# Patient Record
Sex: Female | Born: 1960 | Race: White | Hispanic: No | Marital: Married | Smoking: Never smoker
Health system: Southern US, Community
[De-identification: ages and names within clinical notes are randomized; demographics above are authoritative.]

## PROBLEM LIST (undated history)

## (undated) DIAGNOSIS — Z923 Personal history of irradiation: Secondary | ICD-10-CM

## (undated) DIAGNOSIS — C50919 Malignant neoplasm of unspecified site of unspecified female breast: Secondary | ICD-10-CM

## (undated) DIAGNOSIS — E7800 Pure hypercholesterolemia, unspecified: Secondary | ICD-10-CM

## (undated) DIAGNOSIS — K219 Gastro-esophageal reflux disease without esophagitis: Secondary | ICD-10-CM

## (undated) DIAGNOSIS — Z8042 Family history of malignant neoplasm of prostate: Secondary | ICD-10-CM

## (undated) DIAGNOSIS — I10 Essential (primary) hypertension: Secondary | ICD-10-CM

## (undated) DIAGNOSIS — C539 Malignant neoplasm of cervix uteri, unspecified: Secondary | ICD-10-CM

## (undated) DIAGNOSIS — Z803 Family history of malignant neoplasm of breast: Secondary | ICD-10-CM

## (undated) HISTORY — DX: Family history of malignant neoplasm of breast: Z80.3

## (undated) HISTORY — PX: BREAST LUMPECTOMY: SHX2

## (undated) HISTORY — PX: EYE SURGERY: SHX253

## (undated) HISTORY — DX: Family history of malignant neoplasm of prostate: Z80.42

---
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
## 1988-12-07 HISTORY — PX: ABDOMINAL HYSTERECTOMY: SHX81

## 1999-09-23 ENCOUNTER — Encounter: Payer: Self-pay | Admitting: Obstetrics and Gynecology

## 1999-09-23 ENCOUNTER — Ambulatory Visit (HOSPITAL_COMMUNITY): Admission: RE | Admit: 1999-09-23 | Discharge: 1999-09-23 | Payer: Self-pay

## 1999-09-25 ENCOUNTER — Encounter: Payer: Self-pay | Admitting: Obstetrics and Gynecology

## 1999-09-25 ENCOUNTER — Ambulatory Visit (HOSPITAL_COMMUNITY): Admission: RE | Admit: 1999-09-25 | Discharge: 1999-09-25 | Payer: Self-pay

## 2000-07-29 ENCOUNTER — Ambulatory Visit (HOSPITAL_COMMUNITY): Admission: RE | Admit: 2000-07-29 | Discharge: 2000-07-29 | Payer: Self-pay

## 2001-05-05 ENCOUNTER — Encounter: Payer: Self-pay | Admitting: Obstetrics and Gynecology

## 2001-05-05 ENCOUNTER — Ambulatory Visit (HOSPITAL_COMMUNITY): Admission: RE | Admit: 2001-05-05 | Discharge: 2001-05-05 | Payer: Self-pay | Admitting: Obstetrics and Gynecology

## 2002-02-02 ENCOUNTER — Ambulatory Visit (HOSPITAL_COMMUNITY): Admission: RE | Admit: 2002-02-02 | Discharge: 2002-02-02 | Payer: Self-pay | Admitting: Obstetrics and Gynecology

## 2002-02-02 ENCOUNTER — Encounter: Payer: Self-pay | Admitting: Obstetrics and Gynecology

## 2003-03-08 ENCOUNTER — Ambulatory Visit (HOSPITAL_COMMUNITY): Admission: RE | Admit: 2003-03-08 | Discharge: 2003-03-08 | Payer: Self-pay | Admitting: Obstetrics and Gynecology

## 2003-03-08 ENCOUNTER — Encounter: Payer: Self-pay | Admitting: Obstetrics and Gynecology

## 2004-04-30 ENCOUNTER — Ambulatory Visit (HOSPITAL_COMMUNITY): Admission: RE | Admit: 2004-04-30 | Discharge: 2004-04-30 | Payer: Self-pay | Admitting: *Deleted

## 2005-05-27 ENCOUNTER — Ambulatory Visit (HOSPITAL_COMMUNITY): Admission: RE | Admit: 2005-05-27 | Discharge: 2005-05-27 | Payer: Self-pay | Admitting: *Deleted

## 2005-08-07 ENCOUNTER — Ambulatory Visit (HOSPITAL_COMMUNITY): Admission: RE | Admit: 2005-08-07 | Discharge: 2005-08-07 | Payer: Self-pay | Admitting: *Deleted

## 2006-07-15 ENCOUNTER — Ambulatory Visit (HOSPITAL_COMMUNITY): Admission: RE | Admit: 2006-07-15 | Discharge: 2006-07-15 | Payer: Self-pay

## 2007-08-18 ENCOUNTER — Ambulatory Visit (HOSPITAL_COMMUNITY): Admission: RE | Admit: 2007-08-18 | Discharge: 2007-08-18 | Payer: Self-pay | Admitting: *Deleted

## 2008-08-24 ENCOUNTER — Ambulatory Visit (HOSPITAL_COMMUNITY): Admission: RE | Admit: 2008-08-24 | Discharge: 2008-08-24 | Payer: Self-pay | Admitting: *Deleted

## 2009-09-10 ENCOUNTER — Ambulatory Visit (HOSPITAL_COMMUNITY): Admission: RE | Admit: 2009-09-10 | Discharge: 2009-09-10 | Payer: Self-pay | Admitting: *Deleted

## 2009-09-16 ENCOUNTER — Encounter: Admission: RE | Admit: 2009-09-16 | Discharge: 2009-09-16 | Payer: Self-pay | Admitting: *Deleted

## 2010-11-10 ENCOUNTER — Ambulatory Visit (HOSPITAL_COMMUNITY)
Admission: RE | Admit: 2010-11-10 | Discharge: 2010-11-10 | Payer: Self-pay | Source: Home / Self Care | Admitting: Obstetrics and Gynecology

## 2010-12-28 ENCOUNTER — Encounter: Payer: Self-pay | Admitting: Obstetrics and Gynecology

## 2012-01-06 ENCOUNTER — Other Ambulatory Visit (HOSPITAL_COMMUNITY): Payer: Self-pay | Admitting: Obstetrics and Gynecology

## 2012-01-06 DIAGNOSIS — Z1231 Encounter for screening mammogram for malignant neoplasm of breast: Secondary | ICD-10-CM

## 2012-02-03 ENCOUNTER — Ambulatory Visit (HOSPITAL_COMMUNITY)
Admission: RE | Admit: 2012-02-03 | Discharge: 2012-02-03 | Disposition: A | Discharge status: Home / Self Care | Payer: 59 | Source: Ambulatory Visit | Attending: Obstetrics and Gynecology | Admitting: Obstetrics and Gynecology

## 2012-02-03 DIAGNOSIS — Z1231 Encounter for screening mammogram for malignant neoplasm of breast: Secondary | ICD-10-CM | POA: Insufficient documentation

## 2012-02-05 ENCOUNTER — Other Ambulatory Visit: Payer: Self-pay | Admitting: Obstetrics and Gynecology

## 2012-02-05 DIAGNOSIS — R928 Other abnormal and inconclusive findings on diagnostic imaging of breast: Secondary | ICD-10-CM

## 2012-02-16 ENCOUNTER — Ambulatory Visit
Admission: RE | Admit: 2012-02-16 | Discharge: 2012-02-16 | Disposition: A | Discharge status: Home / Self Care | Payer: 59 | Source: Ambulatory Visit | Attending: Obstetrics and Gynecology | Admitting: Obstetrics and Gynecology

## 2012-02-16 DIAGNOSIS — R928 Other abnormal and inconclusive findings on diagnostic imaging of breast: Secondary | ICD-10-CM

## 2013-03-07 ENCOUNTER — Other Ambulatory Visit (HOSPITAL_COMMUNITY): Payer: Self-pay | Admitting: Obstetrics and Gynecology

## 2013-03-07 DIAGNOSIS — Z1231 Encounter for screening mammogram for malignant neoplasm of breast: Secondary | ICD-10-CM

## 2013-03-10 ENCOUNTER — Ambulatory Visit (HOSPITAL_COMMUNITY)
Admission: RE | Admit: 2013-03-10 | Discharge: 2013-03-10 | Disposition: A | Discharge status: Home / Self Care | Payer: 59 | Source: Ambulatory Visit | Attending: Obstetrics and Gynecology | Admitting: Obstetrics and Gynecology

## 2013-03-10 DIAGNOSIS — Z1231 Encounter for screening mammogram for malignant neoplasm of breast: Secondary | ICD-10-CM | POA: Insufficient documentation

## 2014-03-12 ENCOUNTER — Other Ambulatory Visit (HOSPITAL_COMMUNITY): Payer: Self-pay | Admitting: Internal Medicine

## 2014-03-12 ENCOUNTER — Ambulatory Visit (HOSPITAL_COMMUNITY)
Admission: RE | Admit: 2014-03-12 | Discharge: 2014-03-12 | Disposition: A | Discharge status: Home / Self Care | Payer: BC Managed Care – PPO | Source: Ambulatory Visit | Attending: Internal Medicine | Admitting: Internal Medicine

## 2014-03-12 DIAGNOSIS — Z1231 Encounter for screening mammogram for malignant neoplasm of breast: Secondary | ICD-10-CM | POA: Insufficient documentation

## 2014-03-12 DIAGNOSIS — Z0000 Encounter for general adult medical examination without abnormal findings: Secondary | ICD-10-CM

## 2015-03-18 ENCOUNTER — Other Ambulatory Visit (HOSPITAL_COMMUNITY): Payer: Self-pay | Admitting: Internal Medicine

## 2015-03-18 DIAGNOSIS — Z1231 Encounter for screening mammogram for malignant neoplasm of breast: Secondary | ICD-10-CM

## 2015-03-22 ENCOUNTER — Ambulatory Visit (HOSPITAL_COMMUNITY)
Admission: RE | Admit: 2015-03-22 | Discharge: 2015-03-22 | Disposition: A | Discharge status: Home / Self Care | Payer: BLUE CROSS/BLUE SHIELD | Source: Ambulatory Visit | Attending: Internal Medicine | Admitting: Internal Medicine

## 2015-03-22 DIAGNOSIS — Z1231 Encounter for screening mammogram for malignant neoplasm of breast: Secondary | ICD-10-CM | POA: Diagnosis not present

## 2015-04-09 ENCOUNTER — Other Ambulatory Visit: Payer: Self-pay | Admitting: Registered Nurse

## 2015-04-09 ENCOUNTER — Other Ambulatory Visit (HOSPITAL_COMMUNITY)
Admission: RE | Admit: 2015-04-09 | Discharge: 2015-04-09 | Disposition: A | Discharge status: Home / Self Care | Payer: BLUE CROSS/BLUE SHIELD | Source: Ambulatory Visit | Attending: Internal Medicine | Admitting: Internal Medicine

## 2015-04-09 DIAGNOSIS — Z01419 Encounter for gynecological examination (general) (routine) without abnormal findings: Secondary | ICD-10-CM | POA: Insufficient documentation

## 2015-04-11 LAB — CYTOLOGY - PAP

## 2016-03-12 ENCOUNTER — Other Ambulatory Visit: Payer: Self-pay

## 2016-03-12 DIAGNOSIS — Z1231 Encounter for screening mammogram for malignant neoplasm of breast: Secondary | ICD-10-CM

## 2016-04-07 ENCOUNTER — Ambulatory Visit
Admission: RE | Admit: 2016-04-07 | Discharge: 2016-04-07 | Disposition: A | Discharge status: Home / Self Care | Payer: BLUE CROSS/BLUE SHIELD | Source: Ambulatory Visit

## 2016-04-07 DIAGNOSIS — Z1231 Encounter for screening mammogram for malignant neoplasm of breast: Secondary | ICD-10-CM

## 2016-04-20 DIAGNOSIS — B354 Tinea corporis: Secondary | ICD-10-CM | POA: Diagnosis not present

## 2016-06-18 DIAGNOSIS — Z0001 Encounter for general adult medical examination with abnormal findings: Secondary | ICD-10-CM | POA: Diagnosis not present

## 2016-06-24 DIAGNOSIS — B354 Tinea corporis: Secondary | ICD-10-CM | POA: Diagnosis not present

## 2016-06-24 DIAGNOSIS — E7800 Pure hypercholesterolemia, unspecified: Secondary | ICD-10-CM | POA: Diagnosis not present

## 2016-06-24 DIAGNOSIS — Z8541 Personal history of malignant neoplasm of cervix uteri: Secondary | ICD-10-CM | POA: Diagnosis not present

## 2016-06-24 DIAGNOSIS — Z0000 Encounter for general adult medical examination without abnormal findings: Secondary | ICD-10-CM | POA: Diagnosis not present

## 2016-07-08 ENCOUNTER — Other Ambulatory Visit (HOSPITAL_COMMUNITY)
Admission: RE | Admit: 2016-07-08 | Discharge: 2016-07-08 | Disposition: A | Discharge status: Home / Self Care | Payer: BLUE CROSS/BLUE SHIELD | Source: Ambulatory Visit | Attending: Internal Medicine | Admitting: Internal Medicine

## 2016-07-08 ENCOUNTER — Other Ambulatory Visit: Payer: Self-pay | Admitting: Registered Nurse

## 2016-07-08 DIAGNOSIS — Z01419 Encounter for gynecological examination (general) (routine) without abnormal findings: Secondary | ICD-10-CM | POA: Diagnosis not present

## 2016-07-08 DIAGNOSIS — Z1212 Encounter for screening for malignant neoplasm of rectum: Secondary | ICD-10-CM | POA: Diagnosis not present

## 2016-07-08 DIAGNOSIS — Z01411 Encounter for gynecological examination (general) (routine) with abnormal findings: Secondary | ICD-10-CM | POA: Insufficient documentation

## 2016-07-08 DIAGNOSIS — Z79890 Hormone replacement therapy (postmenopausal): Secondary | ICD-10-CM | POA: Diagnosis not present

## 2016-07-08 DIAGNOSIS — Z8541 Personal history of malignant neoplasm of cervix uteri: Secondary | ICD-10-CM | POA: Diagnosis not present

## 2016-07-15 LAB — CYTOLOGY - PAP

## 2016-08-27 DIAGNOSIS — E7800 Pure hypercholesterolemia, unspecified: Secondary | ICD-10-CM | POA: Diagnosis not present

## 2016-09-18 DIAGNOSIS — Z23 Encounter for immunization: Secondary | ICD-10-CM | POA: Diagnosis not present

## 2016-12-07 DIAGNOSIS — Z923 Personal history of irradiation: Secondary | ICD-10-CM

## 2016-12-07 DIAGNOSIS — Z9221 Personal history of antineoplastic chemotherapy: Secondary | ICD-10-CM

## 2016-12-07 HISTORY — DX: Personal history of antineoplastic chemotherapy: Z92.21

## 2016-12-07 HISTORY — DX: Personal history of irradiation: Z92.3

## 2017-02-04 DIAGNOSIS — I10 Essential (primary) hypertension: Secondary | ICD-10-CM | POA: Diagnosis not present

## 2017-02-04 DIAGNOSIS — B009 Herpesviral infection, unspecified: Secondary | ICD-10-CM | POA: Diagnosis not present

## 2017-03-03 ENCOUNTER — Other Ambulatory Visit: Payer: Self-pay | Admitting: Internal Medicine

## 2017-03-03 DIAGNOSIS — Z1231 Encounter for screening mammogram for malignant neoplasm of breast: Secondary | ICD-10-CM

## 2017-03-04 DIAGNOSIS — I10 Essential (primary) hypertension: Secondary | ICD-10-CM | POA: Diagnosis not present

## 2017-04-06 DIAGNOSIS — C50919 Malignant neoplasm of unspecified site of unspecified female breast: Secondary | ICD-10-CM

## 2017-04-06 HISTORY — DX: Malignant neoplasm of unspecified site of unspecified female breast: C50.919

## 2017-04-07 DIAGNOSIS — F419 Anxiety disorder, unspecified: Secondary | ICD-10-CM | POA: Diagnosis not present

## 2017-04-07 DIAGNOSIS — E7800 Pure hypercholesterolemia, unspecified: Secondary | ICD-10-CM | POA: Diagnosis not present

## 2017-04-07 DIAGNOSIS — L237 Allergic contact dermatitis due to plants, except food: Secondary | ICD-10-CM | POA: Diagnosis not present

## 2017-04-07 DIAGNOSIS — I10 Essential (primary) hypertension: Secondary | ICD-10-CM | POA: Diagnosis not present

## 2017-04-08 ENCOUNTER — Ambulatory Visit
Admission: RE | Admit: 2017-04-08 | Discharge: 2017-04-08 | Disposition: A | Discharge status: Home / Self Care | Payer: BLUE CROSS/BLUE SHIELD | Source: Ambulatory Visit | Attending: Internal Medicine | Admitting: Internal Medicine

## 2017-04-08 DIAGNOSIS — Z1231 Encounter for screening mammogram for malignant neoplasm of breast: Secondary | ICD-10-CM | POA: Diagnosis not present

## 2017-04-09 ENCOUNTER — Other Ambulatory Visit: Payer: Self-pay | Admitting: Internal Medicine

## 2017-04-09 DIAGNOSIS — R928 Other abnormal and inconclusive findings on diagnostic imaging of breast: Secondary | ICD-10-CM

## 2017-04-14 ENCOUNTER — Ambulatory Visit
Admission: RE | Admit: 2017-04-14 | Discharge: 2017-04-14 | Disposition: A | Discharge status: Home / Self Care | Payer: BLUE CROSS/BLUE SHIELD | Source: Ambulatory Visit | Attending: Internal Medicine | Admitting: Internal Medicine

## 2017-04-14 ENCOUNTER — Other Ambulatory Visit: Payer: Self-pay | Admitting: Internal Medicine

## 2017-04-14 DIAGNOSIS — R922 Inconclusive mammogram: Secondary | ICD-10-CM | POA: Diagnosis not present

## 2017-04-14 DIAGNOSIS — R590 Localized enlarged lymph nodes: Secondary | ICD-10-CM | POA: Diagnosis not present

## 2017-04-14 DIAGNOSIS — N6489 Other specified disorders of breast: Secondary | ICD-10-CM | POA: Diagnosis not present

## 2017-04-14 DIAGNOSIS — R599 Enlarged lymph nodes, unspecified: Secondary | ICD-10-CM

## 2017-04-14 DIAGNOSIS — R928 Other abnormal and inconclusive findings on diagnostic imaging of breast: Secondary | ICD-10-CM

## 2017-04-14 DIAGNOSIS — C801 Malignant (primary) neoplasm, unspecified: Secondary | ICD-10-CM | POA: Diagnosis not present

## 2017-04-14 DIAGNOSIS — C773 Secondary and unspecified malignant neoplasm of axilla and upper limb lymph nodes: Secondary | ICD-10-CM | POA: Diagnosis not present

## 2017-04-20 ENCOUNTER — Other Ambulatory Visit: Payer: Self-pay | Admitting: General Surgery

## 2017-04-20 DIAGNOSIS — C773 Secondary and unspecified malignant neoplasm of axilla and upper limb lymph nodes: Principal | ICD-10-CM

## 2017-04-20 DIAGNOSIS — C50912 Malignant neoplasm of unspecified site of left female breast: Secondary | ICD-10-CM

## 2017-04-21 ENCOUNTER — Encounter: Payer: Self-pay | Admitting: Hematology and Oncology

## 2017-04-21 ENCOUNTER — Ambulatory Visit (HOSPITAL_BASED_OUTPATIENT_CLINIC_OR_DEPARTMENT_OTHER): Payer: BLUE CROSS/BLUE SHIELD | Admitting: Hematology and Oncology

## 2017-04-21 DIAGNOSIS — C773 Secondary and unspecified malignant neoplasm of axilla and upper limb lymph nodes: Secondary | ICD-10-CM

## 2017-04-21 DIAGNOSIS — C50912 Malignant neoplasm of unspecified site of left female breast: Secondary | ICD-10-CM

## 2017-04-21 DIAGNOSIS — C50919 Malignant neoplasm of unspecified site of unspecified female breast: Secondary | ICD-10-CM | POA: Diagnosis not present

## 2017-04-21 DIAGNOSIS — Z170 Estrogen receptor positive status [ER+]: Secondary | ICD-10-CM | POA: Diagnosis not present

## 2017-04-21 MED ORDER — ANASTROZOLE 1 MG PO TABS: 1.0000 mg | ORAL_TABLET | Freq: Every day | ORAL | 3 refills | Status: DC

## 2017-04-21 NOTE — Assessment & Plan Note (Signed)
04/14/2017 Left axillary lymph node biopsy: Metastatic carcinoma positive for CK 7, ER positive,GATA-3 equivocal, negative for CK 20,GCDFP, CDX-2, TTF-1; mammogram and ultrasound revealed 3 discrete abnormal left axillary lymph nodes largest measures 1.8 cm Clinical stage: TX N1 MX Pathology and radiology counseling: Discussed with the patient, the details of pathology including the type of breast cancer,the clinical staging, the significance of ER, PR and HER-2/neu receptors and the implications for treatment. After reviewing the pathology in detail, we proceeded to discuss the different treatment options between surgery, radiation, chemotherapy, antiestrogen therapies.  Recommendation: Will perform staging CT and bone scans 1. Breast MRI to be done 04/22/2017 2. await the results of complete breast prognostic panel 3. Surgery either with lumpectomy or mastectomy depending on MRI 4. Followed by Mammaprint testing if HER-2 negative 5. Followed by adjuvant radiation 6. Followed by adjuvant antiestrogen therapy   We discussed the role of neoadjuvant versus adjuvant chemotherapy. We will discuss this in the tumor board after reviewing the breast MRI and then come up with the final treatment plan.

## 2017-04-21 NOTE — Progress Notes (Signed)
Location of Breast Cancer: Left Axillary Lymph node  Histology per Pathology Report: 04/14/2017 Left axillary lymph node biopsy: Metastatic carcinoma positive for CK 7, ER positive,GATA-3 equivocal, negative for CK 20,GCDFP, CDX-2, TTF-1; mammogram and ultrasound revealed 3 discrete abnormal left axillary lymph nodes largest measures 1.8 cm Clinical stage: TX N1 MX  04/14/17 Diagnosis Lymph node, needle/core biopsy, left axillary - METASTATIC CARCINOMA.  Receptor Status: ER(POS), PR (), Her2-neu (), Ki-()  Did patient present with symptoms or was this found on screening mammography?: The lymph node was found on a screening mammogram.   Past/Anticipated interventions by surgeon, if any: Dr. Donne Hazel   Past/Anticipated interventions by medical oncology, if any: Dr. Lindi Adie 04/21/17 Recommendation: Will perform staging CT and bone scans 1. Breast MRI to be done 04/22/2017 2. await the results of complete breast prognostic panel 3. Surgery either with lumpectomy or mastectomy depending on MRI 4. Followed by Mammaprint testing if HER-2 negative 5. Followed by adjuvant radiation 6. Followed by adjuvant antiestrogen therapy   If however Dr. Donne Hazel request neoadjuvant treatment, we can do the Mammaprint on the biopsy and then determine if she needs chemotherapy or antiestrogen therapy upfront. We discussed the role of neoadjuvant versus adjuvant chemotherapy. Because it will take at least with 3 weeks to surgery, I started the patient on anastrozole 1 mg daily from today. I sent a prescription to her pharmacy. We discussed the risks and benefits of anastrozole therapy including hot flashes or myalgias as well as risk of osteoporosis mood swings and vaginal dryness.  We will discuss this in the tumor board after reviewing the breast MRI and then come up with the final treatment plan.   Lymphedema issues, if any:  N/A  Pain issues, if any: She denies.   SAFETY ISSUES:  Prior  radiation? no  Pacemaker/ICD? No  Possible current pregnancy? No  Is the patient on methotrexate? No  Current Complaints / other details:   MRI Breast bilateral 04/22/17  CT abdomen, pelvis and chest 04/26/17 Bone scan 04/30/17  Patient took estrogen replacement therapy for the past 8 years or so since she started getting menopausal symptoms at age 74. She quit taking it couple of months ago.  BP (!) 154/85   Pulse 86   Temp 97.6 F (36.4 C)   Wt 159 lb 6.4 oz (72.3 kg)   SpO2 96% Comment: room air  BMI 24.24 kg/m    Wt Readings from Last 3 Encounters:  04/23/17 159 lb 6.4 oz (72.3 kg)  04/21/17 161 lb 8 oz (73.3 kg)      Melissa [name], Stephani Police, RN 04/21/2017,9:14 AM

## 2017-04-21 NOTE — Progress Notes (Addendum)
[facility] CONSULT NOTE  Patient Care Team: Deland Pretty, MD as PCP - General (Internal Medicine)  CHIEF COMPLAINTS/PURPOSE OF CONSULTATION:  Newly diagnosed breast cancer  HISTORY OF PRESENTING ILLNESS:  Melissa [name] 56 y.o. female is here because of recent diagnosis of left axillary lymph node metastases most likely breast origin. Patient presented for a routine screening mammogram the detected these left axillary lymph nodes. She then underwent ultrasound and biopsy which came back as metastatic carcinoma that is breast origin. The initial mammogram and ultrasound did not show anything in the breast. She is scheduled to undergo breast MRI tomorrow. Patient has been going through a normal stress recently. She was dealing with some aliasing in loss as well as her daughter having had pregnancy loss. She was going through menopausal symptoms with severe hot flashes and difficulty with sleeping. Her primary care physician had prescribed Effexor but she has not started it yet. She saw Dr. Donne Hazel who has ordered a breast MRI tomorrow and we will be presenting her case in the tumor board next Wednesday. Patient took estrogen replacement therapy for the past 8 years or so since she started getting menopausal symptoms at age 76. She quit taking it couple of months ago. I reviewed her records extensively and collaborated the history with the patient.  SUMMARY OF ONCOLOGIC HISTORY:   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[place])   04/14/2017 Initial Diagnosis    Left axillary lymph node biopsy: Metastatic carcinoma positive for CK 7, ER positive,GATA-3 equivocal, negative for CK 20,GCDFP, CDX-2, TTF-1; HER-2 positive ratio 2.08; mammogram and ultrasound revealed 3 discrete abnormal left axillary lymph nodes largest measures 1.8 cm      04/23/2017 Breast MRI    Left axillary lymphadenopathy numerous enlarged level I axillary lymph nodes  largest 2.1 cm, no other abdominal masses in the left breast itself, no MRI evidence of malignancy in the right breast      04/26/2017 Imaging    CT CAP: Prominent left axillary lymph nodes no distant metastases, sclerotic focus left pedicle of T9 vertebra favored benign, T5 vertebral hemangioma       MEDICAL HISTORY: Anxiety, menopausal disorder with severe hot flashes SURGICAL HISTORY:No prior surgeries SOCIAL HISTORY:Denies any tobacco or alcohol or recreational drug use. She has occasional alcohol use history. FAMILY HISTORY:No breast cancer or ovarian cancer in the family ALLERGIES: No known drug allergies MEDICATIONS: Triamterene-HCTZ 30 7. 5-25 milligrams, rosuvastatin 10 mg daily, vitamin D, calcium Current Outpatient Prescriptions  Medication Sig Dispense Refill  . ALPRAZolam (XANAX) 0.5 MG tablet Take 0.5 mg by mouth 3 (three) times daily.  0  . anastrozole (ARIMIDEX) 1 MG tablet Take 1 tablet (1 mg total) by mouth daily. 90 tablet 3  . calcium carbonate (OS-CAL) 1250 (500 Ca) MG chewable tablet Chew 1 tablet by mouth daily. Calcium 600 mg    . cholecalciferol (VITAMIN D) 1000 units tablet Take 1,000 Units by mouth daily. 2000 iu    . dexamethasone (DECADRON) 4 MG tablet Take 1 tablet (4 mg total) by mouth 2 (two) times daily. Take 1 tab day before chemo and 1 tab day after chemo. 30 tablet 1  . lidocaine-prilocaine (EMLA) cream Apply to affected area once 30 g 3  . LORazepam (ATIVAN) 0.5 MG tablet Take 1 tablet (0.5 mg total) by mouth at bedtime. 30 tablet 0  . ondansetron (ZOFRAN) 8 MG tablet Take 1 tablet (8 mg total) by mouth 2 (two)  times daily as needed for refractory nausea / vomiting. Start on day 3 after chemo. 30 tablet 1  . predniSONE (STERAPRED UNI-PAK 21 TAB) 10 MG (21) TBPK tablet   0  . prochlorperazine (COMPAZINE) 10 MG tablet Take 1 tablet (10 mg total) by mouth every 6 (six) hours as needed (Nausea or vomiting). 30 tablet 1  . rosuvastatin (CRESTOR) 10 MG tablet    0  . triamterene-hydrochlorothiazide (MAXZIDE-25) 37.5-25 MG tablet take 1/2 to 1 tablet by mouth daily IN THE MORNING  0  . valACYclovir (VALTREX) 1000 MG tablet take 2 tablets by mouth every 12 hours if needed for 10 days  0  . venlafaxine XR (EFFEXOR-XR) 37.5 MG 24 hr capsule TK 1 C PO QD WF  0   No current facility-administered medications for this visit.     REVIEW OF SYSTEMS:   Constitutional: Denies fevers, chills or abnormal night sweats Eyes: Denies blurriness of vision, double vision or watery eyes Ears, nose, mouth, throat, and face: Denies mucositis or sore throat Respiratory: Denies cough, dyspnea or wheezes Cardiovascular: Denies palpitation, chest discomfort or lower extremity swelling Gastrointestinal:  Denies nausea, heartburn or change in bowel habits Skin: Denies abnormal skin rashes Lymphatics: Denies new lymphadenopathy or easy bruising Neurological:Denies numbness, tingling or new weaknesses Behavioral/Psych: Anxiety and stress  Breast:  Denies any palpable lumps or discharge All other systems were reviewed with the patient and are negative.  PHYSICAL EXAMINATION: ECOG PERFORMANCE STATUS: 1 - Symptomatic but completely ambulatory  Vitals:   04/21/17 1455  BP: (!) 144/77  Pulse: 75  Resp: 18  Temp: 97.8 F (36.6 C)   Filed Weights   04/21/17 1455  Weight: 161 lb 8 oz (73.3 kg)    GENERAL:alert, no distress and comfortable SKIN: skin color, texture, turgor are normal, no rashes or significant lesions EYES: normal, conjunctiva are pink and non-injected, sclera clear OROPHARYNX:no exudate, no erythema and lips, buccal mucosa, and tongue normal  NECK: supple, thyroid normal size, non-tender, without nodularity LYMPH:  no palpable lymphadenopathy in the cervical, axillary or inguinal LUNGS: clear to auscultation and percussion with normal breathing effort HEART: regular rate & rhythm and no murmurs and no lower extremity edema ABDOMEN:abdomen soft,  non-tender and normal bowel sounds Musculoskeletal:no cyanosis of digits and no clubbing  PSYCH: alert & oriented x 3 with fluent speech NEURO: no focal motor/sensory deficits BREAST: No palpable nodules in breast. Palpable axillary lymph nodes (exam performed in the presence of a chaperone)   RADIOGRAPHIC STUDIES: I have personally reviewed the radiological reports and agreed with the findings in the report.  ASSESSMENT AND PLAN:  Breast cancer, left breast ([place]) 04/14/2017 Left axillary lymph node biopsy: Metastatic carcinoma positive for CK 7, ER positive,GATA-3 equivocal, negative for CK 20,GCDFP, CDX-2, TTF-1; mammogram and ultrasound revealed 3 discrete abnormal left axillary lymph nodes largest measures 1.8 cm Clinical stage: TX N1 MX Pathology and radiology counseling: Discussed with the patient, the details of pathology including the type of breast cancer,the clinical staging, the significance of ER, PR and HER-2/neu receptors and the implications for treatment. After reviewing the pathology in detail, we proceeded to discuss the different treatment options between surgery, radiation, chemotherapy, antiestrogen therapies.  Recommendation: Will perform staging CT and bone scans 1. Breast MRI to be done 04/22/2017 2. await the results of complete breast prognostic panel 3. Surgery either with lumpectomy or mastectomy depending on MRI 4. Followed by Mammaprint testing if HER-2 negative 5. Followed by adjuvant radiation 6. Followed by  adjuvant antiestrogen therapy   If however Dr. Donne Hazel request neoadjuvant treatment, we can do the Mammaprint on the biopsy and then determine if she needs chemotherapy or antiestrogen therapy upfront. We discussed the role of neoadjuvant versus adjuvant chemotherapy. Because it will take at least with 3 weeks to surgery, I started the patient on anastrozole 1 mg daily from today. I sent a prescription to her pharmacy. We discussed the risks and  benefits of anastrozole therapy including hot flashes or myalgias as well as risk of osteoporosis mood swings and vaginal dryness.  We will discuss this in the tumor board after reviewing the breast MRI and then come up with the final treatment plan.  All questions were answered. The patient knows to call the clinic with any problems, questions or concerns.    Rulon Eisenmenger, MD 05/12/17   ADDENDUM: The addendum is to state that the pathology report confirmed metastatic carcinoma but the MRI did not confirm a breast primary. However because of the location of the lymph node which is in the axilla and it being ER positive and HER-2 positive ER extremely confident that this is most likely breast origin. Because of the above issues, I changed the diagnosis to secondary and unspecified malignant neoplasm of the axilla and upper limb lymph nodes (ICD code C 77.3) as well as malignant neoplasm of unspecified site of unspecified female breast (ICD code C 15.919) The treatment of [place] is based upon NCCN guidelines for node positive HER-2 positive breast neoplasm to be given as neoadjuvant chemotherapy. This establishes her stage as stage II a clinical staging.

## 2017-04-22 ENCOUNTER — Other Ambulatory Visit: Payer: Self-pay | Admitting: Emergency Medicine

## 2017-04-22 ENCOUNTER — Ambulatory Visit
Admission: RE | Admit: 2017-04-22 | Discharge: 2017-04-22 | Disposition: A | Discharge status: Home / Self Care | Payer: BLUE CROSS/BLUE SHIELD | Source: Ambulatory Visit | Attending: General Surgery | Admitting: General Surgery

## 2017-04-22 DIAGNOSIS — C50912 Malignant neoplasm of unspecified site of left female breast: Secondary | ICD-10-CM

## 2017-04-22 DIAGNOSIS — C773 Secondary and unspecified malignant neoplasm of axilla and upper limb lymph nodes: Principal | ICD-10-CM

## 2017-04-22 DIAGNOSIS — N6489 Other specified disorders of breast: Secondary | ICD-10-CM | POA: Diagnosis not present

## 2017-04-22 MED ORDER — GADOBENATE DIMEGLUMINE 529 MG/ML IV SOLN
15.0000 mL | Freq: Once | INTRAVENOUS | Status: AC | PRN
  Administered 2017-04-22: 15 mL via INTRAVENOUS

## 2017-04-23 ENCOUNTER — Ambulatory Visit
Admission: RE | Admit: 2017-04-23 | Discharge: 2017-04-23 | Disposition: A | Discharge status: Home / Self Care | Payer: BLUE CROSS/BLUE SHIELD | Source: Ambulatory Visit | Attending: Radiation Oncology | Admitting: Radiation Oncology

## 2017-04-23 ENCOUNTER — Encounter: Payer: Self-pay | Admitting: Radiation Oncology

## 2017-04-23 ENCOUNTER — Telehealth: Payer: Self-pay | Admitting: *Deleted

## 2017-04-23 VITALS — BP 154/85 | HR 86 | Temp 97.6°F | Wt 159.4 lb

## 2017-04-23 DIAGNOSIS — C50912 Malignant neoplasm of unspecified site of left female breast: Secondary | ICD-10-CM | POA: Diagnosis not present

## 2017-04-23 DIAGNOSIS — C773 Secondary and unspecified malignant neoplasm of axilla and upper limb lymph nodes: Secondary | ICD-10-CM | POA: Diagnosis not present

## 2017-04-23 DIAGNOSIS — Z8541 Personal history of malignant neoplasm of cervix uteri: Secondary | ICD-10-CM | POA: Insufficient documentation

## 2017-04-23 DIAGNOSIS — I10 Essential (primary) hypertension: Secondary | ICD-10-CM | POA: Diagnosis not present

## 2017-04-23 DIAGNOSIS — R21 Rash and other nonspecific skin eruption: Secondary | ICD-10-CM | POA: Diagnosis not present

## 2017-04-23 DIAGNOSIS — E7800 Pure hypercholesterolemia, unspecified: Secondary | ICD-10-CM | POA: Diagnosis not present

## 2017-04-23 DIAGNOSIS — C801 Malignant (primary) neoplasm, unspecified: Secondary | ICD-10-CM | POA: Diagnosis not present

## 2017-04-23 HISTORY — DX: Malignant neoplasm of cervix uteri, unspecified: C53.9

## 2017-04-23 HISTORY — DX: Pure hypercholesterolemia, unspecified: E78.00

## 2017-04-23 HISTORY — DX: Essential (primary) hypertension: I10

## 2017-04-23 NOTE — Telephone Encounter (Signed)
Left message for a return phone call to follow up after new patient appointment and to give appt for bone scan

## 2017-04-23 NOTE — Progress Notes (Signed)
Radiation Oncology         ([phone]) [phone] ________________________________  Initial outpatient Consultation  Name: Melissa [name] MRN: [id number]  Date: 04/23/2017  DOB: 1961/07/30  PZ:WCHEN, Thayer Jew, MD  Rolm Bookbinder, MD   REFERRING PHYSICIAN: Rolm Bookbinder, MD  DIAGNOSIS:    ICD-9-CM ICD-10-CM   1. Malignant neoplasm of left female breast, unspecified estrogen receptor status, unspecified site of breast ([place]) 174.9 C50.912 Ambulatory referral to Social Work  2. Secondary malignancy of axillary node ([facility]) 196.3 C77.3    Stage Tx N1 Mx Left Axillary Metastatic Carcinoma, presumed breast primary , ER 40% Positive / PR Negative / Her2 Positive by FISH  CHIEF COMPLAINT: Here to discuss management of left axillary lymph nodes positive for metastatic carcinoma, likely of breast primary  HISTORY OF PRESENT ILLNESS::Melissa [name] is a 56 y.o. female who presented for routine screening mammogram which detected 3 abnormal left axillary lymph nodes. She never felt a lump before this. She subsequently underwent ultrasound and biopsy of the left axillary lymph nodes on 04/14/17, which revealed metastatic carcinoma strongly favoring breast primary. This was ER 40% positive, weak staining. It was PR negative.  The patient underwent breast MRI for staging purposes on 04/22/17. The results are not posted at this time.  The patient is scheduled to be presented at tumor board on 04/28/17. The patient is also scheduled for CT staging on 5/21, bone scan on 5/25, and sees Dr. Lindi Adie on 5/25.  The patient presents to the clinic today to discuss the role that radiation therapy may play in the treatment of her disease. She is accompanied today by a close friend.   On review of systems, the patient denies pain. She denies headaches or abnormal extremity weakness. She reports regular bowel and urinary activity. She rpeorts on ongoing rash across her back, abdomen, and legs from poison sumac for which she  is currently taking Prednisone.  Of note, the patient took estrogen replacement therapy for the past 8 years since she began having menopausal symptoms at age 8. She stopped taking this several months ago. The patient had hysterectomy at age 76 due to very early stage endometrial cancer. She has a family history of breast cancer in her maternal aunt, post menopause.  PREVIOUS RADIATION THERAPY: No  PAST MEDICAL HISTORY:  has a past medical history of Cervical cancer ([place]); Hypercholesteremia; and Hypertension.    PAST SURGICAL HISTORY: Past Surgical History:  Procedure Laterality Date  . ABDOMINAL HYSTERECTOMY  1990    FAMILY HISTORY: family history is not on file.  SOCIAL HISTORY:  reports that she has never smoked. She has never used smokeless tobacco. She reports that she drinks alcohol. She reports that she does not use drugs.  ALLERGIES: Patient has no known allergies.  MEDICATIONS:  Current Outpatient Prescriptions  Medication Sig Dispense Refill  . ALPRAZolam (XANAX) 0.5 MG tablet Take 0.5 mg by mouth 3 (three) times daily.  0  . anastrozole (ARIMIDEX) 1 MG tablet Take 1 tablet (1 mg total) by mouth daily. 90 tablet 3  . calcium carbonate (OS-CAL) 1250 (500 Ca) MG chewable tablet Chew 1 tablet by mouth daily. Calcium 600 mg    . cholecalciferol (VITAMIN D) 1000 units tablet Take 1,000 Units by mouth daily. 2000 iu    . predniSONE (STERAPRED UNI-PAK 21 TAB) 10 MG (21) TBPK tablet   0  . rosuvastatin (CRESTOR) 10 MG tablet   0  . triamterene-hydrochlorothiazide (MAXZIDE-25) 37.5-25 MG tablet take 1/2 to 1 tablet  by mouth daily IN THE MORNING  0  . valACYclovir (VALTREX) 1000 MG tablet take 2 tablets by mouth every 12 hours if needed for 10 days  0  . venlafaxine XR (EFFEXOR-XR) 37.5 MG 24 hr capsule TK 1 C PO QD WF  0   No current facility-administered medications for this encounter.     REVIEW OF SYSTEMS: A 10+ POINT REVIEW OF SYSTEMS WAS OBTAINED including neurology,  dermatology, psychiatry, cardiac, respiratory, lymph, extremities, GI, GU, Musculoskeletal, constitutional, breasts, reproductive, HEENT.  All pertinent positives are noted in the HPI.  All others are negative.   PHYSICAL EXAM:  weight is 159 lb 6.4 oz (72.3 kg). Her temperature is 97.6 F (36.4 C). Her blood pressure is 154/85 (abnormal) and her pulse is 86. Her oxygen saturation is 96%.   General: Alert and oriented, in no acute distress. HEENT: Oropharynx is clear. Neck: Neck is supple, no palpable cervical or supraclavicular lymphadenopathy. Heart: Regular in rate and rhythm with no murmurs. Chest: Clear to auscultation bilaterally. Abdomen: Soft, non tender, non distended. Extremities: No edema. Lymphatics: see Neck Exam Skin: An erythematous speckled rash throughout the abdomen bilaterally, on legs and back consistent with allergic reaction. Neurologic: No obvious focalities. Speech is fluent. Coordination is intact. Psychiatric: Judgment and insight are intact. Affect is appropriate. Breasts: Right breast shows no palpable masses in the breast or axilla. Left breast shows a few erythematous splotches in left breast, consistant with spread of rash from abdomen, not a clinical concern. There is a slightly enlarged lymph node in left axilla that is approximately 1 cm in greatest dimension, though it is difficult to gauge with certainty.  ECOG = 0   LABORATORY DATA:  No results found for: WBC, HGB, HCT, MCV, PLT CMP  No results found for: NA, K, CL, CO2, GLUCOSE, BUN, CREATININE, CALCIUM, PROT, ALBUMIN, AST, ALT, ALKPHOS, BILITOT, GFRNONAA, GFRAA       RADIOGRAPHY: as above    IMPRESSION/PLAN: This is a delightful 56 y.o. woman with metastatic carcinoma to the left axillary lymph nodes, of presumed reast primary.  It was a pleasure meeting the patient today. We discussed the risks, benefits, and side effects of radiotherapy. I recommend radiotherapy to the left axilla, SCV fossa,  and breast or chest wall to reduce her risk of locoregional recurrence by 2/3.  We discussed that radiation would take approximately 5-6 weeks to complete and that I would give the patient a few weeks to heal following surgery before starting treatment planning. If chemotherapy were to be given, this would precede radiotherapy. We spoke about acute effects including skin irritation and fatigue as well as much less common late effects including internal organ injury or irritation. We spoke about the latest technology that is used to minimize the risk of late effects for patients undergoing radiotherapy to the breast or chest wall. No guarantees of treatment were given. The patient is enthusiastic about proceeding with treatment. A consent form was discussed, signed, and placed in the patient's chart. I look forward to participating in the patient's care.  I will await her referral back to me for postoperative follow-up and eventual CT simulation/treatment planning.  The patient is scheduled to be presented at tumor board on 04/28/17. The patient is also scheduled for CT staging on 5/21, bone scan on 5/25, and sees Dr. Lindi Adie on 5/25. MRI results of breasts - pending.      __________________________________________   Eppie Gibson, MD  This document serves as a record of  services personally performed by Eppie Gibson, MD. It was created on her behalf by Maryla Morrow, a trained medical scribe. The creation of this record is based on the scribe's personal observations and the provider's statements to them. This document has been checked and approved by the attending provider.

## 2017-04-26 ENCOUNTER — Ambulatory Visit (HOSPITAL_COMMUNITY)
Admission: RE | Admit: 2017-04-26 | Discharge: 2017-04-26 | Disposition: A | Discharge status: Home / Self Care | Payer: BLUE CROSS/BLUE SHIELD | Source: Ambulatory Visit | Attending: Hematology and Oncology | Admitting: Hematology and Oncology

## 2017-04-26 ENCOUNTER — Telehealth: Payer: Self-pay

## 2017-04-26 DIAGNOSIS — D1809 Hemangioma of other sites: Secondary | ICD-10-CM | POA: Diagnosis not present

## 2017-04-26 DIAGNOSIS — I700 Atherosclerosis of aorta: Secondary | ICD-10-CM | POA: Insufficient documentation

## 2017-04-26 DIAGNOSIS — C50912 Malignant neoplasm of unspecified site of left female breast: Secondary | ICD-10-CM | POA: Insufficient documentation

## 2017-04-26 DIAGNOSIS — R590 Localized enlarged lymph nodes: Secondary | ICD-10-CM | POA: Insufficient documentation

## 2017-04-26 DIAGNOSIS — M898X8 Other specified disorders of bone, other site: Secondary | ICD-10-CM | POA: Insufficient documentation

## 2017-04-26 DIAGNOSIS — C801 Malignant (primary) neoplasm, unspecified: Secondary | ICD-10-CM | POA: Insufficient documentation

## 2017-04-26 MED ORDER — IOPAMIDOL (ISOVUE-300) INJECTION 61%
INTRAVENOUS | Status: AC
  Administered 2017-04-26: 100 mL
  Filled 2017-04-26: qty 100

## 2017-04-26 NOTE — Telephone Encounter (Signed)
Pt called for results of breast MRI done 5/17. Dr Donne Hazel is the ordering MD. Dr Lindi Adie is in process of working up the pt.

## 2017-04-28 ENCOUNTER — Other Ambulatory Visit: Payer: Self-pay | Admitting: *Deleted

## 2017-04-28 DIAGNOSIS — Z170 Estrogen receptor positive status [ER+]: Principal | ICD-10-CM

## 2017-04-28 DIAGNOSIS — C50912 Malignant neoplasm of unspecified site of left female breast: Secondary | ICD-10-CM

## 2017-04-29 ENCOUNTER — Other Ambulatory Visit: Payer: Self-pay | Admitting: General Surgery

## 2017-04-30 ENCOUNTER — Encounter (HOSPITAL_COMMUNITY)
Admission: RE | Admit: 2017-04-30 | Discharge: 2017-04-30 | Disposition: A | Discharge status: Home / Self Care | Payer: BLUE CROSS/BLUE SHIELD | Source: Ambulatory Visit | Attending: Hematology and Oncology | Admitting: Hematology and Oncology

## 2017-04-30 ENCOUNTER — Telehealth: Payer: Self-pay | Admitting: Hematology and Oncology

## 2017-04-30 ENCOUNTER — Ambulatory Visit (HOSPITAL_BASED_OUTPATIENT_CLINIC_OR_DEPARTMENT_OTHER): Payer: BLUE CROSS/BLUE SHIELD | Admitting: Hematology and Oncology

## 2017-04-30 VITALS — BP 167/83 | HR 79 | Temp 98.2°F | Resp 18 | Ht 68.0 in | Wt 161.0 lb

## 2017-04-30 DIAGNOSIS — R937 Abnormal findings on diagnostic imaging of other parts of musculoskeletal system: Secondary | ICD-10-CM | POA: Diagnosis not present

## 2017-04-30 DIAGNOSIS — C50612 Malignant neoplasm of axillary tail of left female breast: Secondary | ICD-10-CM | POA: Diagnosis not present

## 2017-04-30 DIAGNOSIS — C50912 Malignant neoplasm of unspecified site of left female breast: Secondary | ICD-10-CM | POA: Diagnosis not present

## 2017-04-30 DIAGNOSIS — Z170 Estrogen receptor positive status [ER+]: Secondary | ICD-10-CM | POA: Diagnosis not present

## 2017-04-30 DIAGNOSIS — C50919 Malignant neoplasm of unspecified site of unspecified female breast: Secondary | ICD-10-CM | POA: Diagnosis not present

## 2017-04-30 MED ORDER — LORAZEPAM 0.5 MG PO TABS: 0.5000 mg | ORAL_TABLET | Freq: Every day | ORAL | 0 refills | Status: DC

## 2017-04-30 MED ORDER — LIDOCAINE-PRILOCAINE 2.5-2.5 % EX CREA: TOPICAL_CREAM | CUTANEOUS | 3 refills | Status: DC

## 2017-04-30 MED ORDER — ONDANSETRON HCL 8 MG PO TABS: 8.0000 mg | ORAL_TABLET | Freq: Two times a day (BID) | ORAL | 1 refills | Status: DC | PRN

## 2017-04-30 MED ORDER — DEXAMETHASONE 4 MG PO TABS: 4.0000 mg | ORAL_TABLET | Freq: Two times a day (BID) | ORAL | 1 refills | Status: DC

## 2017-04-30 MED ORDER — TECHNETIUM TC 99M MEDRONATE IV KIT: 25.0000 | PACK | Freq: Once | INTRAVENOUS | Status: DC | PRN

## 2017-04-30 MED ORDER — PROCHLORPERAZINE MALEATE 10 MG PO TABS: 10.0000 mg | ORAL_TABLET | Freq: Four times a day (QID) | ORAL | 1 refills | Status: DC | PRN

## 2017-04-30 NOTE — Telephone Encounter (Signed)
Scheduled appt per sch message from 5/25 [facility] . Gave patient AVS and calender.

## 2017-04-30 NOTE — Progress Notes (Signed)
Patient Care Team: Deland Pretty, MD as PCP - General (Internal Medicine)  DIAGNOSIS:  Encounter Diagnosis  Name Primary?  . 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, estrogen receptor positive ([place])     SUMMARY OF ONCOLOGIC HISTORY:   Breast cancer, left breast ([place])   04/14/2017 Initial Diagnosis    Left axillary lymph node biopsy: Metastatic carcinoma positive for CK 7, ER positive,GATA-3 equivocal, negative for CK 20,GCDFP, CDX-2, TTF-1; HER-2 positive ratio 2.08; mammogram and ultrasound revealed 3 discrete abnormal left axillary lymph nodes largest measures 1.8 cm      04/23/2017 Breast MRI    Left axillary lymphadenopathy numerous enlarged level I axillary lymph nodes largest 2.1 cm, no other abdominal masses in the left breast itself, no MRI evidence of malignancy in the right breast      04/26/2017 Imaging    CT CAP: Prominent left axillary lymph nodes no distant metastases, sclerotic focus left pedicle of T9 vertebra favored benign, T5 vertebral hemangioma       CHIEF COMPLIANT: Follow up to discuss the treatment plan  INTERVAL HISTORY: Melissa [name] is a 56 yr old with left axilllary lymphadenopathy from breast cancer is here to discuss the results of the scans as well as the treatment plan.she had bone scan today morning as well. Shes here with her daughter who is a Marine scientist at [employer].  REVIEW OF SYSTEMS:   Constitutional: Denies fevers, chills or abnormal weight loss Eyes: Denies blurriness of vision Ears, nose, mouth, throat, and face: Denies mucositis or sore throat Respiratory: Denies cough, dyspnea or wheezes Cardiovascular: Denies palpitation, chest discomfort Gastrointestinal:  Denies nausea, heartburn or change in bowel habits Skin: Denies abnormal skin rashes Lymphatics: Denies new lymphadenopathy or easy bruising Neurological:Denies numbness, tingling or new weaknesses Behavioral/Psych: Mood is stable, no new changes    Extremities: No lower extremity edema  All other systems were reviewed with the patient and are negative.  I have reviewed the past medical history, past surgical history, social history and family history with the patient and they are unchanged from previous note.  ALLERGIES:  has No Known Allergies.  MEDICATIONS:  Current Outpatient Prescriptions  Medication Sig Dispense Refill  . ALPRAZolam (XANAX) 0.5 MG tablet Take 0.5 mg by mouth 3 (three) times daily.  0  . anastrozole (ARIMIDEX) 1 MG tablet Take 1 tablet (1 mg total) by mouth daily. 90 tablet 3  . calcium carbonate (OS-CAL) 1250 (500 Ca) MG chewable tablet Chew 1 tablet by mouth daily. Calcium 600 mg    . cholecalciferol (VITAMIN D) 1000 units tablet Take 1,000 Units by mouth daily. 2000 iu    . predniSONE (STERAPRED UNI-PAK 21 TAB) 10 MG (21) TBPK tablet   0  . rosuvastatin (CRESTOR) 10 MG tablet   0  . triamterene-hydrochlorothiazide (MAXZIDE-25) 37.5-25 MG tablet take 1/2 to 1 tablet by mouth daily IN THE MORNING  0  . valACYclovir (VALTREX) 1000 MG tablet take 2 tablets by mouth every 12 hours if needed for 10 days  0  . venlafaxine XR (EFFEXOR-XR) 37.5 MG 24 hr capsule TK 1 C PO QD WF  0   No current facility-administered medications for this visit.    Facility-Administered Medications Ordered in Other Visits  Medication Dose Route Frequency Provider Last Rate Last Dose  . technetium medronate (TC-MDP) injection 25 millicurie  25 millicurie Intravenous Once PRN Lorriane Shire, MD        PHYSICAL EXAMINATION: ECOG PERFORMANCE STATUS:  0  Vitals:   04/30/17 1156  BP: (!) 167/83  Pulse: 79  Resp: 18  Temp: 98.2 F (36.8 C)   Filed Weights   04/30/17 1156  Weight: 161 lb (73 kg)    GENERAL:alert, no distress and comfortable SKIN: skin color, texture, turgor are normal, no rashes or significant lesions EYES: normal, Conjunctiva are pink and non-injected, sclera clear OROPHARYNX:no exudate, no erythema and lips,  buccal mucosa, and tongue normal  NECK: supple, thyroid normal size, non-tender, without nodularity LYMPH:  no palpable lymphadenopathy in the cervical, axillary or inguinal LUNGS: clear to auscultation and percussion with normal breathing effort HEART: regular rate & rhythm and no murmurs and no lower extremity edema ABDOMEN:abdomen soft, non-tender and normal bowel sounds MUSCULOSKELETAL:no cyanosis of digits and no clubbing  NEURO: alert & oriented x 3 with fluent speech, no focal motor/sensory deficits EXTREMITIES: No lower extremity edema  LABORATORY DATA:  I have reviewed the data as listed   Chemistry   No results found for: NA, K, CL, CO2, BUN, CREATININE, GLU No results found for: CALCIUM, ALKPHOS, AST, ALT, BILITOT     No results found for: WBC, HGB, HCT, MCV, PLT, NEUTROABS  ASSESSMENT & PLAN:  Breast cancer, left breast ([place]) 04/14/2017 Left axillary lymph node biopsy: Metastatic carcinoma positive for CK 7, ER positive,GATA-3 equivocal, negative for CK 20,GCDFP, CDX-2, TTF-1; HER-2 positive ratio 2.08 mammogram and ultrasound revealed 3 discrete abnormal left axillary lymph nodes largest measures 1.8 cm Clinical stage: TX N1 MX  Treatment plan: 1. Neoadjuvant TCH Perjeta 6 cycles followed by Herceptin maintenance for 1 year 2. followed by axillary lymph node dissection. Breast surgery will not be performed because there is no primary tumor identifiable on breast MRI 3. Followed by radiation 4. Followed by antiestrogen therapy  Chemotherapy Counseling: I discussed the risks and benefits of chemotherapy including the risks of nausea/ vomiting, risk of infection from low WBC count, fatigue due to chemo or anemia, bruising or bleeding due to low platelets, mouth sores, loss/ change in taste and decreased appetite. Liver and kidney function will be monitored through out chemotherapy as abnormalities in liver and kidney function may be a side effect of treatment. Cardiac  dysfunction due to Herceptin and Perjeta were discussed in detail. Risk of permanent bone marrow dysfunction and leukemia due to chemo were also discussed.  Return to clinic on 05/13/2017 to start chemotherapy. Follow-up one week later for toxicity check.   I spent 25 minutes talking to the patient of which more than half was spent in counseling and coordination of care.  No orders of the defined types were placed in this encounter.  The patient has a good understanding of the overall plan. she agrees with it. she will call with any problems that may develop before the next visit here.   Rulon Eisenmenger, MD 04/30/17

## 2017-04-30 NOTE — Assessment & Plan Note (Signed)
04/14/2017 Left axillary lymph node biopsy: Metastatic carcinoma positive for CK 7, ER positive,GATA-3 equivocal, negative for CK 20,GCDFP, CDX-2, TTF-1; HER-2 positive ratio 2.08 mammogram and ultrasound revealed 3 discrete abnormal left axillary lymph nodes largest measures 1.8 cm Clinical stage: TX N1 MX  Treatment plan: 1. Neoadjuvant TCH Perjeta 6 cycles followed by Herceptin maintenance for 1 year 2. followed by axillary lymph node dissection. Breast surgery will not be performed because there is no primary tumor identifiable on breast MRI 3. Followed by radiation 4. Followed by antiestrogen therapy  Return to clinic on 05/13/2017 to start chemotherapy. Follow-up one week later for toxicity check.

## 2017-04-30 NOTE — Progress Notes (Signed)
START ON PATHWAY REGIMEN - Breast   Docetaxel  + Carboplatin + Trastuzumab + Pertuzumab (TCHP) q21 Days:   A cycle is every 21 days:     Pertuzumab      Pertuzumab      Trastuzumab      Trastuzumab      Carboplatin      Docetaxel   **Always confirm dose/schedule in your pharmacy ordering system**    Trastuzumab (Maintenance - NO Loading Dose):   A cycle is every 21 days:     Trastuzumab   **Always confirm dose/schedule in your pharmacy ordering system**    Patient Characteristics: Preoperative or Nonsurgical Candidate (Clinical Staging), Neoadjuvant Therapy followed by Surgery, Invasive Disease, Chemotherapy, HER2 Positive, ER Positive Therapeutic Status: Preoperative or Nonsurgical Candidate (Clinical Staging) AJCC M Category: cM0 AJCC Grade: G3 Breast Surgical Plan: Neoadjuvant Therapy followed by Surgery ER Status: Positive (+) AJCC 8 Stage Grouping: Unknown HER2 Status: Positive (+) AJCC T Category: cTX AJCC N Category: cN1 PR Status: Negative (-)  Intent of Therapy: Curative Intent, Discussed with Patient

## 2017-05-04 ENCOUNTER — Encounter: Payer: Self-pay | Admitting: *Deleted

## 2017-05-06 ENCOUNTER — Ambulatory Visit: Payer: BLUE CROSS/BLUE SHIELD

## 2017-05-06 ENCOUNTER — Encounter: Payer: Self-pay | Admitting: *Deleted

## 2017-05-06 ENCOUNTER — Ambulatory Visit (HOSPITAL_COMMUNITY)
Admission: RE | Admit: 2017-05-06 | Discharge: 2017-05-06 | Disposition: A | Discharge status: Home / Self Care | Payer: BLUE CROSS/BLUE SHIELD | Source: Ambulatory Visit | Attending: Hematology and Oncology | Admitting: Hematology and Oncology

## 2017-05-06 DIAGNOSIS — Z170 Estrogen receptor positive status [ER+]: Secondary | ICD-10-CM | POA: Diagnosis not present

## 2017-05-06 DIAGNOSIS — Z01810 Encounter for preprocedural cardiovascular examination: Secondary | ICD-10-CM | POA: Diagnosis not present

## 2017-05-06 DIAGNOSIS — C50912 Malignant neoplasm of unspecified site of left female breast: Secondary | ICD-10-CM | POA: Diagnosis not present

## 2017-05-06 NOTE — Progress Notes (Signed)
  Echocardiogram 2D Echocardiogram has been performed.  Melissa [name] 05/06/2017, 11:09 AM

## 2017-05-07 ENCOUNTER — Encounter (HOSPITAL_BASED_OUTPATIENT_CLINIC_OR_DEPARTMENT_OTHER): Payer: Self-pay | Admitting: *Deleted

## 2017-05-10 ENCOUNTER — Encounter (HOSPITAL_BASED_OUTPATIENT_CLINIC_OR_DEPARTMENT_OTHER)
Admission: RE | Admit: 2017-05-10 | Discharge: 2017-05-10 | Disposition: A | Discharge status: Home / Self Care | Payer: BLUE CROSS/BLUE SHIELD | Source: Ambulatory Visit | Attending: General Surgery | Admitting: General Surgery

## 2017-05-10 DIAGNOSIS — Z79899 Other long term (current) drug therapy: Secondary | ICD-10-CM | POA: Diagnosis not present

## 2017-05-10 DIAGNOSIS — E7800 Pure hypercholesterolemia, unspecified: Secondary | ICD-10-CM | POA: Diagnosis not present

## 2017-05-10 DIAGNOSIS — Z90710 Acquired absence of both cervix and uterus: Secondary | ICD-10-CM | POA: Diagnosis not present

## 2017-05-10 DIAGNOSIS — Z8541 Personal history of malignant neoplasm of cervix uteri: Secondary | ICD-10-CM | POA: Diagnosis not present

## 2017-05-10 DIAGNOSIS — I10 Essential (primary) hypertension: Secondary | ICD-10-CM | POA: Diagnosis not present

## 2017-05-10 DIAGNOSIS — C50912 Malignant neoplasm of unspecified site of left female breast: Secondary | ICD-10-CM | POA: Diagnosis not present

## 2017-05-10 LAB — BASIC METABOLIC PANEL
ANION GAP: 11 (ref 5–15)
BUN: 12 mg/dL (ref 6–20)
CALCIUM: 10.6 mg/dL — AB (ref 8.9–10.3)
CO2: 30 mmol/L (ref 22–32)
Chloride: 97 mmol/L — ABNORMAL LOW (ref 101–111)
Creatinine, Ser: 0.73 mg/dL (ref 0.44–1.00)
Glucose, Bld: 103 mg/dL — ABNORMAL HIGH (ref 65–99)
Potassium: 4.4 mmol/L (ref 3.5–5.1)
SODIUM: 138 mmol/L (ref 135–145)

## 2017-05-10 NOTE — Progress Notes (Signed)
Pt in for PAT appt, Boost Breeze given to pt and instructions reviewed.

## 2017-05-11 ENCOUNTER — Other Ambulatory Visit: Payer: Self-pay | Admitting: Adult Health

## 2017-05-12 ENCOUNTER — Encounter (HOSPITAL_BASED_OUTPATIENT_CLINIC_OR_DEPARTMENT_OTHER)
Admission: RE | Disposition: A | Discharge status: Home / Self Care | Payer: Self-pay | Source: Ambulatory Visit | Attending: General Surgery

## 2017-05-12 ENCOUNTER — Ambulatory Visit (HOSPITAL_BASED_OUTPATIENT_CLINIC_OR_DEPARTMENT_OTHER)
Admission: RE | Admit: 2017-05-12 | Discharge: 2017-05-12 | Disposition: A | Discharge status: Home / Self Care | Payer: BLUE CROSS/BLUE SHIELD | Source: Ambulatory Visit | Attending: General Surgery | Admitting: General Surgery

## 2017-05-12 ENCOUNTER — Ambulatory Visit (HOSPITAL_COMMUNITY): Payer: BLUE CROSS/BLUE SHIELD

## 2017-05-12 ENCOUNTER — Ambulatory Visit (HOSPITAL_BASED_OUTPATIENT_CLINIC_OR_DEPARTMENT_OTHER): Payer: BLUE CROSS/BLUE SHIELD | Admitting: Certified Registered"

## 2017-05-12 ENCOUNTER — Encounter (HOSPITAL_BASED_OUTPATIENT_CLINIC_OR_DEPARTMENT_OTHER): Payer: Self-pay | Admitting: Certified Registered"

## 2017-05-12 DIAGNOSIS — Z95828 Presence of other vascular implants and grafts: Secondary | ICD-10-CM

## 2017-05-12 DIAGNOSIS — Z79899 Other long term (current) drug therapy: Secondary | ICD-10-CM | POA: Insufficient documentation

## 2017-05-12 DIAGNOSIS — Z90710 Acquired absence of both cervix and uterus: Secondary | ICD-10-CM | POA: Insufficient documentation

## 2017-05-12 DIAGNOSIS — Z8541 Personal history of malignant neoplasm of cervix uteri: Secondary | ICD-10-CM | POA: Insufficient documentation

## 2017-05-12 DIAGNOSIS — C50912 Malignant neoplasm of unspecified site of left female breast: Secondary | ICD-10-CM | POA: Diagnosis not present

## 2017-05-12 DIAGNOSIS — E7800 Pure hypercholesterolemia, unspecified: Secondary | ICD-10-CM | POA: Insufficient documentation

## 2017-05-12 DIAGNOSIS — C50919 Malignant neoplasm of unspecified site of unspecified female breast: Secondary | ICD-10-CM

## 2017-05-12 DIAGNOSIS — C779 Secondary and unspecified malignant neoplasm of lymph node, unspecified: Secondary | ICD-10-CM | POA: Diagnosis not present

## 2017-05-12 DIAGNOSIS — I10 Essential (primary) hypertension: Secondary | ICD-10-CM | POA: Diagnosis not present

## 2017-05-12 DIAGNOSIS — Z452 Encounter for adjustment and management of vascular access device: Secondary | ICD-10-CM | POA: Diagnosis not present

## 2017-05-12 DIAGNOSIS — C50612 Malignant neoplasm of axillary tail of left female breast: Secondary | ICD-10-CM | POA: Diagnosis not present

## 2017-05-12 HISTORY — PX: PORTACATH PLACEMENT: SHX2246

## 2017-05-12 HISTORY — DX: Malignant neoplasm of unspecified site of unspecified female breast: C50.919

## 2017-05-12 SURGERY — INSERTION, TUNNELED CENTRAL VENOUS DEVICE, WITH PORT
Anesthesia: General | Site: Chest | Laterality: Right

## 2017-05-12 MED ORDER — CEFAZOLIN SODIUM-DEXTROSE 2-4 GM/100ML-% IV SOLN
INTRAVENOUS | Status: AC
  Filled 2017-05-12: qty 100

## 2017-05-12 MED ORDER — GABAPENTIN 300 MG PO CAPS
ORAL_CAPSULE | ORAL | Status: AC
  Filled 2017-05-12: qty 1

## 2017-05-12 MED ORDER — LIDOCAINE 2% (20 MG/ML) 5 ML SYRINGE
INTRAMUSCULAR | Status: AC
  Filled 2017-05-12: qty 20

## 2017-05-12 MED ORDER — ACETAMINOPHEN 500 MG PO TABS
ORAL_TABLET | ORAL | Status: AC
  Filled 2017-05-12: qty 2

## 2017-05-12 MED ORDER — FENTANYL CITRATE (PF) 100 MCG/2ML IJ SOLN
50.0000 ug | INTRAMUSCULAR | Status: DC | PRN
  Administered 2017-05-12: 100 ug via INTRAVENOUS

## 2017-05-12 MED ORDER — DEXAMETHASONE SODIUM PHOSPHATE 4 MG/ML IJ SOLN
INTRAMUSCULAR | Status: DC | PRN
  Administered 2017-05-12: 10 mg via INTRAVENOUS

## 2017-05-12 MED ORDER — BUPIVACAINE-EPINEPHRINE 0.25% -1:200000 IJ SOLN
INTRAMUSCULAR | Status: DC | PRN
  Administered 2017-05-12: 6 mL

## 2017-05-12 MED ORDER — HEPARIN (PORCINE) IN NACL 2-0.9 UNIT/ML-% IJ SOLN
INTRAMUSCULAR | Status: AC | PRN
  Administered 2017-05-12: 500 mL

## 2017-05-12 MED ORDER — ONDANSETRON HCL 4 MG/2ML IJ SOLN
INTRAMUSCULAR | Status: AC
  Filled 2017-05-12: qty 14

## 2017-05-12 MED ORDER — HYDROCODONE-ACETAMINOPHEN 5-325 MG PO TABS: 1.0000 | ORAL_TABLET | ORAL | 0 refills | Status: DC | PRN

## 2017-05-12 MED ORDER — GABAPENTIN 300 MG PO CAPS
300.0000 mg | ORAL_CAPSULE | ORAL | Status: AC
  Administered 2017-05-12: 300 mg via ORAL

## 2017-05-12 MED ORDER — CHLORHEXIDINE GLUCONATE CLOTH 2 % EX PADS: 6.0000 | MEDICATED_PAD | Freq: Once | CUTANEOUS | Status: DC

## 2017-05-12 MED ORDER — FENTANYL CITRATE (PF) 100 MCG/2ML IJ SOLN
INTRAMUSCULAR | Status: AC
  Filled 2017-05-12: qty 2

## 2017-05-12 MED ORDER — MIDAZOLAM HCL 2 MG/2ML IJ SOLN
1.0000 mg | INTRAMUSCULAR | Status: DC | PRN
  Administered 2017-05-12: 2 mg via INTRAVENOUS

## 2017-05-12 MED ORDER — CEFAZOLIN SODIUM-DEXTROSE 2-4 GM/100ML-% IV SOLN
2.0000 g | INTRAVENOUS | Status: AC
  Administered 2017-05-12: 2 g via INTRAVENOUS

## 2017-05-12 MED ORDER — HEPARIN (PORCINE) IN NACL 2-0.9 UNIT/ML-% IJ SOLN
INTRAMUSCULAR | Status: AC
  Filled 2017-05-12: qty 500

## 2017-05-12 MED ORDER — HEPARIN SOD (PORK) LOCK FLUSH 100 UNIT/ML IV SOLN
INTRAVENOUS | Status: AC
  Filled 2017-05-12: qty 5

## 2017-05-12 MED ORDER — ACETAMINOPHEN 500 MG PO TABS
1000.0000 mg | ORAL_TABLET | ORAL | Status: AC
  Administered 2017-05-12: 1000 mg via ORAL

## 2017-05-12 MED ORDER — DEXAMETHASONE SODIUM PHOSPHATE 10 MG/ML IJ SOLN
INTRAMUSCULAR | Status: AC
  Filled 2017-05-12: qty 3

## 2017-05-12 MED ORDER — LIDOCAINE HCL (CARDIAC) 20 MG/ML IV SOLN
INTRAVENOUS | Status: DC | PRN
  Administered 2017-05-12: 60 mg via INTRAVENOUS

## 2017-05-12 MED ORDER — MIDAZOLAM HCL 2 MG/2ML IJ SOLN
INTRAMUSCULAR | Status: AC
  Filled 2017-05-12: qty 2

## 2017-05-12 MED ORDER — ONDANSETRON HCL 4 MG/2ML IJ SOLN
INTRAMUSCULAR | Status: DC | PRN
  Administered 2017-05-12: 4 mg via INTRAVENOUS

## 2017-05-12 MED ORDER — SCOPOLAMINE 1 MG/3DAYS TD PT72: 1.0000 | MEDICATED_PATCH | Freq: Once | TRANSDERMAL | Status: DC | PRN

## 2017-05-12 MED ORDER — PROPOFOL 10 MG/ML IV BOLUS
INTRAVENOUS | Status: DC | PRN
  Administered 2017-05-12: 150 mg via INTRAVENOUS

## 2017-05-12 MED ORDER — LACTATED RINGERS IV SOLN
INTRAVENOUS | Status: DC
  Administered 2017-05-12 (×2): via INTRAVENOUS

## 2017-05-12 MED ORDER — HEPARIN SOD (PORK) LOCK FLUSH 100 UNIT/ML IV SOLN
INTRAVENOUS | Status: DC | PRN
  Administered 2017-05-12: 500 [IU] via INTRAVENOUS

## 2017-05-12 SURGICAL SUPPLY — 48 items
BAG DECANTER FOR FLEXI CONT (MISCELLANEOUS) ×2 IMPLANT
BENZOIN TINCTURE PRP APPL 2/3 (GAUZE/BANDAGES/DRESSINGS) ×2 IMPLANT
BLADE SURG 11 STRL SS (BLADE) ×2 IMPLANT
BLADE SURG 15 STRL LF DISP TIS (BLADE) ×1 IMPLANT
BLADE SURG 15 STRL SS (BLADE) ×1
CANISTER SUCT 1200ML W/VALVE (MISCELLANEOUS) IMPLANT
CHLORAPREP W/TINT 26ML (MISCELLANEOUS) ×2 IMPLANT
COVER BACK TABLE 60X90IN (DRAPES) ×2 IMPLANT
COVER MAYO STAND STRL (DRAPES) ×2 IMPLANT
COVER PROBE 5X48 (MISCELLANEOUS)
DECANTER SPIKE VIAL GLASS SM (MISCELLANEOUS) IMPLANT
DERMABOND ADVANCED (GAUZE/BANDAGES/DRESSINGS) ×1
DERMABOND ADVANCED .7 DNX12 (GAUZE/BANDAGES/DRESSINGS) ×1 IMPLANT
DRAPE C-ARM 42X72 X-RAY (DRAPES) ×2 IMPLANT
DRAPE LAPAROSCOPIC ABDOMINAL (DRAPES) ×2 IMPLANT
DRAPE UTILITY XL STRL (DRAPES) ×2 IMPLANT
DRSG TEGADERM 4X4.75 (GAUZE/BANDAGES/DRESSINGS) IMPLANT
ELECT COATED BLADE 2.86 ST (ELECTRODE) ×2 IMPLANT
ELECT REM PT RETURN 9FT ADLT (ELECTROSURGICAL) ×2
ELECTRODE REM PT RTRN 9FT ADLT (ELECTROSURGICAL) ×1 IMPLANT
GAUZE SPONGE 4X4 12PLY STRL LF (GAUZE/BANDAGES/DRESSINGS) ×2 IMPLANT
GLOVE BIO SURGEON STRL SZ7 (GLOVE) ×2 IMPLANT
GLOVE BIOGEL PI IND STRL 7.5 (GLOVE) ×1 IMPLANT
GLOVE BIOGEL PI INDICATOR 7.5 (GLOVE) ×1
GOWN STRL REUS W/ TWL LRG LVL3 (GOWN DISPOSABLE) ×2 IMPLANT
GOWN STRL REUS W/TWL LRG LVL3 (GOWN DISPOSABLE) ×2
IV KIT MINILOC 20X1 SAFETY (NEEDLE) IMPLANT
KIT CVR 48X5XPRB PLUP LF (MISCELLANEOUS) IMPLANT
KIT PORT POWER 8FR ISP CVUE (Catheter) ×2 IMPLANT
NDL SAFETY ECLIPSE 18X1.5 (NEEDLE) IMPLANT
NEEDLE HYPO 18GX1.5 SHARP (NEEDLE)
NEEDLE HYPO 25X1 1.5 SAFETY (NEEDLE) ×2 IMPLANT
PACK BASIN DAY SURGERY FS (CUSTOM PROCEDURE TRAY) ×2 IMPLANT
PENCIL BUTTON HOLSTER BLD 10FT (ELECTRODE) ×2 IMPLANT
SLEEVE SCD COMPRESS KNEE MED (MISCELLANEOUS) ×2 IMPLANT
STRIP CLOSURE SKIN 1/2X4 (GAUZE/BANDAGES/DRESSINGS) ×2 IMPLANT
SUT MON AB 4-0 PC3 18 (SUTURE) ×2 IMPLANT
SUT PROLENE 2 0 SH DA (SUTURE) ×2 IMPLANT
SUT SILK 2 0 TIES 17X18 (SUTURE)
SUT SILK 2-0 18XBRD TIE BLK (SUTURE) IMPLANT
SUT VIC AB 3-0 SH 27 (SUTURE) ×1
SUT VIC AB 3-0 SH 27X BRD (SUTURE) ×1 IMPLANT
SYR 5ML LUER SLIP (SYRINGE) ×2 IMPLANT
SYR CONTROL 10ML LL (SYRINGE) ×2 IMPLANT
TOWEL OR 17X24 6PK STRL BLUE (TOWEL DISPOSABLE) ×2 IMPLANT
TOWEL OR NON WOVEN STRL DISP B (DISPOSABLE) ×2 IMPLANT
TUBE CONNECTING 20X1/4 (TUBING) IMPLANT
YANKAUER SUCT BULB TIP NO VENT (SUCTIONS) IMPLANT

## 2017-05-12 NOTE — Interval H&P Note (Signed)
History and Physical Interval Note:  05/12/2017 2:10 PM  Melissa [name]  has presented today for surgery, with the diagnosis of LEFT BREAST CANCER  The various methods of treatment have been discussed with the patient and family. After consideration of risks, benefits and other options for treatment, the patient has consented to  Procedure(s): INSERTION PORT-A-CATH WITH [place] (N/A) as a surgical intervention .  The patient's history has been reviewed, patient examined, no change in status, stable for surgery.  I have reviewed the patient's chart and labs.  Questions were answered to the patient's satisfaction.     Ginette Bradway

## 2017-05-12 NOTE — Anesthesia Preprocedure Evaluation (Signed)
Anesthesia Evaluation  Patient identified by MRN, date of birth, ID band Patient awake    Reviewed: Allergy & Precautions, NPO status , Patient's Chart, lab work & pertinent test results  Airway Mallampati: II  TM Distance: >3 FB Neck ROM: Full    Dental no notable dental hx.    Pulmonary neg pulmonary ROS,    Pulmonary exam normal breath sounds clear to auscultation       Cardiovascular hypertension, negative cardio ROS Normal cardiovascular exam Rhythm:Regular Rate:Normal     Neuro/Psych negative neurological ROS  negative psych ROS   GI/Hepatic negative GI ROS, Neg liver ROS,   Endo/Other  negative endocrine ROS  Renal/GU negative Renal ROS     Musculoskeletal negative musculoskeletal ROS (+)   Abdominal   Peds  Hematology negative hematology ROS (+)   Anesthesia Other Findings   Reproductive/Obstetrics negative OB ROS                             Anesthesia Physical Anesthesia Plan  ASA: II  Anesthesia Plan: General   Post-op Pain Management:    Induction: Intravenous  PONV Risk Score and Plan: 3 and Ondansetron, Dexamethasone, Propofol and Midazolam  Airway Management Planned: LMA  Additional Equipment:   Intra-op Plan:   Post-operative Plan: Extubation in OR  Informed Consent: I have reviewed the patients History and Physical, chart, labs and discussed the procedure including the risks, benefits and alternatives for the proposed anesthesia with the patient or authorized representative who has indicated his/her understanding and acceptance.   Dental advisory given  Plan Discussed with: CRNA  Anesthesia Plan Comments:         Anesthesia Quick Evaluation

## 2017-05-12 NOTE — Anesthesia Postprocedure Evaluation (Signed)
Anesthesia Post Note  Patient: Melissa [name]  Procedure(s) Performed: Procedure(s) (LRB): INSERTION PORT-A-CATH WITH ULTRASOUND (Right)     Patient location during evaluation: PACU Anesthesia Type: General Level of consciousness: sedated and patient cooperative Pain management: pain level controlled Vital Signs Assessment: post-procedure vital signs reviewed and stable Respiratory status: spontaneous breathing Cardiovascular status: stable Anesthetic complications: no    Last Vitals:  Vitals:   05/12/17 1545 05/12/17 1552  BP: 136/69 (!) 142/74  Pulse: 82 85  Resp: 16 16  Temp:  36.4 C    Last Pain:  Vitals:   05/12/17 1552  TempSrc: Oral  PainSc: [place]

## 2017-05-12 NOTE — Progress Notes (Signed)
Patient ID: Melissa [name], female   DOB: 05-11-1961, 56 y.o.   MRN: [id number] [place] reviewed, rx for postop norco given

## 2017-05-12 NOTE — Anesthesia Procedure Notes (Signed)
Procedure Name: LMA Insertion Date/Time: 05/12/2017 2:36 PM Performed by: Dior Stepter D Pre-anesthesia Checklist: Patient identified, Emergency Drugs available, Suction available and Patient being monitored Patient Re-evaluated:Patient Re-evaluated prior to inductionOxygen Delivery Method: Circle system utilized Preoxygenation: Pre-oxygenation with 100% oxygen Intubation Type: IV induction Ventilation: Mask ventilation without difficulty LMA: LMA inserted LMA Size: 3.0 Number of attempts: 1 Airway Equipment and Method: Bite block Placement Confirmation: positive ETCO2 Tube secured with: Tape Dental Injury: Teeth and Oropharynx as per pre-operative assessment

## 2017-05-12 NOTE — Transfer of Care (Signed)
Immediate Anesthesia Transfer of Care Note  Patient: Melissa [name]  Procedure(s) Performed: Procedure(s): INSERTION PORT-A-CATH WITH ULTRASOUND (Right)  Patient Location: PACU  Anesthesia Type:General  Level of Consciousness: awake and patient cooperative  Airway & Oxygen Therapy: Patient Spontanous Breathing and Patient connected to face mask oxygen  Post-op Assessment: Report given to RN and Post -op Vital signs reviewed and stable  Post vital signs: Reviewed and stable  Last Vitals:  Vitals:   05/12/17 1351  BP: (!) 161/78  Pulse: 81  Resp: 18  Temp: 36.7 C    Last Pain:  Vitals:   05/12/17 1351  TempSrc: Oral      Patients Stated Pain Goal: 0 (91/02/89 0228)  Complications: No apparent anesthesia complications

## 2017-05-12 NOTE — Op Note (Signed)
Preoperative diagnosis: occult breast cancer with left axillary lad need for venous access Postoperative diagnosis: same as above Procedure: right ij US guided powerport insertion Surgeon: Dr Serita Grammes EBL: minimal Anes: general  Specimens none Complications none Drains none Sponge count correct Dispo to pacu stable  Indications: This is a 82 yof with occult breast cancer and left axillary lad for primary chemotherapy.  Discussed port placement.  Procedure: After informed consent was obtained the patient was taken to the operating room. She was given antibiotics. Sequential compression devices were on her legs. She was then placed under general anesthesia with an LMA. Then she was prepped and draped in the standard sterile surgical fashion. Surgical timeout was then performed.  I used the ultrasound to identify the right internal jugular vein. I then accessed the vein using the ultrasound.This aspirated blood. I then placed the wire. This was confirmed by fluoroscopy and ultrasound to be in the correct position. I created a pocket on the right chest. I tunneled the line between the 2 sites. I then dilated the tract and placed the dilator assembly with the sheath. This was done under fluoroscopy. I then removed the sheath and dilator. The wire was also removed. The line was then pulled back to be in the venacava. I hooked this up to the port. I sutured this into place with 2-0 Prolene in 2 places. This aspirated blood and flushed easily.This was confirmed with a final fluoroscopy. I then closed this with 2-0 Vicryl and 4-0 Monocryl. Dermabond was placed on both the incisions.A dressing was placed. She tolerated this well and was transferred to the recovery room in stable condition

## 2017-05-12 NOTE — Discharge Instructions (Signed)

## 2017-05-12 NOTE — H&P (Signed)
Melissa [name] is an 56 y.o. female.   Chief Complaint: left axillary lad c/w breast cancer HPI: 89 yof with occult left breast cancer found to have positive nodes in left axilla that are consistent with breast cancer on core biopsy. No mri, exam or mm abnormality. She has been recommended primary chemotherapy.   Past Medical History:  Diagnosis Date  . Breast cancer ([place]) 04/2017   left breast  . Cervical cancer ([place])    1990, treated with hysterectomy only  . Hypercholesteremia   . Hypertension     Past Surgical History:  Procedure Laterality Date  . ABDOMINAL HYSTERECTOMY  1990  . EYE SURGERY     eye lift    History reviewed. No pertinent family history. Social History:  reports that she has never smoked. She has never used smokeless tobacco. She reports that she drinks alcohol. She reports that she does not use drugs.  Allergies: No Known Allergies  Medications Prior to Admission  Medication Sig Dispense Refill  . ALPRAZolam (XANAX) 0.5 MG tablet Take 0.5 mg by mouth 3 (three) times daily.  0  . anastrozole (ARIMIDEX) 1 MG tablet Take 1 tablet (1 mg total) by mouth daily. 90 tablet 3  . calcium carbonate (OS-CAL) 1250 (500 Ca) MG chewable tablet Chew 1 tablet by mouth daily. Calcium 600 mg    . cholecalciferol (VITAMIN D) 1000 units tablet Take 1,000 Units by mouth daily. 2000 iu    . rosuvastatin (CRESTOR) 10 MG tablet   0  . triamterene-hydrochlorothiazide (MAXZIDE-25) 37.5-25 MG tablet take 1/2 to 1 tablet by mouth daily IN THE MORNING  0  . dexamethasone (DECADRON) 4 MG tablet Take 1 tablet (4 mg total) by mouth 2 (two) times daily. Take 1 tab day before chemo and 1 tab day after chemo. 30 tablet 1  . lidocaine-prilocaine (EMLA) cream Apply to affected area once 30 g 3  . LORazepam (ATIVAN) 0.5 MG tablet Take 1 tablet (0.5 mg total) by mouth at bedtime. 30 tablet 0  . ondansetron (ZOFRAN) 8 MG tablet Take 1 tablet (8 mg total) by mouth 2 (two) times daily as needed for  refractory nausea / vomiting. Start on day 3 after chemo. 30 tablet 1  . predniSONE (STERAPRED UNI-PAK 21 TAB) 10 MG (21) TBPK tablet   0  . prochlorperazine (COMPAZINE) 10 MG tablet Take 1 tablet (10 mg total) by mouth every 6 (six) hours as needed (Nausea or vomiting). 30 tablet 1  . valACYclovir (VALTREX) 1000 MG tablet take 2 tablets by mouth every 12 hours if needed for 10 days  0  . venlafaxine XR (EFFEXOR-XR) 37.5 MG 24 hr capsule TK 1 C PO QD WF  0    No results found for this or any previous visit (from the past 48 hour(s)). No results found.  Review of Systems  All other systems reviewed and are negative.   Blood pressure (!) 161/78, pulse 81, temperature 98 F (36.7 C), temperature source Oral, resp. rate 18, height 5\' 8"  (1.727 m), weight 71.8 kg (158 lb 3.2 oz), SpO2 99 %. Physical Exam  Vitals reviewed. Constitutional: She appears well-developed and well-nourished.  HENT:  Mouth/Throat: Oropharynx is clear and moist.  Eyes: EOM are normal.  Neck: Neck supple.  Cardiovascular: Normal rate, regular rhythm and normal heart sounds.   Respiratory: Effort normal and breath sounds normal. She has no wheezes. She has no rales. Right breast exhibits no mass, no nipple discharge and no tenderness. Left breast  exhibits no mass, no nipple discharge and no tenderness.  GI: Soft. There is no tenderness.  Lymphadenopathy:    She has axillary adenopathy.       Right axillary: No pectoral and no lateral adenopathy present.       Left axillary: Lateral adenopathy present.       Right: No supraclavicular adenopathy present.       Left: No supraclavicular adenopathy present.  Skin: Skin is warm and dry.     Assessment/Plan Left axillary lad c/w breast primary plan for primary chemo Port placement today  Rolm Bookbinder, MD 05/12/2017, 2:08 PM

## 2017-05-13 ENCOUNTER — Encounter (HOSPITAL_BASED_OUTPATIENT_CLINIC_OR_DEPARTMENT_OTHER): Payer: Self-pay | Admitting: General Surgery

## 2017-05-13 ENCOUNTER — Encounter: Payer: Self-pay | Admitting: Pharmacist

## 2017-05-14 ENCOUNTER — Encounter: Payer: Self-pay | Admitting: *Deleted

## 2017-05-14 ENCOUNTER — Other Ambulatory Visit (HOSPITAL_BASED_OUTPATIENT_CLINIC_OR_DEPARTMENT_OTHER): Payer: BLUE CROSS/BLUE SHIELD

## 2017-05-14 ENCOUNTER — Ambulatory Visit (HOSPITAL_BASED_OUTPATIENT_CLINIC_OR_DEPARTMENT_OTHER): Payer: BLUE CROSS/BLUE SHIELD

## 2017-05-14 VITALS — BP 154/76 | HR 73 | Temp 98.1°F | Resp 18

## 2017-05-14 DIAGNOSIS — C50919 Malignant neoplasm of unspecified site of unspecified female breast: Secondary | ICD-10-CM

## 2017-05-14 DIAGNOSIS — Z5112 Encounter for antineoplastic immunotherapy: Secondary | ICD-10-CM

## 2017-05-14 DIAGNOSIS — Z5111 Encounter for antineoplastic chemotherapy: Secondary | ICD-10-CM

## 2017-05-14 DIAGNOSIS — C50612 Malignant neoplasm of axillary tail of left female breast: Secondary | ICD-10-CM

## 2017-05-14 DIAGNOSIS — C773 Secondary and unspecified malignant neoplasm of axilla and upper limb lymph nodes: Secondary | ICD-10-CM | POA: Diagnosis not present

## 2017-05-14 DIAGNOSIS — Z170 Estrogen receptor positive status [ER+]: Principal | ICD-10-CM

## 2017-05-14 LAB — COMPREHENSIVE METABOLIC PANEL
ALT: 30 U/L (ref 0–55)
ANION GAP: 13 meq/L — AB (ref 3–11)
AST: 18 U/L (ref 5–34)
Albumin: 3.9 g/dL (ref 3.5–5.0)
Alkaline Phosphatase: 80 U/L (ref 40–150)
BUN: 13.4 mg/dL (ref 7.0–26.0)
CO2: 28 meq/L (ref 22–29)
Calcium: 10.7 mg/dL — ABNORMAL HIGH (ref 8.4–10.4)
Chloride: 99 mEq/L (ref 98–109)
Creatinine: 0.7 mg/dL (ref 0.6–1.1)
Glucose: 169 mg/dl — ABNORMAL HIGH (ref 70–140)
POTASSIUM: 3.5 meq/L (ref 3.5–5.1)
Sodium: 141 mEq/L (ref 136–145)
Total Bilirubin: 0.3 mg/dL (ref 0.20–1.20)
Total Protein: 7.7 g/dL (ref 6.4–8.3)

## 2017-05-14 LAB — CBC WITH DIFFERENTIAL/PLATELET
BASO%: 0.1 % (ref 0.0–2.0)
Basophils Absolute: 0 10*3/uL (ref 0.0–0.1)
EOS%: 0 % (ref 0.0–7.0)
Eosinophils Absolute: 0 10*3/uL (ref 0.0–0.5)
HCT: 41.8 % (ref 34.8–46.6)
HGB: 14.3 g/dL (ref 11.6–15.9)
LYMPH%: 12.9 % — ABNORMAL LOW (ref 14.0–49.7)
MCH: 30.7 pg (ref 25.1–34.0)
MCHC: 34.2 g/dL (ref 31.5–36.0)
MCV: 89.9 fL (ref 79.5–101.0)
MONO#: 0.2 10*3/uL (ref 0.1–0.9)
MONO%: 2.5 % (ref 0.0–14.0)
NEUT#: 7.7 10*3/uL — ABNORMAL HIGH (ref 1.5–6.5)
NEUT%: 84.5 % — ABNORMAL HIGH (ref 38.4–76.8)
PLATELETS: 328 10*3/uL (ref 145–400)
RBC: 4.66 10*6/uL (ref 3.70–5.45)
RDW: 13.6 % (ref 11.2–14.5)
WBC: 9.1 10*3/uL (ref 3.9–10.3)
lymph#: 1.2 10*3/uL (ref 0.9–3.3)

## 2017-05-14 MED ORDER — DIPHENHYDRAMINE HCL 25 MG PO CAPS
50.0000 mg | ORAL_CAPSULE | Freq: Once | ORAL | Status: AC
  Administered 2017-05-14: 50 mg via ORAL

## 2017-05-14 MED ORDER — ACETAMINOPHEN 325 MG PO TABS
650.0000 mg | ORAL_TABLET | Freq: Once | ORAL | Status: AC
  Administered 2017-05-14: 650 mg via ORAL

## 2017-05-14 MED ORDER — PEGFILGRASTIM 6 MG/0.6ML ~~LOC~~ PSKT
6.0000 mg | PREFILLED_SYRINGE | Freq: Once | SUBCUTANEOUS | Status: AC
Start: 2017-05-14 — End: 2017-05-14
  Administered 2017-05-14: 6 mg via SUBCUTANEOUS
  Filled 2017-05-14: qty 0.6

## 2017-05-14 MED ORDER — CARBOPLATIN CHEMO INJECTION 600 MG/60ML
560.0000 mg | Freq: Once | INTRAVENOUS | Status: AC
  Administered 2017-05-14: 560 mg via INTRAVENOUS
  Filled 2017-05-14: qty 56

## 2017-05-14 MED ORDER — PALONOSETRON HCL INJECTION 0.25 MG/5ML
INTRAVENOUS | Status: AC
  Filled 2017-05-14: qty 5

## 2017-05-14 MED ORDER — DEXAMETHASONE SODIUM PHOSPHATE 10 MG/ML IJ SOLN
10.0000 mg | Freq: Once | INTRAMUSCULAR | Status: AC
  Administered 2017-05-14: 10 mg via INTRAVENOUS

## 2017-05-14 MED ORDER — SODIUM CHLORIDE 0.9% FLUSH
10.0000 mL | INTRAVENOUS | Status: DC | PRN
  Administered 2017-05-14: 10 mL
  Filled 2017-05-14: qty 10

## 2017-05-14 MED ORDER — SODIUM CHLORIDE 0.9 % IV SOLN
Freq: Once | INTRAVENOUS | Status: AC
  Administered 2017-05-14: 10:00:00 via INTRAVENOUS

## 2017-05-14 MED ORDER — DEXAMETHASONE SODIUM PHOSPHATE 10 MG/ML IJ SOLN
INTRAMUSCULAR | Status: AC
Start: 2017-05-14 — End: 2017-05-14
  Filled 2017-05-14: qty 1

## 2017-05-14 MED ORDER — ACETAMINOPHEN 325 MG PO TABS
ORAL_TABLET | ORAL | Status: AC
  Filled 2017-05-14: qty 2

## 2017-05-14 MED ORDER — PERTUZUMAB CHEMO INJECTION 420 MG/14ML
840.0000 mg | Freq: Once | INTRAVENOUS | Status: AC
  Administered 2017-05-14: 840 mg via INTRAVENOUS
  Filled 2017-05-14: qty 28

## 2017-05-14 MED ORDER — TRASTUZUMAB CHEMO 150 MG IV SOLR
8.0000 mg/kg | Freq: Once | INTRAVENOUS | Status: AC
  Administered 2017-05-14: 588 mg via INTRAVENOUS
  Filled 2017-05-14: qty 28

## 2017-05-14 MED ORDER — DOCETAXEL CHEMO INJECTION 160 MG/16ML
75.0000 mg/m2 | Freq: Once | INTRAVENOUS | Status: AC
  Administered 2017-05-14: 140 mg via INTRAVENOUS
  Filled 2017-05-14: qty 14

## 2017-05-14 MED ORDER — PALONOSETRON HCL INJECTION 0.25 MG/5ML
0.2500 mg | Freq: Once | INTRAVENOUS | Status: AC
  Administered 2017-05-14: 0.25 mg via INTRAVENOUS

## 2017-05-14 MED ORDER — HEPARIN SOD (PORK) LOCK FLUSH 100 UNIT/ML IV SOLN
500.0000 [IU] | Freq: Once | INTRAVENOUS | Status: AC | PRN
  Administered 2017-05-14: 500 [IU]
  Filled 2017-05-14: qty 5

## 2017-05-14 MED ORDER — DIPHENHYDRAMINE HCL 25 MG PO CAPS
ORAL_CAPSULE | ORAL | Status: AC
  Filled 2017-05-14: qty 2

## 2017-05-14 NOTE — Progress Notes (Signed)
[facility] Psychosocial Distress Screening Clinical Social Work  Clinical Social Work was referred by distress screening protocol.  The patient scored a 7 on the Psychosocial Distress Thermometer which indicates severe distress. Clinical Social Worker reviewed chart and met with pt at chemo to assess for distress and other psychosocial needs. CSW introduced self, explained role of CSW/Pt and Family Support Team, support groups and other resources to assist. Pt reports she is doing well currently and much better now that she is aware of her plan. Pt shared she was interested in updating her ADRs and CSW provided education on how CSW team could assist. Pt plans to reach out to make appt with CSW to complete at future date.    [facility] DISTRESS SCREENING 04/23/2017  Screening Type Initial Screening  Distress experienced in past week (1-10) 7  Emotional problem type Nervousness/Anxiety;Adjusting to illness  Physical Problem type Sleep/insomnia    Clinical Social Worker follow up needed: Yes.    If yes, follow up plan: See above  Grier Hock, LCSW, OSW-C Clinical Social Worker [facility]  [facility] Phone: [phone] Fax: [phone]    

## 2017-05-14 NOTE — Patient Instructions (Signed)
Trastuzumab injection for infusion What is this medicine? TRASTUZUMAB (tras TOO zoo mab) is a monoclonal antibody. It is used to treat breast cancer and stomach cancer. This medicine may be used for other purposes; ask your health care provider or pharmacist if you have questions. COMMON BRAND NAME(S): Herceptin What should I tell my health care provider before I take this medicine? They need to know if you have any of these conditions: -heart disease -heart failure -lung or breathing disease, like asthma -an unusual or allergic reaction to trastuzumab, benzyl alcohol, or other medications, foods, dyes, or preservatives -pregnant or trying to get pregnant -breast-feeding How should I use this medicine? This drug is given as an infusion into a vein. It is administered in a hospital or clinic by a specially trained health care professional. Talk to your pediatrician regarding the use of this medicine in children. This medicine is not approved for use in children. Overdosage: If you think you have taken too much of this medicine contact a poison control center or emergency room at once. NOTE: This medicine is only for you. Do not share this medicine with others. What if I miss a dose? It is important not to miss a dose. Call your doctor or health care professional if you are unable to keep an appointment. What may interact with this medicine? This medicine may interact with the following medications: -certain types of chemotherapy, such as daunorubicin, doxorubicin, epirubicin, and idarubicin This list may not describe all possible interactions. Give your health care provider a list of all the medicines, herbs, non-prescription drugs, or dietary supplements you use. Also tell them if you smoke, drink alcohol, or use illegal drugs. Some items may interact with your medicine. What should I watch for while using this medicine? Visit your doctor for checks on your progress. Report any side effects.  Continue your course of treatment even though you feel ill unless your doctor tells you to stop. Call your doctor or health care professional for advice if you get a fever, chills or sore throat, or other symptoms of a cold or flu. Do not treat yourself. Try to avoid being around people who are sick. You may experience fever, chills and shaking during your first infusion. These effects are usually mild and can be treated with other medicines. Report any side effects during the infusion to your health care professional. Fever and chills usually do not happen with later infusions. Do not become pregnant while taking this medicine or for 7 months after stopping it. Women should inform their doctor if they wish to become pregnant or think they might be pregnant. Women of child-bearing potential will need to have a negative pregnancy test before starting this medicine. There is a potential for serious side effects to an unborn child. Talk to your health care professional or pharmacist for more information. Do not breast-feed an infant while taking this medicine or for 7 months after stopping it. Women must use effective birth control with this medicine. What side effects may I notice from receiving this medicine? Side effects that you should report to your doctor or health care professional as soon as possible: -allergic reactions like skin rash, itching or hives, swelling of the face, lips, or tongue -chest pain or palpitations -cough -dizziness -feeling faint or lightheaded, falls -fever -general ill feeling or flu-like symptoms -signs of worsening heart failure like breathing problems; swelling in your legs and feet -unusually weak or tired Side effects that usually do not require medical   or lightheaded, falls  -fever  -general ill feeling or flu-like symptoms  -signs of worsening heart failure like breathing problems; swelling in your legs and feet  -unusually weak or tired  Side effects that usually do not require medical attention (report to your doctor or health care professional if they continue or are bothersome):  -bone pain  -changes in taste  -diarrhea  -joint pain  -nausea/vomiting  -weight loss  This list may not describe all  possible side effects. Call your doctor for medical advice about side effects. You may report side effects to FDA at 1-800-FDA-1088.  Where should I keep my medicine?  This drug is given in a hospital or clinic and will not be stored at home.  NOTE: This sheet is a summary. It may not cover all possible information. If you have questions about this medicine, talk to your doctor, pharmacist, or health care provider.   2018 Elsevier/Gold Standard (2016-11-17 14:37:52)  Pertuzumab injection  What is this medicine?  PERTUZUMAB (per TOOZ ue mab) is a monoclonal antibody. It is used to treat breast cancer.  This medicine may be used for other purposes; ask your health care provider or pharmacist if you have questions.  COMMON BRAND NAME(S): PERJETA  What should I tell my health care provider before I take this medicine?  They need to know if you have any of these conditions:  -heart disease  -heart failure  -high blood pressure  -history of irregular heart beat  -recent or ongoing radiation therapy  -an unusual or allergic reaction to pertuzumab, other medicines, foods, dyes, or preservatives  -pregnant or trying to get pregnant  -breast-feeding  How should I use this medicine?  This medicine is for infusion into a vein. It is given by a health care professional in a hospital or clinic setting.  Talk to your pediatrician regarding the use of this medicine in children. Special care may be needed.  Overdosage: If you think you have taken too much of this medicine contact a poison control center or emergency room at once.  NOTE: This medicine is only for you. Do not share this medicine with others.  What if I miss a dose?  It is important not to miss your dose. Call your doctor or health care professional if you are unable to keep an appointment.  What may interact with this medicine?  Interactions are not expected.  Give your health care provider a list of all the medicines, herbs, non-prescription drugs, or dietary  supplements you use. Also tell them if you smoke, drink alcohol, or use illegal drugs. Some items may interact with your medicine.  This list may not describe all possible interactions. Give your health care provider a list of all the medicines, herbs, non-prescription drugs, or dietary supplements you use. Also tell them if you smoke, drink alcohol, or use illegal drugs. Some items may interact with your medicine.  What should I watch for while using this medicine?  Your condition will be monitored carefully while you are receiving this medicine. Report any side effects. Continue your course of treatment even though you feel ill unless your doctor tells you to stop.  Do not become pregnant while taking this medicine or for 7 months after stopping it. Women should inform their doctor if they wish to become pregnant or think they might be pregnant. Women of child-bearing potential will need to have a negative pregnancy test before starting this medicine. There is a potential for serious side   You may experience fever, chills, and headache during the infusion. Report any side effects during the infusion to your health care professional. What side effects may I notice from receiving this medicine? Side effects that you should report to your doctor or health care professional as soon as possible: -breathing problems -chest pain or palpitations -dizziness -feeling faint or lightheaded -fever or chills -skin rash, itching or hives -sore throat -swelling of the  face, lips, or tongue -swelling of the legs or ankles -unusually weak or tired Side effects that usually do not require medical attention (report to your doctor or health care professional if they continue or are bothersome): -diarrhea -hair loss -nausea, vomiting -tiredness This list may not describe all possible side effects. Call your doctor for medical advice about side effects. You may report side effects to FDA at 1-800-FDA-1088. Where should I keep my medicine? This drug is given in a hospital or clinic and will not be stored at home. NOTE: This sheet is a summary. It may not cover all possible information. If you have questions about this medicine, talk to your doctor, pharmacist, or health care provider.  2018 Elsevier/Gold Standard (2015-12-26 12:08:50)  Docetaxel injection What is this medicine? DOCETAXEL (doe se TAX el) is a chemotherapy drug. It targets fast dividing cells, like cancer cells, and causes these cells to die. This medicine is used to treat many types of cancers like breast cancer, certain stomach cancers, head and neck cancer, lung cancer, and prostate cancer. This medicine may be used for other purposes; ask your health care provider or pharmacist if you have questions. COMMON BRAND NAME(S): Docefrez, Taxotere What should I tell my health care provider before I take this medicine? They need to know if you have any of these conditions: -infection (especially a virus infection such as chickenpox, cold sores, or herpes) -liver disease -low blood counts, like low white cell, platelet, or red cell counts -an unusual or allergic reaction to docetaxel, polysorbate 80, other chemotherapy agents, other medicines, foods, dyes, or preservatives -pregnant or trying to get pregnant -breast-feeding How should I use this medicine? This drug is given as an infusion into a vein. It is administered in a hospital or clinic by a specially trained health care professional. Talk  to your pediatrician regarding the use of this medicine in children. Special care may be needed. Overdosage: If you think you have taken too much of this medicine contact a poison control center or emergency room at once. NOTE: This medicine is only for you. Do not share this medicine with others. What if I miss a dose? It is important not to miss your dose. Call your doctor or health care professional if you are unable to keep an appointment. What may interact with this medicine? -cyclosporine -erythromycin -ketoconazole -medicines to increase blood counts like filgrastim, pegfilgrastim, sargramostim -vaccines Talk to your doctor or health care professional before taking any of these medicines: -acetaminophen -aspirin -ibuprofen -ketoprofen -naproxen This list may not describe all possible interactions. Give your health care provider a list of all the medicines, herbs, non-prescription drugs, or dietary supplements you use. Also tell them if you smoke, drink alcohol, or use illegal drugs. Some items may interact with your medicine. What should I watch for while using this medicine? Your condition will be monitored carefully while you are receiving this medicine. You will need important blood work done while you are taking this medicine. This drug may make you feel generally unwell. This is not uncommon, as  chemotherapy can affect healthy cells as well as cancer cells. Report any side effects. Continue your course of treatment even though you feel ill unless your doctor tells you to stop. In some cases, you may be given additional medicines to help with side effects. Follow all directions for their use. Call your doctor or health care professional for advice if you get a fever, chills or sore throat, or other symptoms of a cold or flu. Do not treat yourself. This drug decreases your body's ability to fight infections. Try to avoid being around people who are sick. This medicine may increase  your risk to bruise or bleed. Call your doctor or health care professional if you notice any unusual bleeding. This medicine may contain alcohol in the product. You may get drowsy or dizzy. Do not drive, use machinery, or do anything that needs mental alertness until you know how this medicine affects you. Do not stand or sit up quickly, especially if you are an older patient. This reduces the risk of dizzy or fainting spells. Avoid alcoholic drinks. Do not become pregnant while taking this medicine. Women should inform their doctor if they wish to become pregnant or think they might be pregnant. There is a potential for serious side effects to an unborn child. Talk to your health care professional or pharmacist for more information. Do not breast-feed an infant while taking this medicine. What side effects may I notice from receiving this medicine? Side effects that you should report to your doctor or health care professional as soon as possible: -allergic reactions like skin rash, itching or hives, swelling of the face, lips, or tongue -low blood counts - This drug may decrease the number of white blood cells, red blood cells and platelets. You may be at increased risk for infections and bleeding. -signs of infection - fever or chills, cough, sore throat, pain or difficulty passing urine -signs of decreased platelets or bleeding - bruising, pinpoint red spots on the skin, black, tarry stools, nosebleeds -signs of decreased red blood cells - unusually weak or tired, fainting spells, lightheadedness -breathing problems -fast or irregular heartbeat -low blood pressure -mouth sores -nausea and vomiting -pain, swelling, redness or irritation at the injection site -pain, tingling, numbness in the hands or feet -swelling of the ankle, feet, hands -weight gain Side effects that usually do not require medical attention (report to your doctor or health care professional if they continue or are  bothersome): -bone pain -complete hair loss including hair on your head, underarms, pubic hair, eyebrows, and eyelashes -diarrhea -excessive tearing -changes in the color of fingernails -loosening of the fingernails -nausea -muscle pain -red flush to skin -sweating -weak or tired This list may not describe all possible side effects. Call your doctor for medical advice about side effects. You may report side effects to FDA at 1-800-FDA-1088. Where should I keep my medicine? This drug is given in a hospital or clinic and will not be stored at home. NOTE: This sheet is a summary. It may not cover all possible information. If you have questions about this medicine, talk to your doctor, pharmacist, or health care provider.  2018 Elsevier/Gold Standard (2015-12-26 12:32:56)  Carboplatin injection What is this medicine? CARBOPLATIN (KAR boe pla tin) is a chemotherapy drug. It targets fast dividing cells, like cancer cells, and causes these cells to die. This medicine is used to treat ovarian cancer and many other cancers. This medicine may be used for other purposes; ask your health care provider  or pharmacist if you have questions. COMMON BRAND NAME(S): Paraplatin What should I tell my health care provider before I take this medicine? They need to know if you have any of these conditions: -blood disorders -hearing problems -kidney disease -recent or ongoing radiation therapy -an unusual or allergic reaction to carboplatin, cisplatin, other chemotherapy, other medicines, foods, dyes, or preservatives -pregnant or trying to get pregnant -breast-feeding How should I use this medicine? This drug is usually given as an infusion into a vein. It is administered in a hospital or clinic by a specially trained health care professional. Talk to your pediatrician regarding the use of this medicine in children. Special care may be needed. Overdosage: If you think you have taken too much of this  medicine contact a poison control center or emergency room at once. NOTE: This medicine is only for you. Do not share this medicine with others. What if I miss a dose? It is important not to miss a dose. Call your doctor or health care professional if you are unable to keep an appointment. What may interact with this medicine? -medicines for seizures -medicines to increase blood counts like filgrastim, pegfilgrastim, sargramostim -some antibiotics like amikacin, gentamicin, neomycin, streptomycin, tobramycin -vaccines Talk to your doctor or health care professional before taking any of these medicines: -acetaminophen -aspirin -ibuprofen -ketoprofen -naproxen This list may not describe all possible interactions. Give your health care provider a list of all the medicines, herbs, non-prescription drugs, or dietary supplements you use. Also tell them if you smoke, drink alcohol, or use illegal drugs. Some items may interact with your medicine. What should I watch for while using this medicine? Your condition will be monitored carefully while you are receiving this medicine. You will need important blood work done while you are taking this medicine. This drug may make you feel generally unwell. This is not uncommon, as chemotherapy can affect healthy cells as well as cancer cells. Report any side effects. Continue your course of treatment even though you feel ill unless your doctor tells you to stop. In some cases, you may be given additional medicines to help with side effects. Follow all directions for their use. Call your doctor or health care professional for advice if you get a fever, chills or sore throat, or other symptoms of a cold or flu. Do not treat yourself. This drug decreases your body's ability to fight infections. Try to avoid being around people who are sick. This medicine may increase your risk to bruise or bleed. Call your doctor or health care professional if you notice any  unusual bleeding. Be careful brushing and flossing your teeth or using a toothpick because you may get an infection or bleed more easily. If you have any dental work done, tell your dentist you are receiving this medicine. Avoid taking products that contain aspirin, acetaminophen, ibuprofen, naproxen, or ketoprofen unless instructed by your doctor. These medicines may hide a fever. Do not become pregnant while taking this medicine. Women should inform their doctor if they wish to become pregnant or think they might be pregnant. There is a potential for serious side effects to an unborn child. Talk to your health care professional or pharmacist for more information. Do not breast-feed an infant while taking this medicine. What side effects may I notice from receiving this medicine? Side effects that you should report to your doctor or health care professional as soon as possible: -allergic reactions like skin rash, itching or hives, swelling of the face, lips,  or tongue -signs of infection - fever or chills, cough, sore throat, pain or difficulty passing urine -signs of decreased platelets or bleeding - bruising, pinpoint red spots on the skin, black, tarry stools, nosebleeds -signs of decreased red blood cells - unusually weak or tired, fainting spells, lightheadedness -breathing problems -changes in hearing -changes in vision -chest pain -high blood pressure -low blood counts - This drug may decrease the number of white blood cells, red blood cells and platelets. You may be at increased risk for infections and bleeding. -nausea and vomiting -pain, swelling, redness or irritation at the injection site -pain, tingling, numbness in the hands or feet -problems with balance, talking, walking -trouble passing urine or change in the amount of urine Side effects that usually do not require medical attention (report to your doctor or health care professional if they continue or are bothersome): -hair  loss -loss of appetite -metallic taste in the mouth or changes in taste This list may not describe all possible side effects. Call your doctor for medical advice about side effects. You may report side effects to FDA at 1-800-FDA-1088. Where should I keep my medicine? This drug is given in a hospital or clinic and will not be stored at home. NOTE: This sheet is a summary. It may not cover all possible information. If you have questions about this medicine, talk to your doctor, pharmacist, or health care provider.  2018 Elsevier/Gold Standard (2008-02-28 14:38:05)  Pegfilgrastim injection What is this medicine? PEGFILGRASTIM (PEG fil gra stim) is a long-acting granulocyte colony-stimulating factor that stimulates the growth of neutrophils, a type of white blood cell important in the body's fight against infection. It is used to reduce the incidence of fever and infection in patients with certain types of cancer who are receiving chemotherapy that affects the bone marrow, and to increase survival after being exposed to high doses of radiation. This medicine may be used for other purposes; ask your health care provider or pharmacist if you have questions. COMMON BRAND NAME(S): Neulasta What should I tell my health care provider before I take this medicine? They need to know if you have any of these conditions: -kidney disease -latex allergy -ongoing radiation therapy -sickle cell disease -skin reactions to acrylic adhesives (On-Body Injector only) -an unusual or allergic reaction to pegfilgrastim, filgrastim, other medicines, foods, dyes, or preservatives -pregnant or trying to get pregnant -breast-feeding How should I use this medicine? This medicine is for injection under the skin. If you get this medicine at home, you will be taught how to prepare and give the pre-filled syringe or how to use the On-body Injector. Refer to the patient Instructions for Use for detailed instructions. Use  exactly as directed. Tell your healthcare provider immediately if you suspect that the On-body Injector may not have performed as intended or if you suspect the use of the On-body Injector resulted in a missed or partial dose. It is important that you put your used needles and syringes in a special sharps container. Do not put them in a trash can. If you do not have a sharps container, call your pharmacist or healthcare provider to get one. Talk to your pediatrician regarding the use of this medicine in children. While this drug may be prescribed for selected conditions, precautions do apply. Overdosage: If you think you have taken too much of this medicine contact a poison control center or emergency room at once. NOTE: This medicine is only for you. Do not share this medicine with  others. What if I miss a dose? It is important not to miss your dose. Call your doctor or health care professional if you miss your dose. If you miss a dose due to an On-body Injector failure or leakage, a new dose should be administered as soon as possible using a single prefilled syringe for manual use. What may interact with this medicine? Interactions have not been studied. Give your health care provider a list of all the medicines, herbs, non-prescription drugs, or dietary supplements you use. Also tell them if you smoke, drink alcohol, or use illegal drugs. Some items may interact with your medicine. This list may not describe all possible interactions. Give your health care provider a list of all the medicines, herbs, non-prescription drugs, or dietary supplements you use. Also tell them if you smoke, drink alcohol, or use illegal drugs. Some items may interact with your medicine. What should I watch for while using this medicine? You may need blood work done while you are taking this medicine. If you are going to need a MRI, CT scan, or other procedure, tell your doctor that you are using this medicine (On-Body  Injector only). What side effects may I notice from receiving this medicine? Side effects that you should report to your doctor or health care professional as soon as possible: -allergic reactions like skin rash, itching or hives, swelling of the face, lips, or tongue -dizziness -fever -pain, redness, or irritation at site where injected -pinpoint red spots on the skin -red or dark-brown urine -shortness of breath or breathing problems -stomach or side pain, or pain at the shoulder -swelling -tiredness -trouble passing urine or change in the amount of urine Side effects that usually do not require medical attention (report to your doctor or health care professional if they continue or are bothersome): -bone pain -muscle pain This list may not describe all possible side effects. Call your doctor for medical advice about side effects. You may report side effects to FDA at 1-800-FDA-1088. Where should I keep my medicine? Keep out of the reach of children. Store pre-filled syringes in a refrigerator between 2 and 8 degrees C (36 and 46 degrees F). Do not freeze. Keep in carton to protect from light. Throw away this medicine if it is left out of the refrigerator for more than 48 hours. Throw away any unused medicine after the expiration date. NOTE: This sheet is a summary. It may not cover all possible information. If you have questions about this medicine, talk to your doctor, pharmacist, or health care provider.  2018 Elsevier/Gold Standard (2016-11-19 12:58:03)  [facility] Discharge Instructions for Patients Receiving Chemotherapy  Today you received the following chemotherapy agents herceptin, perjeta, taxotere, and carboplatin  To help prevent nausea and vomiting after your treatment, we encourage you to take your nausea medication as prescribed   If you develop nausea and vomiting that is not controlled by your nausea medication, call the clinic.   BELOW ARE SYMPTOMS  THAT SHOULD BE REPORTED IMMEDIATELY:  *FEVER GREATER THAN 100.5 F  *CHILLS WITH OR WITHOUT FEVER  NAUSEA AND VOMITING THAT IS NOT CONTROLLED WITH YOUR NAUSEA MEDICATION  *UNUSUAL SHORTNESS OF BREATH  *UNUSUAL BRUISING OR BLEEDING  TENDERNESS IN MOUTH AND THROAT WITH OR WITHOUT PRESENCE OF ULCERS  *URINARY PROBLEMS  *BOWEL PROBLEMS  UNUSUAL RASH Items with * indicate a potential emergency and should be followed up as soon as possible.  Feel free to call the clinic you have any questions  or concerns. The clinic phone number is [phone].  Please show the [place] at check-in to the Emergency Department and triage nurse.

## 2017-05-17 ENCOUNTER — Telehealth: Payer: Self-pay | Admitting: *Deleted

## 2017-05-17 NOTE — Telephone Encounter (Signed)
Spoke with patient to follow up after her 1st chemo.  She states she had some issues over the weekend, but she is feeling better.  She would like to change her schedule to get her 2nd chemo on 6/29 and 3rd cycle on 7/24 for a trip to [place] they are taking.  Informed her I would change her schedule and if their was a problem, I would let her know.

## 2017-05-18 NOTE — Assessment & Plan Note (Signed)
04/14/2017 Left axillary lymph node biopsy: Metastatic carcinoma positive for CK 7, ER positive,GATA-3 equivocal, negative for CK 20,GCDFP, CDX-2, TTF-1; HER-2 positive ratio 2.08 mammogram and ultrasound revealed 3 discrete abnormal left axillary lymph nodes largest measures 1.8 cm Clinical stage: TX N1 MX  Treatment plan: 1. Neoadjuvant TCH Perjeta 6 cycles followed by Herceptin maintenance for 1 year 2. followed by axillary lymph node dissection. Breast surgery will not be performed because there is no primary tumor identifiable on breast MRI 3. Followed by radiation 4. Followed by antiestrogen therapy ------------------------------------------------------------------------------------------------------------------ Current Treatment: Cycle 1 day 1 TCHP Consent obtained Labs reviewed Follow-up one week later for toxicity check. 

## 2017-05-19 ENCOUNTER — Other Ambulatory Visit: Payer: Self-pay

## 2017-05-19 ENCOUNTER — Telehealth (HOSPITAL_COMMUNITY): Payer: Self-pay | Admitting: Vascular Surgery

## 2017-05-19 ENCOUNTER — Ambulatory Visit (HOSPITAL_BASED_OUTPATIENT_CLINIC_OR_DEPARTMENT_OTHER): Payer: BLUE CROSS/BLUE SHIELD | Admitting: Hematology and Oncology

## 2017-05-19 ENCOUNTER — Other Ambulatory Visit (HOSPITAL_BASED_OUTPATIENT_CLINIC_OR_DEPARTMENT_OTHER): Payer: BLUE CROSS/BLUE SHIELD

## 2017-05-19 ENCOUNTER — Encounter: Payer: Self-pay | Admitting: Hematology and Oncology

## 2017-05-19 ENCOUNTER — Ambulatory Visit (HOSPITAL_BASED_OUTPATIENT_CLINIC_OR_DEPARTMENT_OTHER): Payer: BLUE CROSS/BLUE SHIELD

## 2017-05-19 DIAGNOSIS — C50919 Malignant neoplasm of unspecified site of unspecified female breast: Secondary | ICD-10-CM | POA: Diagnosis not present

## 2017-05-19 DIAGNOSIS — C773 Secondary and unspecified malignant neoplasm of axilla and upper limb lymph nodes: Secondary | ICD-10-CM

## 2017-05-19 DIAGNOSIS — C50612 Malignant neoplasm of axillary tail of left female breast: Secondary | ICD-10-CM

## 2017-05-19 DIAGNOSIS — E860 Dehydration: Secondary | ICD-10-CM | POA: Diagnosis not present

## 2017-05-19 DIAGNOSIS — R197 Diarrhea, unspecified: Secondary | ICD-10-CM

## 2017-05-19 DIAGNOSIS — G4700 Insomnia, unspecified: Secondary | ICD-10-CM | POA: Diagnosis not present

## 2017-05-19 DIAGNOSIS — R12 Heartburn: Secondary | ICD-10-CM

## 2017-05-19 DIAGNOSIS — K5900 Constipation, unspecified: Secondary | ICD-10-CM

## 2017-05-19 DIAGNOSIS — Z170 Estrogen receptor positive status [ER+]: Principal | ICD-10-CM

## 2017-05-19 DIAGNOSIS — R110 Nausea: Secondary | ICD-10-CM

## 2017-05-19 LAB — CBC WITH DIFFERENTIAL/PLATELET
BASO%: 2.3 % — ABNORMAL HIGH (ref 0.0–2.0)
Basophils Absolute: 0.1 10*3/uL (ref 0.0–0.1)
EOS%: 0.9 % (ref 0.0–7.0)
Eosinophils Absolute: 0 10*3/uL (ref 0.0–0.5)
HEMATOCRIT: 46 % (ref 34.8–46.6)
HGB: 16.1 g/dL — ABNORMAL HIGH (ref 11.6–15.9)
LYMPH#: 1.2 10*3/uL (ref 0.9–3.3)
LYMPH%: 27.2 % (ref 14.0–49.7)
MCH: 30.9 pg (ref 25.1–34.0)
MCHC: 35 g/dL (ref 31.5–36.0)
MCV: 88.3 fL (ref 79.5–101.0)
MONO#: 0.3 10*3/uL (ref 0.1–0.9)
MONO%: 6.4 % (ref 0.0–14.0)
NEUT%: 63.2 % (ref 38.4–76.8)
NEUTROS ABS: 2.8 10*3/uL (ref 1.5–6.5)
PLATELETS: 268 10*3/uL (ref 145–400)
RBC: 5.21 10*6/uL (ref 3.70–5.45)
RDW: 12.8 % (ref 11.2–14.5)
WBC: 4.4 10*3/uL (ref 3.9–10.3)

## 2017-05-19 LAB — COMPREHENSIVE METABOLIC PANEL
ALT: 50 U/L (ref 0–55)
ANION GAP: 13 meq/L — AB (ref 3–11)
AST: 26 U/L (ref 5–34)
Albumin: 4.1 g/dL (ref 3.5–5.0)
Alkaline Phosphatase: 116 U/L (ref 40–150)
BILIRUBIN TOTAL: 1.08 mg/dL (ref 0.20–1.20)
BUN: 13.1 mg/dL (ref 7.0–26.0)
CALCIUM: 10.5 mg/dL — AB (ref 8.4–10.4)
CO2: 28 mEq/L (ref 22–29)
CREATININE: 0.8 mg/dL (ref 0.6–1.1)
Chloride: 91 mEq/L — ABNORMAL LOW (ref 98–109)
EGFR: 76 mL/min/{1.73_m2} — ABNORMAL LOW (ref >90)
Glucose: 118 mg/dl (ref 70–140)
Potassium: 3.6 mEq/L (ref 3.5–5.1)
Sodium: 131 mEq/L — ABNORMAL LOW (ref 136–145)
TOTAL PROTEIN: 8 g/dL (ref 6.4–8.3)

## 2017-05-19 MED ORDER — HEPARIN SOD (PORK) LOCK FLUSH 100 UNIT/ML IV SOLN
500.0000 [IU] | Freq: Once | INTRAVENOUS | Status: AC
  Administered 2017-05-19: 500 [IU] via INTRAVENOUS
  Filled 2017-05-19: qty 5

## 2017-05-19 MED ORDER — LIDOCAINE-PRILOCAINE 2.5-2.5 % EX CREA
TOPICAL_CREAM | CUTANEOUS | Status: AC
  Filled 2017-05-19: qty 5

## 2017-05-19 MED ORDER — PANTOPRAZOLE SODIUM 40 MG PO TBEC: 40.0000 mg | DELAYED_RELEASE_TABLET | Freq: Every day | ORAL | 3 refills | Status: DC

## 2017-05-19 MED ORDER — SODIUM CHLORIDE 0.9 % IV SOLN
Freq: Once | INTRAVENOUS | Status: DC
Start: 2017-05-19 — End: 2017-05-19
  Administered 2017-05-19: 12:00:00 via INTRAVENOUS

## 2017-05-19 MED ORDER — SODIUM CHLORIDE 0.9% FLUSH
10.0000 mL | Freq: Once | INTRAVENOUS | Status: AC
  Administered 2017-05-19: 10 mL via INTRAVENOUS
  Filled 2017-05-19: qty 10

## 2017-05-19 NOTE — Telephone Encounter (Signed)
Left pt message to make NP brst appt in Aug

## 2017-05-19 NOTE — Progress Notes (Signed)
Pt experienced 3 days of diarrhea s/p chemo (TCHP). In [facility] for IV hydration.

## 2017-05-19 NOTE — Progress Notes (Signed)
Patient Care Team: Deland Pretty, MD as PCP - General (Internal Medicine)  DIAGNOSIS:  Encounter Diagnosis  Name Primary?  . 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[place])   04/14/2017 Initial Diagnosis    Left axillary lymph node biopsy: Metastatic carcinoma positive for CK 7, ER positive,GATA-3 equivocal, negative for CK 20,GCDFP, CDX-2, TTF-1; HER-2 positive ratio 2.08; mammogram and ultrasound revealed 3 discrete abnormal left axillary lymph nodes largest measures 1.8 cm      04/23/2017 Breast MRI    Left axillary lymphadenopathy numerous enlarged level I axillary lymph nodes largest 2.1 cm, no other abdominal masses in the left breast itself, no MRI evidence of malignancy in the right breast      04/26/2017 Imaging    CT CAP: Prominent left axillary lymph nodes no distant metastases, sclerotic focus left pedicle of T9 vertebra favored benign, T5 vertebral hemangioma      05/12/2017 -  Neo-Adjuvant Chemotherapy    TCH Perjeta       CHIEF COMPLIANT: Cycle 1 day 8 of neoadjuvant chemotherapy with TCH Perjeta  INTERVAL HISTORY: Melissa [name] is a 56 year old with above-mentioned history left breast cancer who is here to receive neoadjuvant chemotherapy with TCH Perjeta today's cycle 1 day 8. After that for cycle she had initially severe constipation and then she took stool softeners and she had diarrhea for 45 days subsequently. He did not stop until she took 2 Imodium's which was just today. Because of his diarrhea she started have generalized fatigue and weakness. She also experienced severe heartburn and because of the heartburn she subsequently felt a lot of nausea. She did have one episode of emesis. Her diarrhea and energy levels are slowly starting to improve. She is also emotionally very frail and was tearful  today.  REVIEW OF SYSTEMS:   Constitutional: Denies fevers, chills or abnormal weight loss, generalized fatigue and weakness Eyes: Denies blurriness of vision Ears, nose, mouth, throat, and face: Denies mucositis or sore throat Respiratory: Denies cough, dyspnea or wheezes Cardiovascular: Denies palpitation, chest discomfort Gastrointestinal: Nausea and diarrhea Skin: Denies abnormal skin rashes Lymphatics: Denies new lymphadenopathy or easy bruising Neurological: Intermittent headaches Behavioral/Psych: Depressed Extremities: No lower extremity edema  All other systems were reviewed with the patient and are negative.  I have reviewed the past medical history, past surgical history, social history and family history with the patient and they are unchanged from previous note.  ALLERGIES:  has No Known Allergies.  MEDICATIONS:  Current Outpatient Prescriptions  Medication Sig Dispense Refill  . ALPRAZolam (XANAX) 0.5 MG tablet Take 0.5 mg by mouth 3 (three) times daily.  0  . calcium carbonate (OS-CAL) 1250 (500 Ca) MG chewable tablet Chew 1 tablet by mouth daily. Calcium 600 mg    . cholecalciferol (VITAMIN D) 1000 units tablet Take 1,000 Units by mouth daily. 2000 iu    . dexamethasone (DECADRON) 4 MG tablet Take 1 tablet (4 mg total) by mouth 2 (two) times daily. Take 1 tab day before chemo and 1 tab day after chemo. 30 tablet 1  . lidocaine-prilocaine (EMLA) cream Apply to affected area once 30 g 3  . LORazepam (ATIVAN) 0.5 MG tablet Take 1 tablet (0.5 mg total) by mouth at bedtime. 30 tablet 0  . ondansetron (ZOFRAN) 8 MG tablet Take 1 tablet (8 mg total)  by mouth 2 (two) times daily as needed for refractory nausea / vomiting. Start on day 3 after chemo. 30 tablet 1  . pantoprazole (PROTONIX) 40 MG tablet Take 1 tablet (40 mg total) by mouth daily. 30 tablet 3  . prochlorperazine (COMPAZINE) 10 MG tablet Take 1 tablet (10 mg total) by mouth every 6 (six) hours as needed (Nausea or  vomiting). 30 tablet 1  . rosuvastatin (CRESTOR) 10 MG tablet   0  . triamterene-hydrochlorothiazide (MAXZIDE-25) 37.5-25 MG tablet take 1/2 to 1 tablet by mouth daily IN THE MORNING  0  . valACYclovir (VALTREX) 1000 MG tablet take 2 tablets by mouth every 12 hours if needed for 10 days  0   No current facility-administered medications for this visit.    Facility-Administered Medications Ordered in Other Visits  Medication Dose Route Frequency Provider Last Rate Last Dose  . heparin lock flush 100 unit/mL  500 Units Intravenous Once Nicholas Lose, MD      . sodium chloride flush (NS) 0.9 % injection 10 mL  10 mL Intravenous Once Nicholas Lose, MD        PHYSICAL EXAMINATION: ECOG PERFORMANCE STATUS: 2 - Symptomatic, <50% confined to bed  Vitals:   05/19/17 0955  BP: (!) 148/82  Pulse: (!) 112  Resp: 18  Temp: 98.4 F (36.9 C)   Filed Weights   05/19/17 0955  Weight: 157 lb 6.4 oz (71.4 kg)    GENERAL:alert, no distress and comfortable SKIN: skin color, texture, turgor are normal, no rashes or significant lesions EYES: normal, Conjunctiva are pink and non-injected, sclera clear OROPHARYNX:no exudate, no erythema and lips, buccal mucosa, and tongue normal  NECK: supple, thyroid normal size, non-tender, without nodularity LYMPH:  no palpable lymphadenopathy in the cervical, axillary or inguinal LUNGS: clear to auscultation and percussion with normal breathing effort HEART: regular rate & rhythm and no murmurs and no lower extremity edema ABDOMEN:abdomen soft, non-tender and normal bowel sounds MUSCULOSKELETAL:no cyanosis of digits and no clubbing  NEURO: alert & oriented x 3 with fluent speech, no focal motor/sensory deficits EXTREMITIES: No lower extremity edema   LABORATORY DATA:  I have reviewed the data as listed   Chemistry      Component Value Date/Time   NA 131 (L) 05/19/2017 0936   K 3.6 05/19/2017 0936   CL 97 (L) 05/10/2017 1200   CO2 28 05/19/2017 0936    BUN 13.1 05/19/2017 0936   CREATININE 0.8 05/19/2017 0936      Component Value Date/Time   CALCIUM 10.5 (H) 05/19/2017 0936   ALKPHOS 116 05/19/2017 0936   AST 26 05/19/2017 0936   ALT 50 05/19/2017 0936   BILITOT 1.08 05/19/2017 0936       Lab Results  Component Value Date   WBC 4.4 05/19/2017   HGB 16.1 (H) 05/19/2017   HCT 46.0 05/19/2017   MCV 88.3 05/19/2017   PLT 268 05/19/2017   NEUTROABS 2.8 05/19/2017    ASSESSMENT & PLAN:  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) 04/14/2017 Left axillary lymph node biopsy: Metastatic carcinoma positive for CK 7, ER positive,GATA-3 equivocal, negative for CK 20,GCDFP, CDX-2, TTF-1; HER-2 positive ratio 2.08 mammogram and ultrasound revealed 3 discrete abnormal left axillary lymph nodes largest measures 1.8 cm Clinical stage: TX N1 MX  Treatment plan: 1. Neoadjuvant TCH Perjeta 6 cycles followed by Herceptin maintenance for 1 year 2. followed by axillary lymph node dissection. Breast surgery will not be performed because  there is no primary tumor identifiable on breast MRI 3. Followed by radiation 4. Followed by antiestrogen therapy ------------------------------------------------------------------------------------------------------------------ Current Treatment: Cycle 1 day 8 TCHP Labs reviewed Chemotherapy toxicities: 1. Constipation alternating with profound diarrhea: Patient took Imodium and it is improving 2. severe heartburn: I sent a prescription for Protonix. 3. Nausea related to chemotherapy as well as heartburn she was encouraged to take preventive Zofran or Compazine. 4. Insomnia: We she can increase the dosage of Ativan to 1 mg daily. 5. Dehydration: I will give her IV fluids with normal saline 1 L today. Follow-up 2 weekas later fcycle 2 TCH Perjeta    I spent 25 minutes talking to the patient of which more than half was spent in counseling and coordination of  care.  No orders of the defined types were placed in this encounter.  The patient has a good understanding of the overall plan. she agrees with it. she will call with any problems that may develop before the next visit here.   Rulon Eisenmenger, MD 05/19/17

## 2017-05-19 NOTE — Patient Instructions (Signed)
Dehydration, Adult Dehydration is a condition in which there is not enough fluid or water in the body. This happens when you lose more fluids than you take in. Important organs, such as the kidneys, brain, and heart, cannot function without a proper amount of fluids. Any loss of fluids from the body can lead to dehydration. Dehydration can range from mild to severe. This condition should be treated right away to prevent it from becoming severe. What are the causes? This condition may be caused by:  Vomiting.  Diarrhea.  Excessive sweating, such as from heat exposure or exercise.  Not drinking enough fluid, especially: ? When ill. ? While doing activity that requires a lot of energy.  Excessive urination.  Fever.  Infection.  Certain medicines, such as medicines that cause the body to lose excess fluid (diuretics).  Inability to access safe drinking water.  Reduced physical ability to get adequate water and food.  What increases the risk? This condition is more likely to develop in people:  Who have a poorly controlled long-term (chronic) illness, such as diabetes, heart disease, or kidney disease.  Who are age 65 or older.  Who are disabled.  Who live in a place with high altitude.  Who play endurance sports.  What are the signs or symptoms? Symptoms of mild dehydration may include:  Thirst.  Dry lips.  Slightly dry mouth.  Dry, warm skin.  Dizziness. Symptoms of moderate dehydration may include:  Very dry mouth.  Muscle cramps.  Dark urine. Urine may be the color of tea.  Decreased urine production.  Decreased tear production.  Heartbeat that is irregular or faster than normal (palpitations).  Headache.  Light-headedness, especially when you stand up from a sitting position.  Fainting (syncope). Symptoms of severe dehydration may include:  Changes in skin, such as: ? Cold and clammy skin. ? Blotchy (mottled) or pale skin. ? Skin that does  not quickly return to normal after being lightly pinched and released (poor skin turgor).  Changes in body fluids, such as: ? Extreme thirst. ? No tear production. ? Inability to sweat when body temperature is high, such as in hot weather. ? Very little urine production.  Changes in vital signs, such as: ? Weak pulse. ? Pulse that is more than 100 beats a minute when sitting still. ? Rapid breathing. ? Low blood pressure.  Other changes, such as: ? Sunken eyes. ? Cold hands and feet. ? Confusion. ? Lack of energy (lethargy). ? Difficulty waking up from sleep. ? Short-term weight loss. ? Unconsciousness. How is this diagnosed? This condition is diagnosed based on your symptoms and a physical exam. Blood and urine tests may be done to help confirm the diagnosis. How is this treated? Treatment for this condition depends on the severity. Mild or moderate dehydration can often be treated at home. Treatment should be started right away. Do not wait until dehydration becomes severe. Severe dehydration is an emergency and it needs to be treated in a hospital. Treatment for mild dehydration may include:  Drinking more fluids.  Replacing salts and minerals in your blood (electrolytes) that you may have lost. Treatment for moderate dehydration may include:  Drinking an oral rehydration solution (ORS). This is a drink that helps you replace fluids and electrolytes (rehydrate). It can be found at pharmacies and retail stores. Treatment for severe dehydration may include:  Receiving fluids through an IV tube.  Receiving an electrolyte solution through a feeding tube that is passed through your nose   and into your stomach (nasogastric tube, or NG tube).  Correcting any abnormalities in electrolytes.  Treating the underlying cause of dehydration. Follow these instructions at home:  If directed by your health care provider, drink an ORS: ? Make an ORS by following instructions on the  package. ? Start by drinking small amounts, about  cup (120 mL) every 5-10 minutes. ? Slowly increase how much you drink until you have taken the amount recommended by your health care provider.  Drink enough clear fluid to keep your urine clear or pale yellow. If you were told to drink an ORS, finish the ORS first, then start slowly drinking other clear fluids. Drink fluids such as: ? Water. Do not drink only water. Doing that can lead to having too little salt (sodium) in the body (hyponatremia). ? Ice chips. ? Fruit juice that you have added water to (diluted fruit juice). ? Low-calorie sports drinks.  Avoid: ? Alcohol. ? Drinks that contain a lot of sugar. These include high-calorie sports drinks, fruit juice that is not diluted, and soda. ? Caffeine. ? Foods that are greasy or contain a lot of fat or sugar.  Take over-the-counter and prescription medicines only as told by your health care provider.  Do not take sodium tablets. This can lead to having too much sodium in the body (hypernatremia).  Eat foods that contain a healthy balance of electrolytes, such as bananas, oranges, potatoes, tomatoes, and spinach.  Keep all follow-up visits as told by your health care provider. This is important. Contact a health care provider if:  You have abdominal pain that: ? Gets worse. ? Stays in one area (localizes).  You have a rash.  You have a stiff neck.  You are more irritable than usual.  You are sleepier or more difficult to wake up than usual.  You feel weak or dizzy.  You feel very thirsty.  You have urinated only a small amount of very dark urine over 6-8 hours. Get help right away if:  You have symptoms of severe dehydration.  You cannot drink fluids without vomiting.  Your symptoms get worse with treatment.  You have a fever.  You have a severe headache.  You have vomiting or diarrhea that: ? Gets worse. ? Does not go away.  You have blood or green matter  (bile) in your vomit.  You have blood in your stool. This may cause stool to look black and tarry.  You have not urinated in 6-8 hours.  You faint.  Your heart rate while sitting still is over 100 beats a minute.  You have trouble breathing. This information is not intended to replace advice given to you by your health care provider. Make sure you discuss any questions you have with your health care provider. Document Released: 11/23/2005 Document Revised: 06/19/2016 Document Reviewed: 01/17/2016 Elsevier Interactive Patient Education  2018 Elsevier Inc.   Diarrhea, Adult Diarrhea is frequent loose and watery bowel movements. Diarrhea can make you feel weak and cause you to become dehydrated. Dehydration can make you tired and thirsty, cause you to have a dry mouth, and decrease how often you urinate. Diarrhea typically lasts 2-3 days. However, it can last longer if it is a sign of something more serious. It is important to treat your diarrhea as told by your health care provider. Follow these instructions at home: Eating and drinking  Follow these recommendations as told by your health care provider:  Take an oral rehydration solution (ORS). This is   a drink that is sold at pharmacies and retail stores.  Drink clear fluids, such as water, ice chips, diluted fruit juice, and low-calorie sports drinks.  Eat bland, easy-to-digest foods in small amounts as you are able. These foods include bananas, applesauce, rice, lean meats, toast, and crackers.  Avoid drinking fluids that contain a lot of sugar or caffeine, such as energy drinks, sports drinks, and soda.  Avoid alcohol.  Avoid spicy or fatty foods.  General instructions  Drink enough fluid to keep your urine clear or pale yellow.  Wash your hands often. If soap and water are not available, use hand sanitizer.  Make sure that all people in your household wash their hands well and often.  Take over-the-counter and  prescription medicines only as told by your health care provider.  Rest at home while you recover.  Watch your condition for any changes.  Take a warm bath to relieve any burning or pain from frequent diarrhea episodes.  Keep all follow-up visits as told by your health care provider. This is important. Contact a health care provider if:  You have a fever.  Your diarrhea gets worse.  You have new symptoms.  You cannot keep fluids down.  You feel light-headed or dizzy.  You have a headache  You have muscle cramps. Get help right away if:  You have chest pain.  You feel extremely weak or you faint.  You have bloody or black stools or stools that look like tar.  You have severe pain, cramping, or bloating in your abdomen.  You have trouble breathing or you are breathing very quickly.  Your heart is beating very quickly.  Your skin feels cold and clammy.  You feel confused.  You have signs of dehydration, such as: ? Dark urine, very little urine, or no urine. ? Cracked lips. ? Dry mouth. ? Sunken eyes. ? Sleepiness. ? Weakness. This information is not intended to replace advice given to you by your health care provider. Make sure you discuss any questions you have with your health care provider. Document Released: 11/13/2002 Document Revised: 04/02/2016 Document Reviewed: 07/30/2015 Elsevier Interactive Patient Education  2017 Elsevier Inc.   

## 2017-05-19 NOTE — Progress Notes (Signed)
Per Dr.Gudena order, pt to receive 1L Normal Saline IVF over 1hr. Spoke with symptom management RN and notified of order. Pt given emla cream for port access prior to IV infusion.

## 2017-05-21 ENCOUNTER — Other Ambulatory Visit: Payer: Self-pay | Admitting: *Deleted

## 2017-05-21 DIAGNOSIS — C50612 Malignant neoplasm of axillary tail of left female breast: Secondary | ICD-10-CM

## 2017-05-21 DIAGNOSIS — Z170 Estrogen receptor positive status [ER+]: Principal | ICD-10-CM

## 2017-05-21 MED ORDER — DIPHENOXYLATE-ATROPINE 2.5-0.025 MG PO TABS: 1.0000 | ORAL_TABLET | Freq: Four times a day (QID) | ORAL | 0 refills | Status: DC | PRN

## 2017-05-21 NOTE — Telephone Encounter (Signed)
Received call from patient stating she is still having diarrhea.  She is drinking but not eating much.  She is staying with her daughter in [place] this weekend.  Called [place] into [facility] in [place] for her.  Encouraged her to call if this doesn't help.

## 2017-05-27 ENCOUNTER — Other Ambulatory Visit: Payer: Self-pay | Admitting: *Deleted

## 2017-05-27 DIAGNOSIS — C50612 Malignant neoplasm of axillary tail of left female breast: Secondary | ICD-10-CM

## 2017-05-27 DIAGNOSIS — Z170 Estrogen receptor positive status [ER+]: Principal | ICD-10-CM

## 2017-05-31 ENCOUNTER — Telehealth: Payer: Self-pay

## 2017-05-31 DIAGNOSIS — Z170 Estrogen receptor positive status [ER+]: Principal | ICD-10-CM

## 2017-05-31 DIAGNOSIS — C50612 Malignant neoplasm of axillary tail of left female breast: Secondary | ICD-10-CM

## 2017-05-31 MED ORDER — LORAZEPAM 0.5 MG PO TABS: 1.0000 mg | ORAL_TABLET | Freq: Every day | ORAL | 0 refills | Status: DC

## 2017-05-31 NOTE — Telephone Encounter (Signed)
Pt called that pharmacy won't refill ativan as written. Dr Lindi Adie instructed her to increase her HS dose to 2 tabs (1 mg) nightly and she has run out.  Refilled rx for 2 tabs nightly # 60 per Dr Geralyn Flash OV note of 05/19/17.

## 2017-06-01 ENCOUNTER — Ambulatory Visit: Payer: BLUE CROSS/BLUE SHIELD | Admitting: Hematology and Oncology

## 2017-06-01 ENCOUNTER — Other Ambulatory Visit: Payer: BLUE CROSS/BLUE SHIELD

## 2017-06-01 ENCOUNTER — Telehealth: Payer: Self-pay

## 2017-06-01 ENCOUNTER — Ambulatory Visit: Payer: BLUE CROSS/BLUE SHIELD

## 2017-06-01 NOTE — Telephone Encounter (Signed)
Nutrition Assessment   Reason for Assessment:   Referral for questions regarding nutrition during chemothearpy  ASSESSMENT:  56 year old female with new diagnosis of left breast cancer currently receiving neoadjuvant chemotherapy.  Patient is followed by Dr. Lindi Adie.    Received message from [place], Sarah Ann regarding patient requesting phone call.    Called patient this am.  Patient reports that during the week following chemotherapy her biggest compliant was lack of taste and most foods that she tried to eat had a bad flavor vs metallic or no taste.  Patient reports that she was still able to drink ensure original, yogurt, applesauce, apple slices with peanut butter, soups.  Reports over the last 2 weeks taste buds have come back, mostly and she has been able to eat really well and she is pleased with her progress.  She is not looking forward to upcoming chemotherapy on Friday.  Noted constipation and diarrhea but not currently having those symptoms anymore.   Medications: reviewed  Labs: reviewed  Anthropometrics:   Height: 68 inches Weight: 157 lb 6.4 oz UBW: 158-161 lb BMI: 23  Overall stable weight.  NUTRITION DIAGNOSIS: Inadequate oral intake related to chemotherapy, taste changes as evidenced by poor po intake and weight loss   MALNUTRITION DIAGNOSIS: does not meet criteria at this time   INTERVENTION:   Discussed ways to maximize nutrition on days, weeks that she is feeling good.  Discussed strategies to manage taste changes.  Will mail Taste and Smell Change handout.   Discussed oral nutrition supplements and difference in calorie and protein amount. Encouraged patient to stock up on good nutritious foods prior to chemotherapy.      MONITORING, EVALUATION, GOAL: Patient will consume adequate calories and protein to maintain weight during treatment   NEXT VISIT: as needed.  Contact information mailed  Kaide Gage B. Zenia Resides, [place], [place] Registered  Dietitian [phone] (pager)

## 2017-06-04 ENCOUNTER — Encounter: Payer: Self-pay | Admitting: Hematology and Oncology

## 2017-06-04 ENCOUNTER — Encounter: Payer: Self-pay | Admitting: *Deleted

## 2017-06-04 ENCOUNTER — Other Ambulatory Visit: Payer: Self-pay | Admitting: Hematology and Oncology

## 2017-06-04 ENCOUNTER — Other Ambulatory Visit (HOSPITAL_BASED_OUTPATIENT_CLINIC_OR_DEPARTMENT_OTHER): Payer: BLUE CROSS/BLUE SHIELD

## 2017-06-04 ENCOUNTER — Ambulatory Visit (HOSPITAL_BASED_OUTPATIENT_CLINIC_OR_DEPARTMENT_OTHER): Payer: BLUE CROSS/BLUE SHIELD

## 2017-06-04 ENCOUNTER — Ambulatory Visit (HOSPITAL_BASED_OUTPATIENT_CLINIC_OR_DEPARTMENT_OTHER): Payer: BLUE CROSS/BLUE SHIELD | Admitting: Hematology and Oncology

## 2017-06-04 ENCOUNTER — Ambulatory Visit: Payer: BLUE CROSS/BLUE SHIELD

## 2017-06-04 DIAGNOSIS — C50612 Malignant neoplasm of axillary tail of left female breast: Secondary | ICD-10-CM

## 2017-06-04 DIAGNOSIS — Z170 Estrogen receptor positive status [ER+]: Secondary | ICD-10-CM

## 2017-06-04 DIAGNOSIS — Z5111 Encounter for antineoplastic chemotherapy: Secondary | ICD-10-CM

## 2017-06-04 DIAGNOSIS — E860 Dehydration: Secondary | ICD-10-CM

## 2017-06-04 DIAGNOSIS — C50919 Malignant neoplasm of unspecified site of unspecified female breast: Secondary | ICD-10-CM

## 2017-06-04 DIAGNOSIS — C773 Secondary and unspecified malignant neoplasm of axilla and upper limb lymph nodes: Secondary | ICD-10-CM

## 2017-06-04 DIAGNOSIS — Z5112 Encounter for antineoplastic immunotherapy: Secondary | ICD-10-CM | POA: Diagnosis not present

## 2017-06-04 DIAGNOSIS — Z95828 Presence of other vascular implants and grafts: Secondary | ICD-10-CM

## 2017-06-04 DIAGNOSIS — Z5189 Encounter for other specified aftercare: Secondary | ICD-10-CM

## 2017-06-04 LAB — CBC WITH DIFFERENTIAL/PLATELET
BASO%: 0.6 % (ref 0.0–2.0)
BASOS ABS: 0.1 10*3/uL (ref 0.0–0.1)
EOS%: 0 % (ref 0.0–7.0)
Eosinophils Absolute: 0 10*3/uL (ref 0.0–0.5)
HEMATOCRIT: 38.3 % (ref 34.8–46.6)
HGB: 13.1 g/dL (ref 11.6–15.9)
LYMPH#: 2.2 10*3/uL (ref 0.9–3.3)
LYMPH%: 21.2 % (ref 14.0–49.7)
MCH: 30.9 pg (ref 25.1–34.0)
MCHC: 34.1 g/dL (ref 31.5–36.0)
MCV: 90.5 fL (ref 79.5–101.0)
MONO#: 0.4 10*3/uL (ref 0.1–0.9)
MONO%: 4.3 % (ref 0.0–14.0)
NEUT#: 7.6 10*3/uL — ABNORMAL HIGH (ref 1.5–6.5)
NEUT%: 73.9 % (ref 38.4–76.8)
Platelets: 313 10*3/uL (ref 145–400)
RBC: 4.23 10*6/uL (ref 3.70–5.45)
RDW: 13.6 % (ref 11.2–14.5)
WBC: 10.2 10*3/uL (ref 3.9–10.3)

## 2017-06-04 LAB — COMPREHENSIVE METABOLIC PANEL
ALT: 61 U/L — AB (ref 0–55)
ANION GAP: 10 meq/L (ref 3–11)
AST: 30 U/L (ref 5–34)
Albumin: 3.8 g/dL (ref 3.5–5.0)
Alkaline Phosphatase: 78 U/L (ref 40–150)
BUN: 11.6 mg/dL (ref 7.0–26.0)
CALCIUM: 10.3 mg/dL (ref 8.4–10.4)
CHLORIDE: 102 meq/L (ref 98–109)
CO2: 25 meq/L (ref 22–29)
Creatinine: 0.8 mg/dL (ref 0.6–1.1)
EGFR: 88 mL/min/{1.73_m2} — ABNORMAL LOW (ref >90)
Glucose: 170 mg/dl — ABNORMAL HIGH (ref 70–140)
POTASSIUM: 3.4 meq/L — AB (ref 3.5–5.1)
Sodium: 136 mEq/L (ref 136–145)
Total Bilirubin: 0.4 mg/dL (ref 0.20–1.20)
Total Protein: 6.9 g/dL (ref 6.4–8.3)

## 2017-06-04 MED ORDER — DEXAMETHASONE SODIUM PHOSPHATE 10 MG/ML IJ SOLN
INTRAMUSCULAR | Status: AC
  Filled 2017-06-04: qty 1

## 2017-06-04 MED ORDER — HEPARIN SOD (PORK) LOCK FLUSH 100 UNIT/ML IV SOLN
500.0000 [IU] | Freq: Once | INTRAVENOUS | Status: AC | PRN
  Administered 2017-06-04: 500 [IU]
  Filled 2017-06-04: qty 5

## 2017-06-04 MED ORDER — SODIUM CHLORIDE 0.9 % IV SOLN
420.0000 mg | Freq: Once | INTRAVENOUS | Status: AC
  Administered 2017-06-04: 420 mg via INTRAVENOUS
  Filled 2017-06-04: qty 14

## 2017-06-04 MED ORDER — DIPHENHYDRAMINE HCL 25 MG PO CAPS
ORAL_CAPSULE | ORAL | Status: AC
  Filled 2017-06-04: qty 2

## 2017-06-04 MED ORDER — SODIUM CHLORIDE 0.9 % IV SOLN
75.0000 mg/m2 | Freq: Once | INTRAVENOUS | Status: AC
  Administered 2017-06-04: 140 mg via INTRAVENOUS
  Filled 2017-06-04: qty 14

## 2017-06-04 MED ORDER — SODIUM CHLORIDE 0.9 % IV SOLN
560.0000 mg | Freq: Once | INTRAVENOUS | Status: AC
  Administered 2017-06-04: 560 mg via INTRAVENOUS
  Filled 2017-06-04: qty 56

## 2017-06-04 MED ORDER — DIPHENHYDRAMINE HCL 25 MG PO CAPS
50.0000 mg | ORAL_CAPSULE | Freq: Once | ORAL | Status: AC
Start: 2017-06-04 — End: 2017-06-04
  Administered 2017-06-04: 50 mg via ORAL

## 2017-06-04 MED ORDER — ACETAMINOPHEN 325 MG PO TABS
ORAL_TABLET | ORAL | Status: AC
  Filled 2017-06-04: qty 2

## 2017-06-04 MED ORDER — SODIUM CHLORIDE 0.9 % IV SOLN
Freq: Once | INTRAVENOUS | Status: AC
  Administered 2017-06-04: 10:00:00 via INTRAVENOUS

## 2017-06-04 MED ORDER — TRASTUZUMAB CHEMO 150 MG IV SOLR
450.0000 mg | Freq: Once | INTRAVENOUS | Status: AC
  Administered 2017-06-04: 450 mg via INTRAVENOUS
  Filled 2017-06-04: qty 21.43

## 2017-06-04 MED ORDER — PALONOSETRON HCL INJECTION 0.25 MG/5ML
INTRAVENOUS | Status: AC
  Filled 2017-06-04: qty 5

## 2017-06-04 MED ORDER — PALONOSETRON HCL INJECTION 0.25 MG/5ML
0.2500 mg | Freq: Once | INTRAVENOUS | Status: AC
Start: 2017-06-04 — End: 2017-06-04
  Administered 2017-06-04: 0.25 mg via INTRAVENOUS

## 2017-06-04 MED ORDER — SODIUM CHLORIDE 0.9% FLUSH
10.0000 mL | INTRAVENOUS | Status: DC | PRN
  Administered 2017-06-04: 10 mL
  Filled 2017-06-04: qty 10

## 2017-06-04 MED ORDER — PEGFILGRASTIM 6 MG/0.6ML ~~LOC~~ PSKT
6.0000 mg | PREFILLED_SYRINGE | Freq: Once | SUBCUTANEOUS | Status: AC
  Administered 2017-06-04: 6 mg via SUBCUTANEOUS
  Filled 2017-06-04: qty 0.6

## 2017-06-04 MED ORDER — SODIUM CHLORIDE 0.9% FLUSH
10.0000 mL | INTRAVENOUS | Status: DC | PRN
  Administered 2017-06-04: 10 mL via INTRAVENOUS
  Filled 2017-06-04: qty 10

## 2017-06-04 MED ORDER — DEXAMETHASONE SODIUM PHOSPHATE 10 MG/ML IJ SOLN
10.0000 mg | Freq: Once | INTRAMUSCULAR | Status: AC
  Administered 2017-06-04: 10 mg via INTRAVENOUS

## 2017-06-04 MED ORDER — ACETAMINOPHEN 325 MG PO TABS
650.0000 mg | ORAL_TABLET | Freq: Once | ORAL | Status: AC
  Administered 2017-06-04: 650 mg via ORAL

## 2017-06-04 NOTE — Patient Instructions (Signed)
[facility] Discharge Instructions for Patients Receiving Chemotherapy  Today you received the following chemotherapy agents; Herceptin, Perjeta, Taxotere and Carboplatin.   To help prevent nausea and vomiting after your treatment, we encourage you to take your nausea medication as directed.    If you develop nausea and vomiting that is not controlled by your nausea medication, call the clinic.   BELOW ARE SYMPTOMS THAT SHOULD BE REPORTED IMMEDIATELY:  *FEVER GREATER THAN 100.5 F  *CHILLS WITH OR WITHOUT FEVER  NAUSEA AND VOMITING THAT IS NOT CONTROLLED WITH YOUR NAUSEA MEDICATION  *UNUSUAL SHORTNESS OF BREATH  *UNUSUAL BRUISING OR BLEEDING  TENDERNESS IN MOUTH AND THROAT WITH OR WITHOUT PRESENCE OF ULCERS  *URINARY PROBLEMS  *BOWEL PROBLEMS  UNUSUAL RASH Items with * indicate a potential emergency and should be followed up as soon as possible.  Feel free to call the clinic you have any questions or concerns. The clinic phone number is [phone].  Please show the CHEMO ALERT CARD at check-in to the Emergency Department and triage nurse.   

## 2017-06-04 NOTE — Assessment & Plan Note (Signed)
04/14/2017 Left axillary lymph node biopsy: Metastatic carcinoma positive for CK 7, ER positive,GATA-3 equivocal, negative for CK 20,GCDFP, CDX-2, TTF-1; HER-2 positive ratio 2.08 mammogram and ultrasound revealed 3 discrete abnormal left axillary lymph nodes largest measures 1.8 cm Clinical stage: [place] N1MX  Treatment plan: 1. Neoadjuvant TCH Perjeta 6 cycles followed by Herceptin maintenance for 1 year 2. followed by axillary lymph node dissection. Breast surgery will not be performed because there is no primary tumor identifiable on breast MRI 3. Followed by radiation 4. Followed by antiestrogen therapy ------------------------------------------------------------------------------------------------------------------ Current Treatment: Cycle 2 day 1 TCHP Labs reviewed Chemotherapy toxicities: 1. Constipation alternating with profound diarrhea: Patient took Imodium and it is improving 2. severe heartburn: On Protonix. 3. Nausea related to chemotherapy: Zofran or Compazine. 4. Insomnia: Ativan to 1 mg daily. 5. Dehydration:Gave her IV fluids with normal saline 1 L.  Return to clinic in 3 weeks for cycle 3 Follow-up 2 weekas later fcycle 2 [place] Perjeta

## 2017-06-04 NOTE — Progress Notes (Signed)
Patient Care Team: Deland Pretty, MD as PCP - General (Internal Medicine)  DIAGNOSIS:  Encounter Diagnosis  Name Primary?  . 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[place])   04/14/2017 Initial Diagnosis    Left axillary lymph node biopsy: Metastatic carcinoma positive for CK 7, ER positive,GATA-3 equivocal, negative for CK 20,GCDFP, CDX-2, TTF-1; HER-2 positive ratio 2.08; mammogram and ultrasound revealed 3 discrete abnormal left axillary lymph nodes largest measures 1.8 cm      04/23/2017 Breast MRI    Left axillary lymphadenopathy numerous enlarged level I axillary lymph nodes largest 2.1 cm, no other abdominal masses in the left breast itself, no MRI evidence of malignancy in the right breast      04/26/2017 Imaging    CT CAP: Prominent left axillary lymph nodes no distant metastases, sclerotic focus left pedicle of T9 vertebra favored benign, T5 vertebral hemangioma      05/12/2017 -  Neo-Adjuvant Chemotherapy    TCH Perjeta       CHIEF COMPLIANT: Cycle 2 TCH Perjeta  INTERVAL HISTORY: Melissa [name] is a 56 year old with above-mentioned history left breast cancer who is here for cycle 2 TCH Perjeta. After the first cycle she had profound diarrhea and dehydration. She was given IV fluids and felt remarkably well. She had very good last couple weeks. Her appetite and taste come back and she was able to eat very well. She did not have any further nausea vomiting. Her diarrhea was also much better controlled with using Imodium and Lomotil. Gastritis is very well controlled with Protonix.  REVIEW OF SYSTEMS:   Constitutional: Denies fevers, chills or abnormal weight loss Eyes: Denies blurriness of vision Ears, nose, mouth, throat, and face: Denies mucositis or sore throat Respiratory: Denies cough, dyspnea or  wheezes Cardiovascular: Denies palpitation, chest discomfort Gastrointestinal: Diarrhea subsided as well as gastritis Skin: Denies abnormal skin rashes Lymphatics: Denies new lymphadenopathy or easy bruising Neurological:Denies numbness, tingling or new weaknesses Behavioral/Psych: Mood is stable, no new changes  Extremities: No lower extremity edema All other systems were reviewed with the patient and are negative.  I have reviewed the past medical history, past surgical history, social history and family history with the patient and they are unchanged from previous note.  ALLERGIES:  has No Known Allergies.  MEDICATIONS:  Current Outpatient Prescriptions  Medication Sig Dispense Refill  . ALPRAZolam (XANAX) 0.5 MG tablet Take 0.5 mg by mouth 3 (three) times daily.  0  . calcium carbonate (OS-CAL) 1250 (500 Ca) MG chewable tablet Chew 1 tablet by mouth daily. Calcium 600 mg    . cholecalciferol (VITAMIN D) 1000 units tablet Take 1,000 Units by mouth daily. 2000 iu    . dexamethasone (DECADRON) 4 MG tablet Take 1 tablet (4 mg total) by mouth 2 (two) times daily. Take 1 tab day before chemo and 1 tab day after chemo. 30 tablet 1  . diphenoxylate-atropine (LOMOTIL) 2.5-0.025 MG tablet Take 1 tablet by mouth 4 (four) times daily as needed for diarrhea or loose stools. 30 tablet 0  . lidocaine-prilocaine (EMLA) cream Apply to affected area once 30 g 3  . LORazepam (ATIVAN) 0.5 MG tablet Take 2 tablets (1 mg total) by mouth at bedtime. 60 tablet 0  . ondansetron (ZOFRAN) 8 MG tablet Take 1 tablet (8 mg total) by mouth 2 (two)  times daily as needed for refractory nausea / vomiting. Start on day 3 after chemo. 30 tablet 1  . pantoprazole (PROTONIX) 40 MG tablet Take 1 tablet (40 mg total) by mouth daily. 30 tablet 3  . prochlorperazine (COMPAZINE) 10 MG tablet Take 1 tablet (10 mg total) by mouth every 6 (six) hours as needed (Nausea or vomiting). 30 tablet 1  . rosuvastatin (CRESTOR) 10 MG  tablet   0  . triamterene-hydrochlorothiazide (MAXZIDE-25) 37.5-25 MG tablet take 1/2 to 1 tablet by mouth daily IN THE MORNING  0  . valACYclovir (VALTREX) 1000 MG tablet take 2 tablets by mouth every 12 hours if needed for 10 days  0   No current facility-administered medications for this visit.     PHYSICAL EXAMINATION: ECOG PERFORMANCE STATUS: 1 - Symptomatic but completely ambulatory  Vitals:   06/04/17 0806  BP: 137/69  Pulse: 77  Resp: 18  Temp: 97.7 F (36.5 C)   Filed Weights   06/04/17 0806  Weight: 157 lb 11.2 oz (71.5 kg)    GENERAL:alert, no distress and comfortable SKIN: skin color, texture, turgor are normal, no rashes or significant lesions EYES: normal, Conjunctiva are pink and non-injected, sclera clear OROPHARYNX:no exudate, no erythema and lips, buccal mucosa, and tongue normal  NECK: supple, thyroid normal size, non-tender, without nodularity LYMPH:  no palpable lymphadenopathy in the cervical, axillary or inguinal LUNGS: clear to auscultation and percussion with normal breathing effort HEART: regular rate & rhythm and no murmurs and no lower extremity edema ABDOMEN:abdomen soft, non-tender and normal bowel sounds MUSCULOSKELETAL:no cyanosis of digits and no clubbing  NEURO: alert & oriented x 3 with fluent speech, no focal motor/sensory deficits EXTREMITIES: No lower extremity edema  LABORATORY DATA:  I have reviewed the data as listed   Chemistry      Component Value Date/Time   NA 131 (L) 05/19/2017 0936   K 3.6 05/19/2017 0936   CL 97 (L) 05/10/2017 1200   CO2 28 05/19/2017 0936   BUN 13.1 05/19/2017 0936   CREATININE 0.8 05/19/2017 0936      Component Value Date/Time   CALCIUM 10.5 (H) 05/19/2017 0936   ALKPHOS 116 05/19/2017 0936   AST 26 05/19/2017 0936   ALT 50 05/19/2017 0936   BILITOT 1.08 05/19/2017 0936       Lab Results  Component Value Date   WBC 4.4 05/19/2017   HGB 16.1 (H) 05/19/2017   HCT 46.0 05/19/2017   MCV 88.3  05/19/2017   PLT 268 05/19/2017   NEUTROABS 2.8 05/19/2017    ASSESSMENT & PLAN:  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 Neoadjuvant TCH Perjeta 6 cycles followed by Herceptin maintenance for 1 year 2. followed by axillary lymph node dissection. Breast surgery will not be performed because there is no primary tumor identifiable on breast MRI 3. Followed by radiation 4. Followed by antiestrogen therapy ------------------------------------------------------------------------------------------------------------------ Current Treatment: Cycle 2 day 1 TCHP Labs reviewed  Chemotherapy toxicities: 1. Constipation alternating with profound diarrhea: Patient took Imodium and it is improving 2. severe heartburn: On Protonix. 3. Nausea related to chemotherapy: Zofran or Compazine. 4. Insomnia: Ativan to 1 mg daily. 5. Dehydration:Gave her IV fluids  with normal saline 1 L.  Return to clinic in 3 weeks for cycle 3  I spent 25 minutes talking to the patient of which more than half was spent in counseling and coordination of care.  No orders of the defined types were placed in this encounter.  The patient has a good understanding of the overall plan. she agrees with it. she will call with any problems that may develop before the next visit here.   Rulon Eisenmenger, MD 06/04/17

## 2017-06-04 NOTE — Patient Instructions (Signed)
Implanted Port Home Guide An implanted port is a type of central line that is placed under the skin. Central lines are used to provide IV access when treatment or nutrition needs to be given through a person's veins. Implanted ports are used for long-term IV access. An implanted port may be placed because:  You need IV medicine that would be irritating to the small veins in your hands or arms.  You need long-term IV medicines, such as antibiotics.  You need IV nutrition for a long period.  You need frequent blood draws for lab tests.  You need dialysis.  Implanted ports are usually placed in the chest area, but they can also be placed in the upper arm, the abdomen, or the leg. An implanted port has two main parts:  Reservoir. The reservoir is round and will appear as a small, raised area under your skin. The reservoir is the part where a needle is inserted to give medicines or draw blood.  Catheter. The catheter is a thin, flexible tube that extends from the reservoir. The catheter is placed into a large vein. Medicine that is inserted into the reservoir goes into the catheter and then into the vein.  How will I care for my incision site? Do not get the incision site wet. Bathe or shower as directed by your health care provider. How is my port accessed? Special steps must be taken to access the port:  Before the port is accessed, a numbing cream can be placed on the skin. This helps numb the skin over the port site.  Your health care provider uses a sterile technique to access the port. ? Your health care provider must put on a mask and sterile gloves. ? The skin over your port is cleaned carefully with an antiseptic and allowed to dry. ? The port is gently pinched between sterile gloves, and a needle is inserted into the port.  Only "non-coring" port needles should be used to access the port. Once the port is accessed, a blood return should be checked. This helps ensure that the port  is in the vein and is not clogged.  If your port needs to remain accessed for a constant infusion, a clear (transparent) bandage will be placed over the needle site. The bandage and needle will need to be changed every week, or as directed by your health care provider.  Keep the bandage covering the needle clean and dry. Do not get it wet. Follow your health care provider's instructions on how to take a shower or bath while the port is accessed.  If your port does not need to stay accessed, no bandage is needed over the port.  What is flushing? Flushing helps keep the port from getting clogged. Follow your health care provider's instructions on how and when to flush the port. Ports are usually flushed with saline solution or a medicine called heparin. The need for flushing will depend on how the port is used.  If the port is used for intermittent medicines or blood draws, the port will need to be flushed: ? After medicines have been given. ? After blood has been drawn. ? As part of routine maintenance.  If a constant infusion is running, the port may not need to be flushed.  How long will my port stay implanted? The port can stay in for as long as your health care provider thinks it is needed. When it is time for the port to come out, surgery will be   done to remove it. The procedure is similar to the one performed when the port was put in. When should I seek immediate medical care? When you have an implanted port, you should seek immediate medical care if:  You notice a bad smell coming from the incision site.  You have swelling, redness, or drainage at the incision site.  You have more swelling or pain at the port site or the surrounding area.  You have a fever that is not controlled with medicine.  This information is not intended to replace advice given to you by your health care provider. Make sure you discuss any questions you have with your health care provider. Document  Released: 11/23/2005 Document Revised: 04/30/2016 Document Reviewed: 07/31/2013 Elsevier Interactive Patient Education  2017 Else[employer] Inc.  

## 2017-06-11 ENCOUNTER — Other Ambulatory Visit (HOSPITAL_BASED_OUTPATIENT_CLINIC_OR_DEPARTMENT_OTHER): Payer: BLUE CROSS/BLUE SHIELD

## 2017-06-11 ENCOUNTER — Telehealth: Payer: Self-pay | Admitting: *Deleted

## 2017-06-11 ENCOUNTER — Other Ambulatory Visit: Payer: Self-pay | Admitting: *Deleted

## 2017-06-11 ENCOUNTER — Ambulatory Visit: Payer: Self-pay | Admitting: *Deleted

## 2017-06-11 ENCOUNTER — Ambulatory Visit (HOSPITAL_BASED_OUTPATIENT_CLINIC_OR_DEPARTMENT_OTHER): Payer: BLUE CROSS/BLUE SHIELD

## 2017-06-11 VITALS — BP 165/75 | HR 79 | Temp 98.4°F | Resp 18

## 2017-06-11 DIAGNOSIS — C50919 Malignant neoplasm of unspecified site of unspecified female breast: Secondary | ICD-10-CM

## 2017-06-11 DIAGNOSIS — C50612 Malignant neoplasm of axillary tail of left female breast: Secondary | ICD-10-CM

## 2017-06-11 DIAGNOSIS — Z170 Estrogen receptor positive status [ER+]: Principal | ICD-10-CM

## 2017-06-11 DIAGNOSIS — Z95828 Presence of other vascular implants and grafts: Secondary | ICD-10-CM

## 2017-06-11 DIAGNOSIS — E860 Dehydration: Secondary | ICD-10-CM

## 2017-06-11 LAB — COMPREHENSIVE METABOLIC PANEL
ALBUMIN: 4 g/dL (ref 3.5–5.0)
ALK PHOS: 93 U/L (ref 40–150)
ALT: 61 U/L — ABNORMAL HIGH (ref 0–55)
ANION GAP: 9 meq/L (ref 3–11)
AST: 31 U/L (ref 5–34)
BILIRUBIN TOTAL: 0.54 mg/dL (ref 0.20–1.20)
BUN: 9.3 mg/dL (ref 7.0–26.0)
CO2: 30 mEq/L — ABNORMAL HIGH (ref 22–29)
Calcium: 10 mg/dL (ref 8.4–10.4)
Chloride: 92 mEq/L — ABNORMAL LOW (ref 98–109)
Creatinine: 0.8 mg/dL (ref 0.6–1.1)
EGFR: 80 mL/min/{1.73_m2} — AB (ref >90)
Glucose: 101 mg/dl (ref 70–140)
Potassium: 3.2 mEq/L — ABNORMAL LOW (ref 3.5–5.1)
Sodium: 131 mEq/L — ABNORMAL LOW (ref 136–145)
Total Protein: 6.9 g/dL (ref 6.4–8.3)

## 2017-06-11 LAB — CBC WITH DIFFERENTIAL/PLATELET
BASO%: 0.7 % (ref 0.0–2.0)
BASOS ABS: 0.1 10*3/uL (ref 0.0–0.1)
EOS ABS: 0 10*3/uL (ref 0.0–0.5)
EOS%: 0.2 % (ref 0.0–7.0)
HCT: 37.5 % (ref 34.8–46.6)
HEMOGLOBIN: 12.9 g/dL (ref 11.6–15.9)
LYMPH%: 28.8 % (ref 14.0–49.7)
MCH: 30.4 pg (ref 25.1–34.0)
MCHC: 34.4 g/dL (ref 31.5–36.0)
MCV: 88.2 fL (ref 79.5–101.0)
MONO#: 1.6 10*3/uL — AB (ref 0.1–0.9)
MONO%: 17.9 % — ABNORMAL HIGH (ref 0.0–14.0)
NEUT%: 52.4 % (ref 38.4–76.8)
NEUTROS ABS: 4.7 10*3/uL (ref 1.5–6.5)
PLATELETS: 305 10*3/uL (ref 145–400)
RBC: 4.25 10*6/uL (ref 3.70–5.45)
RDW: 13.4 % (ref 11.2–14.5)
WBC: 9 10*3/uL (ref 3.9–10.3)
lymph#: 2.6 10*3/uL (ref 0.9–3.3)

## 2017-06-11 MED ORDER — SODIUM CHLORIDE 0.9% FLUSH
10.0000 mL | INTRAVENOUS | Status: DC | PRN
  Administered 2017-06-11: 10 mL via INTRAVENOUS
  Filled 2017-06-11: qty 10

## 2017-06-11 MED ORDER — SODIUM CHLORIDE 0.9% FLUSH
10.0000 mL | Freq: Once | INTRAVENOUS | Status: AC
  Administered 2017-06-11: 10 mL via INTRAVENOUS
  Filled 2017-06-11: qty 10

## 2017-06-11 MED ORDER — HEPARIN SOD (PORK) LOCK FLUSH 100 UNIT/ML IV SOLN
500.0000 [IU] | Freq: Once | INTRAVENOUS | Status: AC
  Administered 2017-06-11: 500 [IU] via INTRAVENOUS
  Filled 2017-06-11: qty 5

## 2017-06-11 MED ORDER — SODIUM CHLORIDE 0.9 % IV SOLN
INTRAVENOUS | Status: AC
  Administered 2017-06-11: 16:00:00 via INTRAVENOUS

## 2017-06-11 NOTE — Telephone Encounter (Signed)
Received call from patient stating she is not feeling well.  She has diarrhea and has been taking the Lomotil.  She feels like she need some fluids. She is going out of town on Sunday.  She is coming in today for labs and IVF's.

## 2017-06-11 NOTE — Progress Notes (Signed)
Pt BP elevated post IVF. Pt stated this happened after her last infusion. Pt does not feel light-headed, no HA or pains, HR WNL. Pt encouraged to call with any changes.

## 2017-06-11 NOTE — Patient Instructions (Signed)
Dehydration, Adult Dehydration is when there is not enough fluid or water in your body. This happens when you lose more fluids than you take in. Dehydration can range from mild to very bad. It should be treated right away to keep it from getting very bad. Symptoms of mild dehydration may include:  Thirst.  Dry lips.  Slightly dry mouth.  Dry, warm skin.  Dizziness. Symptoms of moderate dehydration may include:  Very dry mouth.  Muscle cramps.  Dark pee (urine). Pee may be the color of tea.  Your body making less pee.  Your eyes making fewer tears.  Heartbeat that is uneven or faster than normal (palpitations).  Headache.  Light-headedness, especially when you stand up from sitting.  Fainting (syncope). Symptoms of very bad dehydration may include:  Changes in skin, such as: ? Cold and clammy skin. ? Blotchy (mottled) or pale skin. ? Skin that does not quickly return to normal after being lightly pinched and let go (poor skin turgor).  Changes in body fluids, such as: ? Feeling very thirsty. ? Your eyes making fewer tears. ? Not sweating when body temperature is high, such as in hot weather. ? Your body making very little pee.  Changes in vital signs, such as: ? Weak pulse. ? Pulse that is more than 100 beats a minute when you are sitting still. ? Fast breathing. ? Low blood pressure.  Other changes, such as: ? Sunken eyes. ? Cold hands and feet. ? Confusion. ? Lack of energy (lethargy). ? Trouble waking up from sleep. ? Short-term weight loss. ? Unconsciousness. Follow these instructions at home:  If told by your doctor, drink an ORS: ? Make an ORS by using instructions on the package. ? Start by drinking small amounts, about  cup (120 mL) every 5-10 minutes. ? Slowly drink more until you have had the amount that your doctor said to have.  Drink enough clear fluid to keep your pee clear or pale yellow. If you were told to drink an ORS, finish the ORS  first, then start slowly drinking clear fluids. Drink fluids such as: ? Water. Do not drink only water by itself. Doing that can make the salt (sodium) level in your body get too low (hyponatremia). ? Ice chips. ? Fruit juice that you have added water to (diluted). ? Low-calorie sports drinks.  Avoid: ? Alcohol. ? Drinks that have a lot of sugar. These include high-calorie sports drinks, fruit juice that does not have water added, and soda. ? Caffeine. ? Foods that are greasy or have a lot of fat or sugar.  Take over-the-counter and prescription medicines only as told by your doctor.  Do not take salt tablets. Doing that can make the salt level in your body get too high (hypernatremia).  Eat foods that have minerals (electrolytes). Examples include bananas, oranges, potatoes, tomatoes, and spinach.  Keep all follow-up visits as told by your doctor. This is important. Contact a doctor if:  You have belly (abdominal) pain that: ? Gets worse. ? Stays in one area (localizes).  You have a rash.  You have a stiff neck.  You get angry or annoyed more easily than normal (irritability).  You are more sleepy than normal.  You have a harder time waking up than normal.  You feel: ? Weak. ? Dizzy. ? Very thirsty.  You have peed (urinated) only a small amount of very dark pee during 6-8 hours. Get help right away if:  You have symptoms of   very bad dehydration.  You cannot drink fluids without throwing up (vomiting).  Your symptoms get worse with treatment.  You have a fever.  You have a very bad headache.  You are throwing up or having watery poop (diarrhea) and it: ? Gets worse. ? Does not go away.  You have blood or something green (bile) in your throw-up.  You have blood in your poop (stool). This may cause poop to look black and tarry.  You have not peed in 6-8 hours.  You pass out (faint).  Your heart rate when you are sitting still is more than 100 beats a  minute.  You have trouble breathing. This information is not intended to replace advice given to you by your health care provider. Make sure you discuss any questions you have with your health care provider. Document Released: 09/19/2009 Document Revised: 06/12/2016 Document Reviewed: 01/17/2016 Elsevier Interactive Patient Education  2018 [employer] Inc.  

## 2017-06-15 ENCOUNTER — Telehealth: Payer: Self-pay | Admitting: *Deleted

## 2017-06-15 NOTE — Telephone Encounter (Signed)
"  Could Dr. Lindi Adie call in something for me.  I have a scratchy sore throat, runny nose, difficult dry cough that was consistent over night.  I've checked regularly and no fever.  No sores or spots in my mouth and no pain anywhere except throat.  Last chemotherapy was received on 06-04-2017.  Tried using cough lozenges.  Drinking 64 oz liquid daily of water, Gatorade and more.  No nausea.  Not eating much because nothing taste good anymore.  Return number is [phone]."  Will notify MD.  Nurse instructions to increase water to 64 oz in addition to other beverages and treat symptoms.  Robitussin has a syrup for cough, sore throat and congestion.  Mucinex DM is for cough and congestion.

## 2017-06-15 NOTE — Telephone Encounter (Signed)
Called pt to follow up and reinforced hydration, temp checks, and adding antihistamine to help with post nasal congestion. Pt states that the triage nurse advised her on some cough medication she can get over the counter to help with dry cough at night. Told pt to call if symptoms does not improve or if symptoms gets worse over the next few days.

## 2017-06-21 ENCOUNTER — Other Ambulatory Visit: Payer: BLUE CROSS/BLUE SHIELD

## 2017-06-21 ENCOUNTER — Ambulatory Visit: Payer: BLUE CROSS/BLUE SHIELD | Admitting: Hematology and Oncology

## 2017-06-21 ENCOUNTER — Ambulatory Visit: Payer: BLUE CROSS/BLUE SHIELD

## 2017-06-28 ENCOUNTER — Other Ambulatory Visit: Payer: Self-pay | Admitting: *Deleted

## 2017-06-29 ENCOUNTER — Encounter: Payer: Self-pay | Admitting: *Deleted

## 2017-06-29 ENCOUNTER — Ambulatory Visit: Payer: BLUE CROSS/BLUE SHIELD

## 2017-06-29 ENCOUNTER — Other Ambulatory Visit (HOSPITAL_BASED_OUTPATIENT_CLINIC_OR_DEPARTMENT_OTHER): Payer: BLUE CROSS/BLUE SHIELD

## 2017-06-29 ENCOUNTER — Encounter: Payer: Self-pay | Admitting: Hematology and Oncology

## 2017-06-29 ENCOUNTER — Ambulatory Visit (HOSPITAL_BASED_OUTPATIENT_CLINIC_OR_DEPARTMENT_OTHER): Payer: BLUE CROSS/BLUE SHIELD | Admitting: Hematology and Oncology

## 2017-06-29 ENCOUNTER — Other Ambulatory Visit: Payer: Self-pay | Admitting: *Deleted

## 2017-06-29 ENCOUNTER — Ambulatory Visit (HOSPITAL_BASED_OUTPATIENT_CLINIC_OR_DEPARTMENT_OTHER): Payer: BLUE CROSS/BLUE SHIELD

## 2017-06-29 DIAGNOSIS — R197 Diarrhea, unspecified: Secondary | ICD-10-CM | POA: Diagnosis not present

## 2017-06-29 DIAGNOSIS — C801 Malignant (primary) neoplasm, unspecified: Secondary | ICD-10-CM

## 2017-06-29 DIAGNOSIS — C50612 Malignant neoplasm of axillary tail of left female breast: Secondary | ICD-10-CM

## 2017-06-29 DIAGNOSIS — G4700 Insomnia, unspecified: Secondary | ICD-10-CM

## 2017-06-29 DIAGNOSIS — Z95828 Presence of other vascular implants and grafts: Secondary | ICD-10-CM

## 2017-06-29 DIAGNOSIS — K5900 Constipation, unspecified: Secondary | ICD-10-CM

## 2017-06-29 DIAGNOSIS — R12 Heartburn: Secondary | ICD-10-CM

## 2017-06-29 DIAGNOSIS — E860 Dehydration: Secondary | ICD-10-CM

## 2017-06-29 DIAGNOSIS — C773 Secondary and unspecified malignant neoplasm of axilla and upper limb lymph nodes: Secondary | ICD-10-CM

## 2017-06-29 DIAGNOSIS — Z5111 Encounter for antineoplastic chemotherapy: Secondary | ICD-10-CM | POA: Diagnosis not present

## 2017-06-29 DIAGNOSIS — M898X9 Other specified disorders of bone, unspecified site: Secondary | ICD-10-CM | POA: Diagnosis not present

## 2017-06-29 DIAGNOSIS — Z170 Estrogen receptor positive status [ER+]: Principal | ICD-10-CM

## 2017-06-29 DIAGNOSIS — Z5112 Encounter for antineoplastic immunotherapy: Secondary | ICD-10-CM | POA: Diagnosis not present

## 2017-06-29 DIAGNOSIS — R110 Nausea: Secondary | ICD-10-CM

## 2017-06-29 LAB — CBC WITH DIFFERENTIAL/PLATELET
BASO%: 0.4 % (ref 0.0–2.0)
BASOS ABS: 0 10*3/uL (ref 0.0–0.1)
EOS%: 0.1 % (ref 0.0–7.0)
Eosinophils Absolute: 0 10*3/uL (ref 0.0–0.5)
HCT: 36.3 % (ref 34.8–46.6)
HEMOGLOBIN: 12.6 g/dL (ref 11.6–15.9)
LYMPH%: 26.5 % (ref 14.0–49.7)
MCH: 31.6 pg (ref 25.1–34.0)
MCHC: 34.7 g/dL (ref 31.5–36.0)
MCV: 91 fL (ref 79.5–101.0)
MONO#: 0.6 10*3/uL (ref 0.1–0.9)
MONO%: 6.1 % (ref 0.0–14.0)
NEUT#: 6.4 10*3/uL (ref 1.5–6.5)
NEUT%: 66.9 % (ref 38.4–76.8)
Platelets: 382 10*3/uL (ref 145–400)
RBC: 3.99 10*6/uL (ref 3.70–5.45)
RDW: 15.3 % — AB (ref 11.2–14.5)
WBC: 9.6 10*3/uL (ref 3.9–10.3)
lymph#: 2.6 10*3/uL (ref 0.9–3.3)

## 2017-06-29 LAB — COMPREHENSIVE METABOLIC PANEL
ALBUMIN: 4 g/dL (ref 3.5–5.0)
ALK PHOS: 73 U/L (ref 40–150)
ALT: 40 U/L (ref 0–55)
AST: 23 U/L (ref 5–34)
Anion Gap: 12 mEq/L — ABNORMAL HIGH (ref 3–11)
BILIRUBIN TOTAL: 0.6 mg/dL (ref 0.20–1.20)
BUN: 13.5 mg/dL (ref 7.0–26.0)
CO2: 26 mEq/L (ref 22–29)
CREATININE: 0.8 mg/dL (ref 0.6–1.1)
Calcium: 10.4 mg/dL (ref 8.4–10.4)
Chloride: 100 mEq/L (ref 98–109)
EGFR: 85 mL/min/{1.73_m2} — AB (ref >90)
Glucose: 137 mg/dl (ref 70–140)
Potassium: 3.2 mEq/L — ABNORMAL LOW (ref 3.5–5.1)
SODIUM: 138 meq/L (ref 136–145)
Total Protein: 7.2 g/dL (ref 6.4–8.3)

## 2017-06-29 MED ORDER — DEXAMETHASONE SODIUM PHOSPHATE 10 MG/ML IJ SOLN
INTRAMUSCULAR | Status: AC
  Filled 2017-06-29: qty 1

## 2017-06-29 MED ORDER — SODIUM CHLORIDE 0.9 % IV SOLN
Freq: Once | INTRAVENOUS | Status: AC
  Administered 2017-06-29: 10:00:00 via INTRAVENOUS

## 2017-06-29 MED ORDER — SODIUM CHLORIDE 0.9% FLUSH
10.0000 mL | INTRAVENOUS | Status: DC | PRN
  Administered 2017-06-29: 10 mL via INTRAVENOUS
  Filled 2017-06-29: qty 10

## 2017-06-29 MED ORDER — DIPHENHYDRAMINE HCL 25 MG PO CAPS
ORAL_CAPSULE | ORAL | Status: AC
  Filled 2017-06-29: qty 2

## 2017-06-29 MED ORDER — ACETAMINOPHEN 325 MG PO TABS
ORAL_TABLET | ORAL | Status: AC
  Filled 2017-06-29: qty 2

## 2017-06-29 MED ORDER — PEGFILGRASTIM 6 MG/0.6ML ~~LOC~~ PSKT
6.0000 mg | PREFILLED_SYRINGE | Freq: Once | SUBCUTANEOUS | Status: AC
  Administered 2017-06-29: 6 mg via SUBCUTANEOUS
  Filled 2017-06-29: qty 0.6

## 2017-06-29 MED ORDER — PALONOSETRON HCL INJECTION 0.25 MG/5ML
0.2500 mg | Freq: Once | INTRAVENOUS | Status: AC
Start: 2017-06-29 — End: 2017-06-29
  Administered 2017-06-29: 0.25 mg via INTRAVENOUS

## 2017-06-29 MED ORDER — SODIUM CHLORIDE 0.9% FLUSH
10.0000 mL | INTRAVENOUS | Status: DC | PRN
  Administered 2017-06-29: 10 mL
  Filled 2017-06-29: qty 10

## 2017-06-29 MED ORDER — HEPARIN SOD (PORK) LOCK FLUSH 100 UNIT/ML IV SOLN
500.0000 [IU] | Freq: Once | INTRAVENOUS | Status: AC | PRN
  Administered 2017-06-29: 500 [IU]
  Filled 2017-06-29: qty 5

## 2017-06-29 MED ORDER — DEXAMETHASONE SODIUM PHOSPHATE 10 MG/ML IJ SOLN
10.0000 mg | Freq: Once | INTRAMUSCULAR | Status: AC
  Administered 2017-06-29: 10 mg via INTRAVENOUS

## 2017-06-29 MED ORDER — DIPHENHYDRAMINE HCL 25 MG PO CAPS
50.0000 mg | ORAL_CAPSULE | Freq: Once | ORAL | Status: AC
  Administered 2017-06-29: 50 mg via ORAL

## 2017-06-29 MED ORDER — ACETAMINOPHEN 325 MG PO TABS
650.0000 mg | ORAL_TABLET | Freq: Once | ORAL | Status: AC
  Administered 2017-06-29: 650 mg via ORAL

## 2017-06-29 MED ORDER — SODIUM CHLORIDE 0.9 % IV SOLN
560.0000 mg | Freq: Once | INTRAVENOUS | Status: AC
  Administered 2017-06-29: 560 mg via INTRAVENOUS
  Filled 2017-06-29: qty 56

## 2017-06-29 MED ORDER — TRASTUZUMAB CHEMO 150 MG IV SOLR
450.0000 mg | Freq: Once | INTRAVENOUS | Status: AC
  Administered 2017-06-29: 450 mg via INTRAVENOUS
  Filled 2017-06-29: qty 21.43

## 2017-06-29 MED ORDER — DOCETAXEL CHEMO INJECTION 160 MG/16ML
75.0000 mg/m2 | Freq: Once | INTRAVENOUS | Status: AC
  Administered 2017-06-29: 140 mg via INTRAVENOUS
  Filled 2017-06-29: qty 14

## 2017-06-29 MED ORDER — PALONOSETRON HCL INJECTION 0.25 MG/5ML
INTRAVENOUS | Status: AC
  Filled 2017-06-29: qty 5

## 2017-06-29 MED ORDER — SODIUM CHLORIDE 0.9 % IV SOLN
420.0000 mg | Freq: Once | INTRAVENOUS | Status: AC
  Administered 2017-06-29: 420 mg via INTRAVENOUS
  Filled 2017-06-29: qty 14

## 2017-06-29 NOTE — Progress Notes (Signed)
[facility] referral placed and phone call made to hospital liason to assist with scheduling.  They will call patient to set up.

## 2017-06-29 NOTE — Assessment & Plan Note (Signed)
04/14/2017 Left axillary lymph node biopsy: Metastatic carcinoma positive for CK 7, ER positive,GATA-3 equivocal, negative for CK 20,GCDFP, CDX-2, TTF-1; HER-2 positive ratio 2.08 mammogram and ultrasound revealed 3 discrete abnormal left axillary lymph nodes largest measures 1.8 cm Clinical stage: [place] N1MX  Treatment plan: 1. Neoadjuvant TCH Perjeta 6 cycles followed by Herceptin maintenance for 1 year 2. followed by axillary lymph node dissection. Breast surgery will not be performed because there is no primary tumor identifiable on breast MRI 3. Followed by radiation 4. Followed by antiestrogen therapy ------------------------------------------------------------------------------------------------------------------ Current Treatment: Cycle 3 day 1TCHP Labs reviewed  Chemotherapy toxicities: 1. Constipation alternating with profound diarrhea: Patient took Imodium and it is improving 2.severe heartburn: On Protonix. 3.Nausea related to chemotherapy: Zofran or Compazine. 4. Insomnia: Ativan to 1 mg daily. 5. Dehydration:Gave her IV fluids with normal saline 1 L.  Return to clinic in 3 weeks for cycle 4

## 2017-06-29 NOTE — Patient Instructions (Signed)
[facility] Discharge Instructions for Patients Receiving Chemotherapy  Today you received the following chemotherapy agents :  Herceptin, Perjeta, Taxotere, Carboplatin.  To help prevent nausea and vomiting after your treatment, we encourage you to take your nausea medication as prescribed and as instructed.   If you develop nausea and vomiting that is not controlled by your nausea medication, call the clinic.   BELOW ARE SYMPTOMS THAT SHOULD BE REPORTED IMMEDIATELY:  *FEVER GREATER THAN 100.5 F  *CHILLS WITH OR WITHOUT FEVER  NAUSEA AND VOMITING THAT IS NOT CONTROLLED WITH YOUR NAUSEA MEDICATION  *UNUSUAL SHORTNESS OF BREATH  *UNUSUAL BRUISING OR BLEEDING  TENDERNESS IN MOUTH AND THROAT WITH OR WITHOUT PRESENCE OF ULCERS  *URINARY PROBLEMS  *BOWEL PROBLEMS  UNUSUAL RASH Items with * indicate a potential emergency and should be followed up as soon as possible.  Feel free to call the clinic you have any questions or concerns. The clinic phone number is [phone].  Please show the [facility] at check-in to the Emergency Department and triage nurse.

## 2017-06-29 NOTE — Progress Notes (Signed)
Patient Care Team: Deland Pretty, MD as PCP - General (Internal Medicine)  DIAGNOSIS:  Encounter Diagnosis  Name Primary?  . 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[place])     SUMMARY OF ONCOLOGIC HISTORY:   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[place])   04/14/2017 Initial Diagnosis    Left axillary lymph node biopsy: Metastatic carcinoma positive for CK 7, ER positive,GATA-3 equivocal, negative for CK 20,GCDFP, CDX-2, TTF-1; HER-2 positive ratio 2.08; mammogram and ultrasound revealed 3 discrete abnormal left axillary lymph nodes largest measures 1.8 cm      04/23/2017 Breast MRI    Left axillary lymphadenopathy numerous enlarged level I axillary lymph nodes largest 2.1 cm, no other abdominal masses in the left breast itself, no MRI evidence of malignancy in the right breast      04/26/2017 Imaging    CT CAP: Prominent left axillary lymph nodes no distant metastases, sclerotic focus left pedicle of T9 vertebra favored benign, T5 vertebral hemangioma      05/12/2017 -  Neo-Adjuvant Chemotherapy    TCH Perjeta       CHIEF COMPLIANT: Cycle 3 TCH Perjeta  INTERVAL HISTORY: JOURDIN GENS is a 56 year old with above-mentioned history of left breast cancer currently on neoadjuvant chemotherapy with TCH Perjeta. Today is cycle 3 for treatment. She has tolerated cycle 2 extremely well. She was able to control the diarrhea by staying on top of Lomotil and Imodium. She also took antinausea medicine throughout the first 10 days which improved her ability to eat. Her taste seems to come back after 10 days. Over the past 10 days she has been able to eat much better. The bone pain is very well controlled with Claritin.  REVIEW OF SYSTEMS:   Constitutional: Denies fevers, chills or abnormal weight loss Eyes: Denies blurriness of vision Ears, nose, mouth, throat, and face: Denies mucositis or sore  throat Respiratory: Denies cough, dyspnea or wheezes Cardiovascular: Denies palpitation, chest discomfort Gastrointestinal:  Denies nausea, heartburn or change in bowel habits Skin: Denies abnormal skin rashes Lymphatics: Denies new lymphadenopathy or easy bruising Neurological:Denies numbness, tingling or new weaknesses Behavioral/Psych: Mood is stable, no new changes  Extremities: No lower extremity edema All other systems were reviewed with the patient and are negative.  I have reviewed the past medical history, past surgical history, social history and family history with the patient and they are unchanged from previous note.  ALLERGIES:  has No Known Allergies.  MEDICATIONS:  Current Outpatient Prescriptions  Medication Sig Dispense Refill  . ALPRAZolam (XANAX) 0.5 MG tablet Take 0.5 mg by mouth 3 (three) times daily.  0  . calcium carbonate (OS-CAL) 1250 (500 Ca) MG chewable tablet Chew 1 tablet by mouth daily. Calcium 600 mg    . cholecalciferol (VITAMIN D) 1000 units tablet Take 1,000 Units by mouth daily. 2000 iu    . dexamethasone (DECADRON) 4 MG tablet Take 1 tablet (4 mg total) by mouth 2 (two) times daily. Take 1 tab day before chemo and 1 tab day after chemo. 30 tablet 1  . diphenoxylate-atropine (LOMOTIL) 2.5-0.025 MG tablet Take 1 tablet by mouth 4 (four) times daily as needed for diarrhea or loose stools. 30 tablet 0  . lidocaine-prilocaine (EMLA) cream Apply to affected area once 30 g 3  . LORazepam (ATIVAN) 0.5 MG tablet Take 2 tablets (1 mg total) by mouth at bedtime. 60 tablet 0  . ondansetron (ZOFRAN) 8 MG  tablet Take 1 tablet (8 mg total) by mouth 2 (two) times daily as needed for refractory nausea / vomiting. Start on day 3 after chemo. 30 tablet 1  . pantoprazole (PROTONIX) 40 MG tablet Take 1 tablet (40 mg total) by mouth daily. 30 tablet 3  . prochlorperazine (COMPAZINE) 10 MG tablet Take 1 tablet (10 mg total) by mouth every 6 (six) hours as needed (Nausea or  vomiting). 30 tablet 1  . rosuvastatin (CRESTOR) 10 MG tablet   0  . triamterene-hydrochlorothiazide (MAXZIDE-25) 37.5-25 MG tablet take 1/2 to 1 tablet by mouth daily IN THE MORNING  0  . valACYclovir (VALTREX) 1000 MG tablet take 2 tablets by mouth every 12 hours if needed for 10 days  0   No current facility-administered medications for this visit.     PHYSICAL EXAMINATION: ECOG PERFORMANCE STATUS: 1 - Symptomatic but completely ambulatory  Vitals:   06/29/17 0853  BP: 138/69  Pulse: 95  Resp: 20  Temp: 97.7 F (36.5 C)   Filed Weights   06/29/17 0853  Weight: 154 lb 9.6 oz (70.1 kg)    GENERAL:alert, no distress and comfortable SKIN: skin color, texture, turgor are normal, no rashes or significant lesions EYES: normal, Conjunctiva are pink and non-injected, sclera clear OROPHARYNX:no exudate, no erythema and lips, buccal mucosa, and tongue normal  NECK: supple, thyroid normal size, non-tender, without nodularity LYMPH:  no palpable lymphadenopathy in the cervical, axillary or inguinal LUNGS: clear to auscultation and percussion with normal breathing effort HEART: regular rate & rhythm and no murmurs and no lower extremity edema ABDOMEN:abdomen soft, non-tender and normal bowel sounds MUSCULOSKELETAL:no cyanosis of digits and no clubbing  NEURO: alert & oriented x 3 with fluent speech, no focal motor/sensory deficits EXTREMITIES: No lower extremity edema  LABORATORY DATA:  I have reviewed the data as listed   Chemistry      Component Value Date/Time   NA 131 (L) 06/11/2017 1545   K 3.2 (L) 06/11/2017 1545   CL 97 (L) 05/10/2017 1200   CO2 30 (H) 06/11/2017 1545   BUN 9.3 06/11/2017 1545   CREATININE 0.8 06/11/2017 1545      Component Value Date/Time   CALCIUM 10.0 06/11/2017 1545   ALKPHOS 93 06/11/2017 1545   AST 31 06/11/2017 1545   ALT 61 (H) 06/11/2017 1545   BILITOT 0.54 06/11/2017 1545       Lab Results  Component Value Date   WBC 9.6 06/29/2017     HGB 12.6 06/29/2017   HCT 36.3 06/29/2017   MCV 91.0 06/29/2017   PLT 382 06/29/2017   NEUTROABS 6.4 06/29/2017    ASSESSMENT & PLAN:  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 Neoadjuvant TCH Perjeta 6 cycles followed by Herceptin maintenance for 1 year 2. followed by axillary lymph node dissection. Breast surgery will not be performed because there is no primary tumor identifiable on breast MRI 3. Followed by radiation 4. Followed by antiestrogen therapy ------------------------------------------------------------------------------------------------------------------ Current Treatment: Cycle 3 day 1TCHP Labs reviewed  Chemotherapy toxicities: 1. Constipation alternating with profound diarrhea: Patient took Imodium and it is improving 2.severe heartburn: On Protonix. 3.Nausea related to chemotherapy: Zofran or Compazine. 4. Insomnia:  Ativan to 1 mg daily. 5. Dehydration:Gave her IV fluids with normal saline 1 L. we will see if he can arrange this through home health agency 6. Diarrhea due to Perjeta well-controlled with Lomotil and Imodium 7. Bone pain due to Neulasta: Well controlled with Claritin  Return to clinic in 3 weeks for cycle 4  I spent 25 minutes talking to the patient of which more than half was spent in counseling and coordination of care.  No orders of the defined types were placed in this encounter.  The patient has a good understanding of the overall plan. she agrees with it. she will call with any problems that may develop before the next visit here.   Rulon Eisenmenger,  MD 06/29/17

## 2017-06-29 NOTE — Patient Instructions (Signed)

## 2017-07-01 ENCOUNTER — Telehealth: Payer: Self-pay | Admitting: Hematology and Oncology

## 2017-07-01 NOTE — Telephone Encounter (Signed)
Patient called and wanted to cancel her appointment for tomorrow.  She said that she didn't think she needed it but will keep the appointment on Monday for fluids

## 2017-07-01 NOTE — Telephone Encounter (Signed)
Thank you for notifying us

## 2017-07-02 ENCOUNTER — Ambulatory Visit: Payer: BLUE CROSS/BLUE SHIELD

## 2017-07-05 ENCOUNTER — Other Ambulatory Visit: Payer: Self-pay | Admitting: Hematology and Oncology

## 2017-07-05 ENCOUNTER — Ambulatory Visit (HOSPITAL_BASED_OUTPATIENT_CLINIC_OR_DEPARTMENT_OTHER): Payer: BLUE CROSS/BLUE SHIELD

## 2017-07-05 VITALS — BP 134/82 | HR 111 | Temp 98.6°F | Resp 18 | Ht 68.0 in | Wt 151.9 lb

## 2017-07-05 DIAGNOSIS — Z170 Estrogen receptor positive status [ER+]: Principal | ICD-10-CM

## 2017-07-05 DIAGNOSIS — C50612 Malignant neoplasm of axillary tail of left female breast: Secondary | ICD-10-CM

## 2017-07-05 DIAGNOSIS — C801 Malignant (primary) neoplasm, unspecified: Secondary | ICD-10-CM | POA: Diagnosis not present

## 2017-07-05 DIAGNOSIS — C773 Secondary and unspecified malignant neoplasm of axilla and upper limb lymph nodes: Secondary | ICD-10-CM | POA: Diagnosis not present

## 2017-07-05 MED ORDER — SODIUM CHLORIDE 0.9 % IV SOLN
Freq: Once | INTRAVENOUS | Status: AC
  Administered 2017-07-05: 11:00:00 via INTRAVENOUS

## 2017-07-05 MED ORDER — SODIUM CHLORIDE 0.9% FLUSH
10.0000 mL | Freq: Once | INTRAVENOUS | Status: AC
  Administered 2017-07-05: 10 mL via INTRAVENOUS
  Filled 2017-07-05: qty 10

## 2017-07-05 MED ORDER — HEPARIN SOD (PORK) LOCK FLUSH 100 UNIT/ML IV SOLN
500.0000 [IU] | Freq: Once | INTRAVENOUS | Status: AC
  Administered 2017-07-05: 500 [IU] via INTRAVENOUS
  Filled 2017-07-05: qty 5

## 2017-07-05 NOTE — Patient Instructions (Signed)
Nausea, Adult  Nausea is the feeling of an upset stomach or having to vomit. Nausea on its own is not usually a serious concern, but it may be an early sign of a more serious medical problem. As nausea gets worse, it can lead to vomiting. If vomiting develops, or if you are not able to drink enough fluids, you are at risk of becoming dehydrated. Dehydration can make you tired and thirsty, cause you to have a dry mouth, and decrease how often you urinate. Older adults and people with other diseases or a weak immune system are at higher risk for dehydration. The main goals of treating your nausea are:  · To limit repeated nausea episodes.  · To prevent vomiting and dehydration.    Follow these instructions at home:  Follow instructions from your health care provider about how to care for yourself at home.  Eating and drinking   Follow these recommendations as told by your health care provider:  · Take an oral rehydration solution (ORS). This is a drink that is sold at pharmacies and retail stores.  · Drink clear fluids in small amounts as you are able. Clear fluids include water, ice chips, diluted fruit juice, and low-calorie sports drinks.  · Eat bland, easy-to-digest foods in small amounts as you are able. These foods include bananas, applesauce, rice, lean meats, toast, and crackers.  · Avoid drinking fluids that contain a lot of sugar or caffeine, such as energy drinks, sports drinks, and soda.  · Avoid alcohol.  · Avoid spicy or fatty foods.    General instructions   · Drink enough fluid to keep your urine clear or pale yellow.  · Wash your hands often. If soap and water are not available, use hand sanitizer.  · Make sure that all people in your household wash their hands well and often.  · Rest at home while you recover.  · Take over-the-counter and prescription medicines only as told by your health care provider.  · Breathe slowly and deeply when you feel nauseous.  · Watch your condition for any  changes.  · Keep all follow-up visits as told by your health care provider. This is important.  Contact a health care provider if:  · You have a headache.  · You have new symptoms.  · Your nausea gets worse.  · You have a fever.  · You feel light-headed or dizzy.  · You vomit.  · You cannot keep fluids down.  Get help right away if:  · You have pain in your chest, neck, arm, or jaw.  · You feel extremely weak or you faint.  · You have vomit that is bright red or looks like coffee grounds.  · You have bloody or black stools or stools that look like tar.  · You have a severe headache, a stiff neck, or both.  · You have severe pain, cramping, or bloating in your abdomen.  · You have a rash.  · You have difficulty breathing or are breathing very quickly.  · Your heart is beating very quickly.  · Your skin feels cold and clammy.  · You feel confused.  · You have pain when you urinate.  · You have signs of dehydration, such as:  ? Dark urine, very little, or no urine.  ? Cracked lips.  ? Dry mouth.  ? Sunken eyes.  ? Sleepiness.  ? Weakness.  These symptoms may represent a serious problem that is an emergency. Do   not wait to see if the symptoms will go away. Get medical help right away. Call your local emergency services (911 in the [place] Do not drive yourself to the hospital.  This information is not intended to replace advice given to you by your health care provider. Make sure you discuss any questions you have with your health care provider.  Document Released: 12/31/2004 Document Revised: 04/27/2016 Document Reviewed: 07/30/2015  Elsevier Interactive Patient Education © 2017 [employer]

## 2017-07-06 ENCOUNTER — Other Ambulatory Visit: Payer: Self-pay | Admitting: *Deleted

## 2017-07-06 DIAGNOSIS — C50612 Malignant neoplasm of axillary tail of left female breast: Secondary | ICD-10-CM

## 2017-07-06 DIAGNOSIS — Z170 Estrogen receptor positive status [ER+]: Principal | ICD-10-CM

## 2017-07-06 MED ORDER — DIPHENOXYLATE-ATROPINE 2.5-0.025 MG PO TABS: 1.0000 | ORAL_TABLET | Freq: Four times a day (QID) | ORAL | 1 refills | Status: DC | PRN

## 2017-07-06 MED ORDER — LORAZEPAM 0.5 MG PO TABS
ORAL_TABLET | ORAL | 1 refills | Status: DC
Start: 2017-07-06 — End: 2017-09-24

## 2017-07-06 NOTE — Telephone Encounter (Signed)
Received call from patient stating per her pharmacy her ativan was denied and she needs a refill on lomotil.  Called both prescriptions into her pharmacy.  Per Pharmacy the ativan was sent electronically and they couln't accept it.

## 2017-07-20 ENCOUNTER — Other Ambulatory Visit (HOSPITAL_BASED_OUTPATIENT_CLINIC_OR_DEPARTMENT_OTHER): Payer: BLUE CROSS/BLUE SHIELD

## 2017-07-20 ENCOUNTER — Ambulatory Visit (HOSPITAL_BASED_OUTPATIENT_CLINIC_OR_DEPARTMENT_OTHER): Payer: BLUE CROSS/BLUE SHIELD

## 2017-07-20 ENCOUNTER — Telehealth: Payer: Self-pay | Admitting: Hematology and Oncology

## 2017-07-20 ENCOUNTER — Ambulatory Visit: Payer: BLUE CROSS/BLUE SHIELD

## 2017-07-20 VITALS — BP 152/72 | HR 78 | Temp 98.4°F | Resp 17

## 2017-07-20 DIAGNOSIS — C773 Secondary and unspecified malignant neoplasm of axilla and upper limb lymph nodes: Secondary | ICD-10-CM | POA: Diagnosis not present

## 2017-07-20 DIAGNOSIS — Z5112 Encounter for antineoplastic immunotherapy: Secondary | ICD-10-CM

## 2017-07-20 DIAGNOSIS — C801 Malignant (primary) neoplasm, unspecified: Secondary | ICD-10-CM

## 2017-07-20 DIAGNOSIS — Z170 Estrogen receptor positive status [ER+]: Principal | ICD-10-CM

## 2017-07-20 DIAGNOSIS — Z5111 Encounter for antineoplastic chemotherapy: Secondary | ICD-10-CM

## 2017-07-20 DIAGNOSIS — Z95828 Presence of other vascular implants and grafts: Secondary | ICD-10-CM

## 2017-07-20 DIAGNOSIS — C50612 Malignant neoplasm of axillary tail of left female breast: Secondary | ICD-10-CM

## 2017-07-20 LAB — CBC WITH DIFFERENTIAL/PLATELET
BASO%: 0.1 % (ref 0.0–2.0)
BASOS ABS: 0 10*3/uL (ref 0.0–0.1)
EOS%: 0 % (ref 0.0–7.0)
Eosinophils Absolute: 0 10*3/uL (ref 0.0–0.5)
HEMATOCRIT: 32.3 % — AB (ref 34.8–46.6)
HEMOGLOBIN: 10.9 g/dL — AB (ref 11.6–15.9)
LYMPH#: 2.4 10*3/uL (ref 0.9–3.3)
LYMPH%: 27.4 % (ref 14.0–49.7)
MCH: 31.7 pg (ref 25.1–34.0)
MCHC: 33.7 g/dL (ref 31.5–36.0)
MCV: 93.9 fL (ref 79.5–101.0)
MONO#: 0.7 10*3/uL (ref 0.1–0.9)
MONO%: 8.1 % (ref 0.0–14.0)
NEUT#: 5.6 10*3/uL (ref 1.5–6.5)
NEUT%: 64.4 % (ref 38.4–76.8)
Platelets: 244 10*3/uL (ref 145–400)
RBC: 3.44 10*6/uL — ABNORMAL LOW (ref 3.70–5.45)
RDW: 15.5 % — ABNORMAL HIGH (ref 11.2–14.5)
WBC: 8.6 10*3/uL (ref 3.9–10.3)
nRBC: 0 % (ref 0–0)

## 2017-07-20 LAB — COMPREHENSIVE METABOLIC PANEL
ALBUMIN: 3.8 g/dL (ref 3.5–5.0)
ALK PHOS: 66 U/L (ref 40–150)
ALT: 50 U/L (ref 0–55)
AST: 29 U/L (ref 5–34)
Anion Gap: 9 mEq/L (ref 3–11)
BILIRUBIN TOTAL: 0.44 mg/dL (ref 0.20–1.20)
BUN: 13.6 mg/dL (ref 7.0–26.0)
CALCIUM: 10 mg/dL (ref 8.4–10.4)
CO2: 27 mEq/L (ref 22–29)
CREATININE: 0.8 mg/dL (ref 0.6–1.1)
Chloride: 101 mEq/L (ref 98–109)
EGFR: 84 mL/min/{1.73_m2} — ABNORMAL LOW (ref >90)
Glucose: 136 mg/dl (ref 70–140)
POTASSIUM: 3.2 meq/L — AB (ref 3.5–5.1)
Sodium: 138 mEq/L (ref 136–145)
Total Protein: 6.6 g/dL (ref 6.4–8.3)

## 2017-07-20 MED ORDER — DIPHENHYDRAMINE HCL 25 MG PO CAPS
ORAL_CAPSULE | ORAL | Status: AC
  Filled 2017-07-20: qty 2

## 2017-07-20 MED ORDER — SODIUM CHLORIDE 0.9 % IV SOLN
560.0000 mg | Freq: Once | INTRAVENOUS | Status: AC
  Administered 2017-07-20: 560 mg via INTRAVENOUS
  Filled 2017-07-20: qty 56

## 2017-07-20 MED ORDER — DIPHENHYDRAMINE HCL 25 MG PO CAPS
50.0000 mg | ORAL_CAPSULE | Freq: Once | ORAL | Status: AC
  Administered 2017-07-20: 50 mg via ORAL

## 2017-07-20 MED ORDER — SODIUM CHLORIDE 0.9 % IV SOLN
420.0000 mg | Freq: Once | INTRAVENOUS | Status: AC
  Administered 2017-07-20: 420 mg via INTRAVENOUS
  Filled 2017-07-20: qty 14

## 2017-07-20 MED ORDER — DOCETAXEL CHEMO INJECTION 160 MG/16ML
75.0000 mg/m2 | Freq: Once | INTRAVENOUS | Status: AC
  Administered 2017-07-20: 140 mg via INTRAVENOUS
  Filled 2017-07-20: qty 14

## 2017-07-20 MED ORDER — ACETAMINOPHEN 325 MG PO TABS
ORAL_TABLET | ORAL | Status: AC
  Filled 2017-07-20: qty 2

## 2017-07-20 MED ORDER — SODIUM CHLORIDE 0.9% FLUSH
10.0000 mL | INTRAVENOUS | Status: DC | PRN
  Administered 2017-07-20: 10 mL
  Filled 2017-07-20: qty 10

## 2017-07-20 MED ORDER — DEXAMETHASONE SODIUM PHOSPHATE 10 MG/ML IJ SOLN
INTRAMUSCULAR | Status: AC
  Filled 2017-07-20: qty 1

## 2017-07-20 MED ORDER — PALONOSETRON HCL INJECTION 0.25 MG/5ML
0.2500 mg | Freq: Once | INTRAVENOUS | Status: AC
  Administered 2017-07-20: 0.25 mg via INTRAVENOUS

## 2017-07-20 MED ORDER — SODIUM CHLORIDE 0.9 % IV SOLN
Freq: Once | INTRAVENOUS | Status: AC
  Administered 2017-07-20: 09:00:00 via INTRAVENOUS

## 2017-07-20 MED ORDER — PALONOSETRON HCL INJECTION 0.25 MG/5ML
INTRAVENOUS | Status: AC
  Filled 2017-07-20: qty 5

## 2017-07-20 MED ORDER — SODIUM CHLORIDE 0.9 % IV SOLN
450.0000 mg | Freq: Once | INTRAVENOUS | Status: AC
  Administered 2017-07-20: 450 mg via INTRAVENOUS
  Filled 2017-07-20: qty 21.43

## 2017-07-20 MED ORDER — ACETAMINOPHEN 325 MG PO TABS
650.0000 mg | ORAL_TABLET | Freq: Once | ORAL | Status: AC
  Administered 2017-07-20: 650 mg via ORAL

## 2017-07-20 MED ORDER — DEXAMETHASONE SODIUM PHOSPHATE 10 MG/ML IJ SOLN
10.0000 mg | Freq: Once | INTRAMUSCULAR | Status: AC
Start: 2017-07-20 — End: 2017-07-20
  Administered 2017-07-20: 10 mg via INTRAVENOUS

## 2017-07-20 MED ORDER — PEGFILGRASTIM 6 MG/0.6ML ~~LOC~~ PSKT
6.0000 mg | PREFILLED_SYRINGE | Freq: Once | SUBCUTANEOUS | Status: AC
  Administered 2017-07-20: 6 mg via SUBCUTANEOUS
  Filled 2017-07-20: qty 0.6

## 2017-07-20 MED ORDER — HEPARIN SOD (PORK) LOCK FLUSH 100 UNIT/ML IV SOLN
500.0000 [IU] | Freq: Once | INTRAVENOUS | Status: AC | PRN
  Administered 2017-07-20: 500 [IU]
  Filled 2017-07-20: qty 5

## 2017-07-20 MED ORDER — SODIUM CHLORIDE 0.9% FLUSH
10.0000 mL | INTRAVENOUS | Status: DC | PRN
Start: 2017-07-20 — End: 2017-07-20
  Administered 2017-07-20: 10 mL via INTRAVENOUS
  Filled 2017-07-20: qty 10

## 2017-07-20 NOTE — Patient Instructions (Signed)
[facility] Discharge Instructions for Patients Receiving Chemotherapy  Today you received the following chemotherapy agents :  Herceptin, Perjeta, Taxotere, Carboplatin.  To help prevent nausea and vomiting after your treatment, we encourage you to take your nausea medication as prescribed and as instructed.   If you develop nausea and vomiting that is not controlled by your nausea medication, call the clinic.   BELOW ARE SYMPTOMS THAT SHOULD BE REPORTED IMMEDIATELY:  *FEVER GREATER THAN 100.5 F  *CHILLS WITH OR WITHOUT FEVER  NAUSEA AND VOMITING THAT IS NOT CONTROLLED WITH YOUR NAUSEA MEDICATION  *UNUSUAL SHORTNESS OF BREATH  *UNUSUAL BRUISING OR BLEEDING  TENDERNESS IN MOUTH AND THROAT WITH OR WITHOUT PRESENCE OF ULCERS  *URINARY PROBLEMS  *BOWEL PROBLEMS  UNUSUAL RASH Items with * indicate a potential emergency and should be followed up as soon as possible.  Feel free to call the clinic you have any questions or concerns. The clinic phone number is [phone].  Please show the [place] at check-in to the Emergency Department and triage nurse.

## 2017-07-20 NOTE — Telephone Encounter (Signed)
lvm to inform pt of infusion appt 8/20 at 10 am per sch msg

## 2017-07-23 ENCOUNTER — Ambulatory Visit (HOSPITAL_COMMUNITY)
Admission: RE | Admit: 2017-07-23 | Discharge: 2017-07-23 | Disposition: A | Discharge status: Home / Self Care | Payer: BLUE CROSS/BLUE SHIELD | Source: Ambulatory Visit | Attending: Oncology | Admitting: Oncology

## 2017-07-23 ENCOUNTER — Ambulatory Visit (HOSPITAL_BASED_OUTPATIENT_CLINIC_OR_DEPARTMENT_OTHER): Payer: BLUE CROSS/BLUE SHIELD | Admitting: Oncology

## 2017-07-23 ENCOUNTER — Other Ambulatory Visit (HOSPITAL_BASED_OUTPATIENT_CLINIC_OR_DEPARTMENT_OTHER): Payer: BLUE CROSS/BLUE SHIELD

## 2017-07-23 ENCOUNTER — Telehealth: Payer: Self-pay

## 2017-07-23 ENCOUNTER — Other Ambulatory Visit: Payer: Self-pay | Admitting: *Deleted

## 2017-07-23 VITALS — BP 114/65 | HR 114 | Temp 98.5°F | Resp 18 | Ht 68.0 in | Wt 153.9 lb

## 2017-07-23 DIAGNOSIS — R05 Cough: Secondary | ICD-10-CM | POA: Insufficient documentation

## 2017-07-23 DIAGNOSIS — Z170 Estrogen receptor positive status [ER+]: Secondary | ICD-10-CM

## 2017-07-23 DIAGNOSIS — C773 Secondary and unspecified malignant neoplasm of axilla and upper limb lymph nodes: Secondary | ICD-10-CM

## 2017-07-23 DIAGNOSIS — C50612 Malignant neoplasm of axillary tail of left female breast: Secondary | ICD-10-CM | POA: Diagnosis not present

## 2017-07-23 DIAGNOSIS — D72829 Elevated white blood cell count, unspecified: Secondary | ICD-10-CM | POA: Diagnosis not present

## 2017-07-23 DIAGNOSIS — R918 Other nonspecific abnormal finding of lung field: Secondary | ICD-10-CM | POA: Insufficient documentation

## 2017-07-23 DIAGNOSIS — C50912 Malignant neoplasm of unspecified site of left female breast: Secondary | ICD-10-CM | POA: Diagnosis not present

## 2017-07-23 LAB — COMPREHENSIVE METABOLIC PANEL
ALT: 84 U/L — AB (ref 0–55)
ANION GAP: 9 meq/L (ref 3–11)
AST: 67 U/L — ABNORMAL HIGH (ref 5–34)
Albumin: 3.8 g/dL (ref 3.5–5.0)
Alkaline Phosphatase: 87 U/L (ref 40–150)
BILIRUBIN TOTAL: 1.11 mg/dL (ref 0.20–1.20)
BUN: 16.2 mg/dL (ref 7.0–26.0)
CALCIUM: 9.7 mg/dL (ref 8.4–10.4)
CO2: 31 mEq/L — ABNORMAL HIGH (ref 22–29)
CREATININE: 0.8 mg/dL (ref 0.6–1.1)
Chloride: 94 mEq/L — ABNORMAL LOW (ref 98–109)
EGFR: 81 mL/min/{1.73_m2} — ABNORMAL LOW (ref >90)
Glucose: 118 mg/dl (ref 70–140)
Potassium: 3.7 mEq/L (ref 3.5–5.1)
Sodium: 134 mEq/L — ABNORMAL LOW (ref 136–145)
TOTAL PROTEIN: 6.8 g/dL (ref 6.4–8.3)

## 2017-07-23 LAB — CBC WITH DIFFERENTIAL/PLATELET
BASO%: 0.9 % (ref 0.0–2.0)
Basophils Absolute: 0.1 10*3/uL (ref 0.0–0.1)
EOS%: 0.2 % (ref 0.0–7.0)
Eosinophils Absolute: 0 10*3/uL (ref 0.0–0.5)
HCT: 35.4 % (ref 34.8–46.6)
HGB: 11.9 g/dL (ref 11.6–15.9)
LYMPH#: 1.8 10*3/uL (ref 0.9–3.3)
LYMPH%: 15.1 % (ref 14.0–49.7)
MCH: 32.2 pg (ref 25.1–34.0)
MCHC: 33.6 g/dL (ref 31.5–36.0)
MCV: 95.7 fL (ref 79.5–101.0)
MONO#: 0.1 10*3/uL (ref 0.1–0.9)
MONO%: 0.9 % (ref 0.0–14.0)
NEUT%: 82.9 % — ABNORMAL HIGH (ref 38.4–76.8)
NEUTROS ABS: 10.1 10*3/uL — AB (ref 1.5–6.5)
PLATELETS: 281 10*3/uL (ref 145–400)
RBC: 3.7 10*6/uL (ref 3.70–5.45)
RDW: 15.5 % — AB (ref 11.2–14.5)
WBC: 12.2 10*3/uL — ABNORMAL HIGH (ref 3.9–10.3)

## 2017-07-23 MED ORDER — PROMETHAZINE-CODEINE 6.25-10 MG/5ML PO SYRP: 5.0000 mL | ORAL_SOLUTION | Freq: Three times a day (TID) | ORAL | 0 refills | Status: DC | PRN

## 2017-07-23 MED ORDER — BENZONATATE 100 MG PO CAPS: 100.0000 mg | ORAL_CAPSULE | Freq: Three times a day (TID) | ORAL | 0 refills | Status: DC | PRN

## 2017-07-23 NOTE — Telephone Encounter (Signed)
Pt called with c/o bad sore throat and deep phlegmy cough. Yesterday woke up with scratchy throat and progressed. The cough will take her breath away it is so hard. Not much phlegm and swallows it. No fever. No nausea. No body aches, is washed out.  Daughter says throat is red, no white patches.  Drank about 75 oz yesterday, today so far just a boost.  8/14 D1 C4 herceptin perjeta carbo docetaxel  Next ov 8/20 smc infusion for ivf 8/22 echo 9/4 lab flush [facility] inf

## 2017-07-23 NOTE — Progress Notes (Signed)
Melissa [name]   DOB:01-05-1961   YT#:[id number]   IDC#:[id number]  Subjective:  Melissa [name] is currently day for cycle 4 of 6 planned cycles of carboplatin and docetaxel Pertuzumab and trastuzumab for triple positive left-sided breast cancer. She called triage today complaining of severe cough, not accompanied by fever or pleurisy. I asked them to use put her on my schedule with a chest x-ray and CBC and she is here accompanied by her sister to discuss those results.   Objective: Middle-aged white woman in no acute distress Vitals:   07/23/17 1301  BP: 114/65  Pulse: (!) 114  Resp: 18  Temp: 98.5 F (36.9 C)  SpO2: 98%    Body mass index is 23.4 kg/m.    Sclerae unicteric  Oropharynx shows no thrush or other lesions  No cervical or supraclavicular adenopathy  Lungs no rales or wheezes  Heart regular rate and rhythm  Abdomen soft, +BS  Neuro nonfocal   CBG (last 3)  No results for input(s): GLUCAP in the last 72 hours.   Labs:  Lab Results  Component Value Date   WBC 12.2 (H) 07/23/2017   HGB 11.9 07/23/2017   HCT 35.4 07/23/2017   MCV 95.7 07/23/2017   PLT 281 07/23/2017   NEUTROABS 10.1 (H) 07/23/2017      Chemistry      Component Value Date/Time   NA 138 07/20/2017 0837   K 3.2 (L) 07/20/2017 0837   CL 97 (L) 05/10/2017 1200   CO2 27 07/20/2017 0837   BUN 13.6 07/20/2017 0837   CREATININE 0.8 07/20/2017 0837      Component Value Date/Time   CALCIUM 10.0 07/20/2017 0837   ALKPHOS 66 07/20/2017 0837   AST 29 07/20/2017 0837   ALT 50 07/20/2017 0837   BILITOT 0.44 07/20/2017 0837       Urine Studies No results for input(s): UHGB, CRYS in the last 72 hours.  Invalid input(s): UACOL, UAPR, USPG, UPH, UTP, UGL, UKET, UBIL, UNIT, UROB, ULEU, UEPI, UWBC, URBC, UBAC, CAST, UCOM, BILUA  Basic Metabolic Panel:  Recent Labs Lab 07/20/17 0837  NA 138  K 3.2*  CO2 27  GLUCOSE 136  BUN 13.6  CREATININE 0.8  CALCIUM 10.0   GFR Estimated Creatinine  Clearance: 77.3 mL/min (by C-G formula based on SCr of 0.8 mg/dL). Liver Function Tests:  Recent Labs Lab 07/20/17 0837  AST 29  ALT 50  ALKPHOS 66  BILITOT 0.44  PROT 6.6  ALBUMIN 3.8   No results for input(s): LIPASE, AMYLASE in the last 168 hours. No results for input(s): AMMONIA in the last 168 hours. Coagulation profile No results for input(s): INR, PROTIME in the last 168 hours.  CBC:  Recent Labs Lab 07/20/17 0837 07/23/17 1248  WBC 8.6 12.2*  NEUTROABS 5.6 10.1*  HGB 10.9* 11.9  HCT 32.3* 35.4  MCV 93.9 95.7  PLT 244 281   Cardiac Enzymes: No results for input(s): CKTOTAL, CKMB, CKMBINDEX, TROPONINI in the last 168 hours. BNP: Invalid input(s): POCBNP CBG: No results for input(s): GLUCAP in the last 168 hours. D-Dimer No results for input(s): DDIMER in the last 72 hours. Hgb A1c No results for input(s): HGBA1C in the last 72 hours. Lipid Profile No results for input(s): CHOL, HDL, LDLCALC, TRIG, CHOLHDL, LDLDIRECT in the last 72 hours. Thyroid function studies No results for input(s): TSH, T4TOTAL, T3FREE, THYROIDAB in the last 72 hours.  Invalid input(s): FREET3 Anemia work up No results for input(s): VITAMINB12, FOLATE, FERRITIN, TIBC, IRON,  RETICCTPCT in the last 72 hours. Microbiology No results found for this or any previous visit (from the past 240 hour(s)).    Studies:  Dg Chest 2 View  Result Date: 07/23/2017 CLINICAL DATA:  Cough and sore throat EXAM: CHEST  2 VIEW COMPARISON:  05/12/2017 FINDINGS: There is a right chest wall port a catheter with tip in the SVC. Normal heart size. Scar versus platelike atelectasis noted in the left base. No airspace opacities. IMPRESSION: 1. Left base scar versus platelike atelectasis.  No pneumonia. Electronically Signed   By: Kerby Moors M.D.   On: 07/23/2017 12:34    Assessment: 56 y.o. Melissa [name] woman receiving neoadjuvant chemotherapy immunotherapy for node positive, estrogen and HER-2 positive  left-sided breast cancer, presenting with severe cough    Plan:  She has no wheezing by exam. She has a slight elevation in her white cell count but of course she received Neulasta a few days ago. The chest x-ray does not show any evidence of pneumonia. She has no fever and no purulent sputum.  In short I think this is likely a viral or allergic problem. She tells me she did have a friend who accompanied her to chemotherapy and also brought her some food and after doing all that told her that everyone in her house was sick and feeling terrible. That may be the source of the problem  I wrote her for Melissa [name] Ridgel and for Phenergan/codeine syrup. I asked her to call us if her temperature rises above 100.  Otherwise she knows to call for any other issues that may develop and she already has follow-ups in place.   Chauncey Cruel, MD 07/23/2017  1:22 PM Medical Oncology and Hematology [facility] [address] [place], [place] [address] Tel. [phone]    Fax. [phone]

## 2017-07-23 NOTE — Telephone Encounter (Signed)
S/w dr Jana Hakim and called pt back. Arranged for her to get cxr about 1215 and lab at 1300. She will be worked in to see dr Jana Hakim after that. Pt is agreeable.

## 2017-07-26 ENCOUNTER — Ambulatory Visit (HOSPITAL_BASED_OUTPATIENT_CLINIC_OR_DEPARTMENT_OTHER): Payer: BLUE CROSS/BLUE SHIELD

## 2017-07-26 ENCOUNTER — Telehealth: Payer: Self-pay

## 2017-07-26 VITALS — BP 122/70 | HR 92 | Temp 98.2°F | Resp 17 | Ht 68.0 in | Wt 152.0 lb

## 2017-07-26 DIAGNOSIS — Z95828 Presence of other vascular implants and grafts: Secondary | ICD-10-CM

## 2017-07-26 DIAGNOSIS — C773 Secondary and unspecified malignant neoplasm of axilla and upper limb lymph nodes: Secondary | ICD-10-CM | POA: Diagnosis not present

## 2017-07-26 DIAGNOSIS — C50912 Malignant neoplasm of unspecified site of left female breast: Secondary | ICD-10-CM

## 2017-07-26 DIAGNOSIS — C50612 Malignant neoplasm of axillary tail of left female breast: Secondary | ICD-10-CM

## 2017-07-26 DIAGNOSIS — Z170 Estrogen receptor positive status [ER+]: Principal | ICD-10-CM

## 2017-07-26 MED ORDER — SODIUM CHLORIDE 0.9 % IV SOLN
Freq: Once | INTRAVENOUS | Status: AC
  Administered 2017-07-26: 10:00:00 via INTRAVENOUS

## 2017-07-26 MED ORDER — SODIUM CHLORIDE 0.9% FLUSH
10.0000 mL | INTRAVENOUS | Status: DC | PRN
  Administered 2017-07-26: 10 mL via INTRAVENOUS
  Filled 2017-07-26: qty 10

## 2017-07-26 MED ORDER — HEPARIN SOD (PORK) LOCK FLUSH 100 UNIT/ML IV SOLN
500.0000 [IU] | Freq: Once | INTRAVENOUS | Status: AC | PRN
  Administered 2017-07-26: 500 [IU] via INTRAVENOUS
  Filled 2017-07-26: qty 5

## 2017-07-26 NOTE — Telephone Encounter (Signed)
Ash on back of hand, red, not bumpy, burns like a sunburn. It showed up. Chemo on Tuesday, started showing over the weekend. This has shown up with each chemo and goes away. It is getting worse each time. The burning sensation is new this time. From knuckles to wrist, not on back of fingers. She is using aquaphor with no relief.  Instructed about avoiding hot water. She will try OTC hydrocortisone cream. What else does Dr Lindi Adie suggest as far as prescription creams?  Docetaxel, carbo, herceptin, perjeta on 8/14

## 2017-07-26 NOTE — Telephone Encounter (Signed)
Will follow up with patient tomorrow to see how she does with OTC cream. Thank you.

## 2017-07-26 NOTE — Patient Instructions (Signed)
Dehydration, Adult Dehydration is a condition in which there is not enough fluid or water in the body. This happens when you lose more fluids than you take in. Important organs, such as the kidneys, brain, and heart, cannot function without a proper amount of fluids. Any loss of fluids from the body can lead to dehydration. Dehydration can range from mild to severe. This condition should be treated right away to prevent it from becoming severe. What are the causes? This condition may be caused by:  Vomiting.  Diarrhea.  Excessive sweating, such as from heat exposure or exercise.  Not drinking enough fluid, especially: ? When ill. ? While doing activity that requires a lot of energy.  Excessive urination.  Fever.  Infection.  Certain medicines, such as medicines that cause the body to lose excess fluid (diuretics).  Inability to access safe drinking water.  Reduced physical ability to get adequate water and food.  What increases the risk? This condition is more likely to develop in people:  Who have a poorly controlled long-term (chronic) illness, such as diabetes, heart disease, or kidney disease.  Who are age 65 or older.  Who are disabled.  Who live in a place with high altitude.  Who play endurance sports.  What are the signs or symptoms? Symptoms of mild dehydration may include:  Thirst.  Dry lips.  Slightly dry mouth.  Dry, warm skin.  Dizziness. Symptoms of moderate dehydration may include:  Very dry mouth.  Muscle cramps.  Dark urine. Urine may be the color of tea.  Decreased urine production.  Decreased tear production.  Heartbeat that is irregular or faster than normal (palpitations).  Headache.  Light-headedness, especially when you stand up from a sitting position.  Fainting (syncope). Symptoms of severe dehydration may include:  Changes in skin, such as: ? Cold and clammy skin. ? Blotchy (mottled) or pale skin. ? Skin that does  not quickly return to normal after being lightly pinched and released (poor skin turgor).  Changes in body fluids, such as: ? Extreme thirst. ? No tear production. ? Inability to sweat when body temperature is high, such as in hot weather. ? Very little urine production.  Changes in vital signs, such as: ? Weak pulse. ? Pulse that is more than 100 beats a minute when sitting still. ? Rapid breathing. ? Low blood pressure.  Other changes, such as: ? Sunken eyes. ? Cold hands and feet. ? Confusion. ? Lack of energy (lethargy). ? Difficulty waking up from sleep. ? Short-term weight loss. ? Unconsciousness. How is this diagnosed? This condition is diagnosed based on your symptoms and a physical exam. Blood and urine tests may be done to help confirm the diagnosis. How is this treated? Treatment for this condition depends on the severity. Mild or moderate dehydration can often be treated at home. Treatment should be started right away. Do not wait until dehydration becomes severe. Severe dehydration is an emergency and it needs to be treated in a hospital. Treatment for mild dehydration may include:  Drinking more fluids.  Replacing salts and minerals in your blood (electrolytes) that you may have lost. Treatment for moderate dehydration may include:  Drinking an oral rehydration solution (ORS). This is a drink that helps you replace fluids and electrolytes (rehydrate). It can be found at pharmacies and retail stores. Treatment for severe dehydration may include:  Receiving fluids through an IV tube.  Receiving an electrolyte solution through a feeding tube that is passed through your nose   and into your stomach (nasogastric tube, or NG tube).  Correcting any abnormalities in electrolytes.  Treating the underlying cause of dehydration. Follow these instructions at home:  If directed by your health care provider, drink an ORS: ? Make an ORS by following instructions on the  package. ? Start by drinking small amounts, about  cup (120 mL) every 5-10 minutes. ? Slowly increase how much you drink until you have taken the amount recommended by your health care provider.  Drink enough clear fluid to keep your urine clear or pale yellow. If you were told to drink an ORS, finish the ORS first, then start slowly drinking other clear fluids. Drink fluids such as: ? Water. Do not drink only water. Doing that can lead to having too little salt (sodium) in the body (hyponatremia). ? Ice chips. ? Fruit juice that you have added water to (diluted fruit juice). ? Low-calorie sports drinks.  Avoid: ? Alcohol. ? Drinks that contain a lot of sugar. These include high-calorie sports drinks, fruit juice that is not diluted, and soda. ? Caffeine. ? Foods that are greasy or contain a lot of fat or sugar.  Take over-the-counter and prescription medicines only as told by your health care provider.  Do not take sodium tablets. This can lead to having too much sodium in the body (hypernatremia).  Eat foods that contain a healthy balance of electrolytes, such as bananas, oranges, potatoes, tomatoes, and spinach.  Keep all follow-up visits as told by your health care provider. This is important. Contact a health care provider if:  You have abdominal pain that: ? Gets worse. ? Stays in one area (localizes).  You have a rash.  You have a stiff neck.  You are more irritable than usual.  You are sleepier or more difficult to wake up than usual.  You feel weak or dizzy.  You feel very thirsty.  You have urinated only a small amount of very dark urine over 6-8 hours. Get help right away if:  You have symptoms of severe dehydration.  You cannot drink fluids without vomiting.  Your symptoms get worse with treatment.  You have a fever.  You have a severe headache.  You have vomiting or diarrhea that: ? Gets worse. ? Does not go away.  You have blood or green matter  (bile) in your vomit.  You have blood in your stool. This may cause stool to look black and tarry.  You have not urinated in 6-8 hours.  You faint.  Your heart rate while sitting still is over 100 beats a minute.  You have trouble breathing. This information is not intended to replace advice given to you by your health care provider. Make sure you discuss any questions you have with your health care provider. Document Released: 11/23/2005 Document Revised: 06/19/2016 Document Reviewed: 01/17/2016 Elsevier Interactive Patient Education  2018 [employer]  

## 2017-07-27 ENCOUNTER — Other Ambulatory Visit: Payer: Self-pay | Admitting: *Deleted

## 2017-07-27 DIAGNOSIS — Z170 Estrogen receptor positive status [ER+]: Principal | ICD-10-CM

## 2017-07-27 DIAGNOSIS — C50612 Malignant neoplasm of axillary tail of left female breast: Secondary | ICD-10-CM

## 2017-07-28 ENCOUNTER — Ambulatory Visit (HOSPITAL_COMMUNITY)
Admission: RE | Admit: 2017-07-28 | Discharge: 2017-07-28 | Disposition: A | Discharge status: Home / Self Care | Payer: BLUE CROSS/BLUE SHIELD | Source: Ambulatory Visit | Attending: Internal Medicine | Admitting: Internal Medicine

## 2017-07-28 ENCOUNTER — Encounter (HOSPITAL_COMMUNITY): Payer: Self-pay | Admitting: Cardiology

## 2017-07-28 ENCOUNTER — Ambulatory Visit (HOSPITAL_BASED_OUTPATIENT_CLINIC_OR_DEPARTMENT_OTHER)
Admission: RE | Admit: 2017-07-28 | Discharge: 2017-07-28 | Disposition: A | Discharge status: Home / Self Care | Payer: BLUE CROSS/BLUE SHIELD | Source: Ambulatory Visit | Attending: Cardiology | Admitting: Cardiology

## 2017-07-28 VITALS — BP 145/90 | HR 99

## 2017-07-28 DIAGNOSIS — Z79899 Other long term (current) drug therapy: Secondary | ICD-10-CM | POA: Diagnosis not present

## 2017-07-28 DIAGNOSIS — C50612 Malignant neoplasm of axillary tail of left female breast: Secondary | ICD-10-CM

## 2017-07-28 DIAGNOSIS — I10 Essential (primary) hypertension: Secondary | ICD-10-CM | POA: Diagnosis not present

## 2017-07-28 DIAGNOSIS — Z170 Estrogen receptor positive status [ER+]: Secondary | ICD-10-CM | POA: Diagnosis not present

## 2017-07-28 DIAGNOSIS — K219 Gastro-esophageal reflux disease without esophagitis: Secondary | ICD-10-CM | POA: Diagnosis not present

## 2017-07-28 DIAGNOSIS — E785 Hyperlipidemia, unspecified: Secondary | ICD-10-CM | POA: Insufficient documentation

## 2017-07-28 NOTE — Progress Notes (Signed)
  Echocardiogram 2D Echocardiogram has been performed.  Melissa [name] 07/28/2017, 11:47 AM

## 2017-07-28 NOTE — Patient Instructions (Signed)
Echo and Follow up in 3 Months 

## 2017-07-29 ENCOUNTER — Other Ambulatory Visit: Payer: Self-pay

## 2017-07-29 ENCOUNTER — Ambulatory Visit (HOSPITAL_BASED_OUTPATIENT_CLINIC_OR_DEPARTMENT_OTHER): Payer: BLUE CROSS/BLUE SHIELD | Admitting: Hematology and Oncology

## 2017-07-29 ENCOUNTER — Encounter: Payer: Self-pay | Admitting: Hematology and Oncology

## 2017-07-29 ENCOUNTER — Telehealth: Payer: Self-pay | Admitting: *Deleted

## 2017-07-29 DIAGNOSIS — Z170 Estrogen receptor positive status [ER+]: Secondary | ICD-10-CM | POA: Diagnosis not present

## 2017-07-29 DIAGNOSIS — C773 Secondary and unspecified malignant neoplasm of axilla and upper limb lymph nodes: Secondary | ICD-10-CM | POA: Diagnosis not present

## 2017-07-29 DIAGNOSIS — C801 Malignant (primary) neoplasm, unspecified: Secondary | ICD-10-CM | POA: Diagnosis not present

## 2017-07-29 MED ORDER — METHYLPREDNISOLONE 4 MG PO TBPK: ORAL_TABLET | ORAL | 0 refills | Status: DC

## 2017-07-29 NOTE — Telephone Encounter (Signed)
Received call from patient stating she developed a red rash that burns over the weekend.  She has been using hydrocortisone but states it feels like a sunburn.  Dr. Lindi Adie would like her to come in to evaluate. Confirmed appointment for today 07/29/17 at 1145am.

## 2017-07-29 NOTE — Assessment & Plan Note (Signed)
04/14/2017 Left axillary lymph node biopsy: Metastatic carcinoma positive for CK 7, ER positive,GATA-3 equivocal, negative for CK 20,GCDFP, CDX-2, TTF-1; HER-2 positive ratio 2.08 mammogram and ultrasound revealed 3 discrete abnormal left axillary lymph nodes largest measures 1.8 cm Clinical stage: TX N1MX  Treatment plan: 1. Neoadjuvant TCH Perjeta 6 cycles followed by Herceptin maintenance for 1 year 2. followed by axillary lymph node dissection. Breast surgery will not be performed because there is no primary tumor identifiable on breast MRI 3. Followed by radiation 4. Followed by antiestrogen therapy ------------------------------------------------------------------------------------------------------------------ Current Treatment: Completed 4 cycles ofTCHP Rash on the arms: Will prescribe Medrol Dose Pak RTC with next chemo     

## 2017-07-29 NOTE — Progress Notes (Signed)
Patient Care Team: Deland Pretty, MD as PCP - General (Internal Medicine)  DIAGNOSIS:  Encounter Diagnosis  Name Primary?  . 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[place])   04/14/2017 Initial Diagnosis    Left axillary lymph node biopsy: Metastatic carcinoma positive for CK 7, ER positive,GATA-3 equivocal, negative for CK 20,GCDFP, CDX-2, TTF-1; HER-2 positive ratio 2.08; mammogram and ultrasound revealed 3 discrete abnormal left axillary lymph nodes largest measures 1.8 cm      04/23/2017 Breast MRI    Left axillary lymphadenopathy numerous enlarged level I axillary lymph nodes largest 2.1 cm, no other abdominal masses in the left breast itself, no MRI evidence of malignancy in the right breast      04/26/2017 Imaging    CT CAP: Prominent left axillary lymph nodes no distant metastases, sclerotic focus left pedicle of T9 vertebra favored benign, T5 vertebral hemangioma      05/12/2017 -  Neo-Adjuvant Chemotherapy    TCH Perjeta       CHIEF COMPLIANT: Rash on Dorsum of arms  INTERVAL HISTORY: Melissa [name] is a 56 yr old with above history of breast cancer on TCHP. She is here for an urgent visit regarding a rash on the dorsum of the palms as well as couple of lesions on the arms. This started 3-4 days ago and appears to be not improving with corticosteroids or Benadryl. She also complains of burning discomfort on the dorsum.  REVIEW OF SYSTEMS:   Constitutional: Denies fevers, chills or abnormal weight loss Eyes: Denies blurriness of vision Ears, nose, mouth, throat, and face: Denies mucositis or sore throat Respiratory: Denies cough, dyspnea or wheezes Cardiovascular: Denies palpitation, chest discomfort Gastrointestinal:  Denies nausea, heartburn or change in bowel habits Skin: rash on the dorsum of the palms  as well as on bilateral arms Lymphatics: Denies new lymphadenopathy or easy bruising Neurological:Denies numbness, tingling or new weaknesses Behavioral/Psych: Mood is stable, no new changes  Extremities: No lower extremity edema Breast:  denies any pain or lumps or nodules in either breasts All other systems were reviewed with the patient and are negative.  I have reviewed the past medical history, past surgical history, social history and family history with the patient and they are unchanged from previous note.  ALLERGIES:  has No Known Allergies.  MEDICATIONS:  Current Outpatient Prescriptions  Medication Sig Dispense Refill  . ALPRAZolam (XANAX) 0.5 MG tablet Take 0.5 mg by mouth 3 (three) times daily.  0  . benzonatate (TESSALON) 100 MG capsule Take 1 capsule (100 mg total) by mouth 3 (three) times daily as needed for cough. 20 capsule 0  . calcium carbonate (OS-CAL) 1250 (500 Ca) MG chewable tablet Chew 1 tablet by mouth daily. Calcium 600 mg    . cholecalciferol (VITAMIN D) 1000 units tablet Take 1,000 Units by mouth daily. 2000 iu    . dexamethasone (DECADRON) 4 MG tablet Take 1 tablet (4 mg total) by mouth 2 (two) times daily. Take 1 tab day before chemo and 1 tab day after chemo. 30 tablet 1  . diphenoxylate-atropine (LOMOTIL) 2.5-0.025 MG tablet Take 1 tablet by mouth 4 (four) times daily as needed for diarrhea or loose stools. 30 tablet 1  . lidocaine-prilocaine (EMLA) cream Apply to affected area once 30 g 3  . loratadine (CLARITIN) 10 MG tablet Take  10 mg by mouth daily.    Marland Kitchen LORazepam (ATIVAN) 0.5 MG tablet TAKE 2 TABLETS BY MOUTH EVERY NIGHT AT BEDTIME FOR SLEEP 60 tablet 1  . methylPREDNISolone (MEDROL DOSEPAK) 4 MG TBPK tablet As directed 1 tablet 0  . ondansetron (ZOFRAN) 8 MG tablet Take 1 tablet (8 mg total) by mouth 2 (two) times daily as needed for refractory nausea / vomiting. Start on day 3 after chemo. 30 tablet 1  . pantoprazole (PROTONIX) 40 MG tablet Take 40 mg  by mouth daily.    . prochlorperazine (COMPAZINE) 10 MG tablet Take 1 tablet (10 mg total) by mouth every 6 (six) hours as needed (Nausea or vomiting). 30 tablet 1  . promethazine-codeine (PHENERGAN WITH CODEINE) 6.25-10 MG/5ML syrup Take 5 mLs by mouth every 8 (eight) hours as needed for cough. 120 mL 0  . rosuvastatin (CRESTOR) 10 MG tablet   0  . triamterene-hydrochlorothiazide (MAXZIDE-25) 37.5-25 MG tablet Take 1 tablet by mouth daily.    . valACYclovir (VALTREX) 1000 MG tablet take 2 tablets by mouth every 12 hours if needed for 10 days  0   No current facility-administered medications for this visit.     PHYSICAL EXAMINATION: ECOG PERFORMANCE STATUS: 1 - Symptomatic but completely ambulatory  Vitals:   07/29/17 1236  BP: (!) 141/80  Pulse: (!) 103  Resp: 18  Temp: 97.9 F (36.6 C)  SpO2: 100%   Filed Weights   07/29/17 1236  Weight: 151 lb 4.8 oz (68.6 kg)    GENERAL:alert, no distress and comfortable SKIN: skin color, texture, turgor are normal, no rashes or significant lesions EYES: normal, Conjunctiva are pink and non-injected, sclera clear OROPHARYNX:no exudate, no erythema and lips, buccal mucosa, and tongue normal  NECK: supple, thyroid normal size, non-tender, without nodularity LYMPH:  no palpable lymphadenopathy in the cervical, axillary or inguinal LUNGS: clear to auscultation and percussion with normal breathing effort HEART: regular rate & rhythm and no murmurs and no lower extremity edema ABDOMEN:abdomen soft, non-tender and normal bowel sounds MUSCULOSKELETAL:no cyanosis of digits and no clubbing  NEURO: alert & oriented x 3 with fluent speech, no focal motor/sensory deficits EXTREMITIES: red maculopapular and petechial rash on the dorsum of the palms  LABORATORY DATA:  I have reviewed the data as listed   Chemistry      Component Value Date/Time   NA 134 (L) 07/23/2017 1248   K 3.7 07/23/2017 1248   CL 97 (L) 05/10/2017 1200   CO2 31 (H)  07/23/2017 1248   BUN 16.2 07/23/2017 1248   CREATININE 0.8 07/23/2017 1248      Component Value Date/Time   CALCIUM 9.7 07/23/2017 1248   ALKPHOS 87 07/23/2017 1248   AST 67 (H) 07/23/2017 1248   ALT 84 (H) 07/23/2017 1248   BILITOT 1.11 07/23/2017 1248       Lab Results  Component Value Date   WBC 12.2 (H) 07/23/2017   HGB 11.9 07/23/2017   HCT 35.4 07/23/2017   MCV 95.7 07/23/2017   PLT 281 07/23/2017   NEUTROABS 10.1 (H) 07/23/2017    ASSESSMENT & PLAN:  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 Neoadjuvant TCH Perjeta 6 cycles  followed by Herceptin maintenance for 1 year 2. followed by axillary lymph node dissection. Breast surgery will not be performed because there is no primary tumor identifiable on breast MRI 3. Followed by radiation 4. Followed by antiestrogen therapy ------------------------------------------------------------------------------------------------------------------ Current Treatment: Completed 4 cycles ofTCHP Rash on the arms: Will prescribe Medrol Dose Pak RTC with next chemo       I spent 25 minutes talking to the patient of which more than half was spent in counseling and coordination of care.  No orders of the defined types were placed in this encounter.  The patient has a good understanding of the overall plan. she agrees with it. she will call with any problems that may develop before the next visit here.   Rulon Eisenmenger, MD 07/29/17

## 2017-07-30 NOTE — Progress Notes (Signed)
Oncology: Dr. Lindi Adie  56 yo with history of breast cancer, HTN, hyperlipidemia was referred for cardio-oncology evaluation by Dr. Lindi Adie.  Axillary node met found initially in 5/18, ER+/PR+/HER2+.  Breast MRI did not show a primary tumor.  Plan for carboplatin/docetaxol/pertuzumab/trastuzumab x 6 cycles then trastuzumab to complete 1 year.  Will need lymph node dissection after chemo, then radiation.   She is doing well with chemotherapy so far.  No exertional dyspnea or chest pain.    PMH: 1. Breast cancer: Axillary node met found initially in 5/18, ER+/PR+/HER2+.  Breast MRI did not show a primary tumor.  Plan for carboplatin/docetaxol/pertuzumab/trastuzumab x 6 cycles then trastuzumab to complete 1 year.  Will need lymph node dissection after chemo, then radiation.  - Echo (5/18): EF 55-60% - Echo (8/18): EF 60-65%, GLS -18.2%, RV normal size and systolic function.  2. Hyperlipidemia 3. HTN 4. GERD  FH: Mitral valve prolapse.   Social History   Social History  . Marital status: Married    Spouse name: N/A  . Number of children: N/A  . Years of education: N/A   Social History Main Topics  . Smoking status: Never Smoker  . Smokeless tobacco: Never Used  . Alcohol use Yes     Comment: socially, wine during the week  . Drug use: No  . Sexual activity: Not Asked   Other Topics Concern  . None   Social History Narrative  . None   ROS: All systems reviewed and negative except as per HPI.   Current Outpatient Prescriptions  Medication Sig Dispense Refill  . ALPRAZolam (XANAX) 0.5 MG tablet Take 0.5 mg by mouth 3 (three) times daily.  0  . benzonatate (TESSALON) 100 MG capsule Take 1 capsule (100 mg total) by mouth 3 (three) times daily as needed for cough. 20 capsule 0  . calcium carbonate (OS-CAL) 1250 (500 Ca) MG chewable tablet Chew 1 tablet by mouth daily. Calcium 600 mg    . cholecalciferol (VITAMIN D) 1000 units tablet Take 1,000 Units by mouth daily. 2000 iu    .  dexamethasone (DECADRON) 4 MG tablet Take 1 tablet (4 mg total) by mouth 2 (two) times daily. Take 1 tab day before chemo and 1 tab day after chemo. 30 tablet 1  . diphenoxylate-atropine (LOMOTIL) 2.5-0.025 MG tablet Take 1 tablet by mouth 4 (four) times daily as needed for diarrhea or loose stools. 30 tablet 1  . lidocaine-prilocaine (EMLA) cream Apply to affected area once 30 g 3  . loratadine (CLARITIN) 10 MG tablet Take 10 mg by mouth daily.    Marland Kitchen LORazepam (ATIVAN) 0.5 MG tablet TAKE 2 TABLETS BY MOUTH EVERY NIGHT AT BEDTIME FOR SLEEP 60 tablet 1  . ondansetron (ZOFRAN) 8 MG tablet Take 1 tablet (8 mg total) by mouth 2 (two) times daily as needed for refractory nausea / vomiting. Start on day 3 after chemo. 30 tablet 1  . pantoprazole (PROTONIX) 40 MG tablet Take 40 mg by mouth daily.    . prochlorperazine (COMPAZINE) 10 MG tablet Take 1 tablet (10 mg total) by mouth every 6 (six) hours as needed (Nausea or vomiting). 30 tablet 1  . promethazine-codeine (PHENERGAN WITH CODEINE) 6.25-10 MG/5ML syrup Take 5 mLs by mouth every 8 (eight) hours as needed for cough. 120 mL 0  . rosuvastatin (CRESTOR) 10 MG tablet   0  . triamterene-hydrochlorothiazide (MAXZIDE-25) 37.5-25 MG tablet Take 1 tablet by mouth daily.    . valACYclovir (VALTREX) 1000 MG tablet take 2  tablets by mouth every 12 hours if needed for 10 days  0  . methylPREDNISolone (MEDROL DOSEPAK) 4 MG TBPK tablet As directed 1 tablet 0   No current facility-administered medications for this encounter.    BP (!) 145/90   Pulse 99   SpO2 100%  General: NAD Neck: No JVD, no thyromegaly or thyroid nodule.  Lungs: Clear to auscultation bilaterally with normal respiratory effort. CV: Nondisplaced PMI.  Heart regular S1/S2, no S3/S4, no murmur.  No peripheral edema.  No carotid bruit.  Normal pedal pulses.  Abdomen: Soft, nontender, no hepatosplenomegaly, no distention.  Skin: Intact without lesions or rashes.  Neurologic: Alert and oriented  x 3.  Psych: Normal affect. Extremities: No clubbing or cyanosis.  HEENT: Normal.   1. Breast cancer: I reviewed with her the rationale behind echo screening with Herceptin therapy.  I reviewed today's echo and the 5/18 echo.  EF and strain parameters are stable.  - She will continue Herceptin, return for office visit and echo in 3 months.  2. HTN: BP mildly elevated today, she is on triamterene/HCTZ. Follow closely.   [name] 07/30/2017

## 2017-08-10 ENCOUNTER — Encounter (INDEPENDENT_AMBULATORY_CARE_PROVIDER_SITE_OTHER): Payer: Self-pay

## 2017-08-10 ENCOUNTER — Other Ambulatory Visit (HOSPITAL_BASED_OUTPATIENT_CLINIC_OR_DEPARTMENT_OTHER): Payer: BLUE CROSS/BLUE SHIELD

## 2017-08-10 ENCOUNTER — Ambulatory Visit (HOSPITAL_BASED_OUTPATIENT_CLINIC_OR_DEPARTMENT_OTHER): Payer: BLUE CROSS/BLUE SHIELD | Admitting: Hematology and Oncology

## 2017-08-10 ENCOUNTER — Encounter: Payer: Self-pay | Admitting: Hematology and Oncology

## 2017-08-10 ENCOUNTER — Ambulatory Visit: Payer: BLUE CROSS/BLUE SHIELD

## 2017-08-10 ENCOUNTER — Ambulatory Visit (HOSPITAL_BASED_OUTPATIENT_CLINIC_OR_DEPARTMENT_OTHER): Payer: BLUE CROSS/BLUE SHIELD

## 2017-08-10 ENCOUNTER — Encounter: Payer: Self-pay | Admitting: *Deleted

## 2017-08-10 DIAGNOSIS — Z5111 Encounter for antineoplastic chemotherapy: Secondary | ICD-10-CM | POA: Diagnosis not present

## 2017-08-10 DIAGNOSIS — R197 Diarrhea, unspecified: Secondary | ICD-10-CM | POA: Diagnosis not present

## 2017-08-10 DIAGNOSIS — K5903 Drug induced constipation: Secondary | ICD-10-CM

## 2017-08-10 DIAGNOSIS — M898X9 Other specified disorders of bone, unspecified site: Secondary | ICD-10-CM | POA: Diagnosis not present

## 2017-08-10 DIAGNOSIS — Z5112 Encounter for antineoplastic immunotherapy: Secondary | ICD-10-CM | POA: Diagnosis not present

## 2017-08-10 DIAGNOSIS — C773 Secondary and unspecified malignant neoplasm of axilla and upper limb lymph nodes: Secondary | ICD-10-CM

## 2017-08-10 DIAGNOSIS — C801 Malignant (primary) neoplasm, unspecified: Secondary | ICD-10-CM | POA: Diagnosis not present

## 2017-08-10 DIAGNOSIS — E860 Dehydration: Secondary | ICD-10-CM | POA: Diagnosis not present

## 2017-08-10 DIAGNOSIS — Z170 Estrogen receptor positive status [ER+]: Principal | ICD-10-CM

## 2017-08-10 DIAGNOSIS — G4700 Insomnia, unspecified: Secondary | ICD-10-CM | POA: Diagnosis not present

## 2017-08-10 DIAGNOSIS — R110 Nausea: Secondary | ICD-10-CM | POA: Diagnosis not present

## 2017-08-10 DIAGNOSIS — C50612 Malignant neoplasm of axillary tail of left female breast: Secondary | ICD-10-CM

## 2017-08-10 DIAGNOSIS — Z95828 Presence of other vascular implants and grafts: Secondary | ICD-10-CM

## 2017-08-10 DIAGNOSIS — R21 Rash and other nonspecific skin eruption: Secondary | ICD-10-CM

## 2017-08-10 LAB — CBC WITH DIFFERENTIAL/PLATELET
BASO%: 0.1 % (ref 0.0–2.0)
BASOS ABS: 0 10*3/uL (ref 0.0–0.1)
EOS%: 0 % (ref 0.0–7.0)
Eosinophils Absolute: 0 10*3/uL (ref 0.0–0.5)
HCT: 33.9 % — ABNORMAL LOW (ref 34.8–46.6)
HEMOGLOBIN: 11.7 g/dL (ref 11.6–15.9)
LYMPH%: 11.7 % — ABNORMAL LOW (ref 14.0–49.7)
MCH: 33.5 pg (ref 25.1–34.0)
MCHC: 34.5 g/dL (ref 31.5–36.0)
MCV: 96.9 fL (ref 79.5–101.0)
MONO#: 0.1 10*3/uL (ref 0.1–0.9)
MONO%: 1.2 % (ref 0.0–14.0)
NEUT%: 87 % — ABNORMAL HIGH (ref 38.4–76.8)
NEUTROS ABS: 4.9 10*3/uL (ref 1.5–6.5)
Platelets: 152 10*3/uL (ref 145–400)
RBC: 3.5 10*6/uL — ABNORMAL LOW (ref 3.70–5.45)
RDW: 16.2 % — AB (ref 11.2–14.5)
WBC: 5.7 10*3/uL (ref 3.9–10.3)
lymph#: 0.7 10*3/uL — ABNORMAL LOW (ref 0.9–3.3)

## 2017-08-10 LAB — COMPREHENSIVE METABOLIC PANEL
ALBUMIN: 3.7 g/dL (ref 3.5–5.0)
ALK PHOS: 61 U/L (ref 40–150)
ALT: 34 U/L (ref 0–55)
AST: 23 U/L (ref 5–34)
Anion Gap: 11 mEq/L (ref 3–11)
BUN: 9.1 mg/dL (ref 7.0–26.0)
CO2: 27 meq/L (ref 22–29)
Calcium: 9.8 mg/dL (ref 8.4–10.4)
Chloride: 98 mEq/L (ref 98–109)
Creatinine: 0.8 mg/dL (ref 0.6–1.1)
EGFR: 81 mL/min/{1.73_m2} — ABNORMAL LOW (ref >90)
Glucose: 204 mg/dl — ABNORMAL HIGH (ref 70–140)
POTASSIUM: 3.4 meq/L — AB (ref 3.5–5.1)
SODIUM: 136 meq/L (ref 136–145)
Total Bilirubin: 0.47 mg/dL (ref 0.20–1.20)
Total Protein: 6.7 g/dL (ref 6.4–8.3)

## 2017-08-10 MED ORDER — SODIUM CHLORIDE 0.9% FLUSH
10.0000 mL | INTRAVENOUS | Status: DC | PRN
  Administered 2017-08-10: 10 mL via INTRAVENOUS
  Filled 2017-08-10: qty 10

## 2017-08-10 MED ORDER — SODIUM CHLORIDE 0.9 % IV SOLN
560.0000 mg | Freq: Once | INTRAVENOUS | Status: AC
  Administered 2017-08-10: 560 mg via INTRAVENOUS
  Filled 2017-08-10: qty 56

## 2017-08-10 MED ORDER — HEPARIN SOD (PORK) LOCK FLUSH 100 UNIT/ML IV SOLN
500.0000 [IU] | Freq: Once | INTRAVENOUS | Status: AC | PRN
  Administered 2017-08-10: 500 [IU]
  Filled 2017-08-10: qty 5

## 2017-08-10 MED ORDER — DIPHENHYDRAMINE HCL 25 MG PO CAPS
ORAL_CAPSULE | ORAL | Status: AC
  Filled 2017-08-10: qty 2

## 2017-08-10 MED ORDER — PALONOSETRON HCL INJECTION 0.25 MG/5ML
0.2500 mg | Freq: Once | INTRAVENOUS | Status: AC
  Administered 2017-08-10: 0.25 mg via INTRAVENOUS

## 2017-08-10 MED ORDER — DIPHENHYDRAMINE HCL 25 MG PO CAPS
50.0000 mg | ORAL_CAPSULE | Freq: Once | ORAL | Status: AC
  Administered 2017-08-10: 50 mg via ORAL

## 2017-08-10 MED ORDER — SODIUM CHLORIDE 0.9 % IV SOLN
Freq: Once | INTRAVENOUS | Status: AC
  Administered 2017-08-10: 11:00:00 via INTRAVENOUS

## 2017-08-10 MED ORDER — DEXAMETHASONE SODIUM PHOSPHATE 10 MG/ML IJ SOLN
INTRAMUSCULAR | Status: AC
  Filled 2017-08-10: qty 1

## 2017-08-10 MED ORDER — SODIUM CHLORIDE 0.9 % IV SOLN
50.0000 mg/m2 | Freq: Once | INTRAVENOUS | Status: AC
  Administered 2017-08-10: 90 mg via INTRAVENOUS
  Filled 2017-08-10: qty 9

## 2017-08-10 MED ORDER — TRASTUZUMAB CHEMO 150 MG IV SOLR
450.0000 mg | Freq: Once | INTRAVENOUS | Status: AC
  Administered 2017-08-10: 450 mg via INTRAVENOUS
  Filled 2017-08-10: qty 21.43

## 2017-08-10 MED ORDER — SODIUM CHLORIDE 0.9 % IV SOLN
420.0000 mg | Freq: Once | INTRAVENOUS | Status: AC
  Administered 2017-08-10: 420 mg via INTRAVENOUS
  Filled 2017-08-10: qty 14

## 2017-08-10 MED ORDER — SODIUM CHLORIDE 0.9% FLUSH
10.0000 mL | INTRAVENOUS | Status: DC | PRN
  Administered 2017-08-10: 10 mL
  Filled 2017-08-10: qty 10

## 2017-08-10 MED ORDER — ACETAMINOPHEN 325 MG PO TABS
ORAL_TABLET | ORAL | Status: AC
  Filled 2017-08-10: qty 2

## 2017-08-10 MED ORDER — DEXAMETHASONE SODIUM PHOSPHATE 10 MG/ML IJ SOLN
10.0000 mg | Freq: Once | INTRAMUSCULAR | Status: AC
  Administered 2017-08-10: 10 mg via INTRAVENOUS

## 2017-08-10 MED ORDER — ACETAMINOPHEN 325 MG PO TABS
650.0000 mg | ORAL_TABLET | Freq: Once | ORAL | Status: AC
  Administered 2017-08-10: 650 mg via ORAL

## 2017-08-10 MED ORDER — PALONOSETRON HCL INJECTION 0.25 MG/5ML
INTRAVENOUS | Status: AC
  Filled 2017-08-10: qty 5

## 2017-08-10 MED ORDER — PEGFILGRASTIM 6 MG/0.6ML ~~LOC~~ PSKT
6.0000 mg | PREFILLED_SYRINGE | Freq: Once | SUBCUTANEOUS | Status: AC
  Administered 2017-08-10: 6 mg via SUBCUTANEOUS
  Filled 2017-08-10: qty 0.6

## 2017-08-10 NOTE — Patient Instructions (Signed)
[facility] Discharge Instructions for Patients Receiving Chemotherapy  Today you received the following chemotherapy agents taxotere, carboplatin To help prevent nausea and vomiting after your treatment, we encourage you to take your nausea medication as prescribed.  If you develop nausea and vomiting that is not controlled by your nausea medication, call the clinic.   BELOW ARE SYMPTOMS THAT SHOULD BE REPORTED IMMEDIATELY:  *FEVER GREATER THAN 100.5 F  *CHILLS WITH OR WITHOUT FEVER  NAUSEA AND VOMITING THAT IS NOT CONTROLLED WITH YOUR NAUSEA MEDICATION  *UNUSUAL SHORTNESS OF BREATH  *UNUSUAL BRUISING OR BLEEDING  TENDERNESS IN MOUTH AND THROAT WITH OR WITHOUT PRESENCE OF ULCERS  *URINARY PROBLEMS  *BOWEL PROBLEMS  UNUSUAL RASH Items with * indicate a potential emergency and should be followed up as soon as possible.  Feel free to call the clinic you have any questions or concerns. The clinic phone number is [phone].  Please show the [facility] at check-in to the Emergency Department and triage nurse.

## 2017-08-10 NOTE — Assessment & Plan Note (Signed)
04/14/2017 Left axillary lymph node biopsy: Metastatic carcinoma positive for CK 7, ER positive,GATA-3 equivocal, negative for CK 20,GCDFP, CDX-2, TTF-1; HER-2 positive ratio 2.08 mammogram and ultrasound revealed 3 discrete abnormal left axillary lymph nodes largest measures 1.8 cm Clinical stage: TX N1MX  Treatment plan: 1. Neoadjuvant TCH Perjeta 6 cycles followed by Herceptin maintenance for 1 year 2. followed by axillary lymph node dissection. Breast surgery will not be performed because there is no primary tumor identifiable on breast MRI 3. Followed by radiation 4. Followed by antiestrogen therapy ------------------------------------------------------------------------------------------------------------------ Current Treatment:  today is cycle 5 ofTCHP  Chemotherapy toxicities: 1. Constipation alternating with profound diarrhea: Patient took Imodium and it is improving 2.severe heartburn: OnProtonix. 3.Nausea related to chemotherapy:Zofran or Compazine. 4. Insomnia: Ativan to 1 mg daily. 5. Dehydration:Gaveher IV fluids with normal saline 1 L. we will see if he can arrange this through home health agency 6. Diarrhea due to Perjeta well-controlled with Lomotil and Imodium 7. Bone pain due to Neulasta: Well controlled with Claritin 8. Rash on the arms: Prescribed Medrol Dose Pak  RTC with next chemo

## 2017-08-10 NOTE — Progress Notes (Signed)
Patient Care Team: Deland Pretty, MD as PCP - General (Internal Medicine)  DIAGNOSIS:  Encounter Diagnosis  Name Primary?  . 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[place])   04/14/2017 Initial Diagnosis    Left axillary lymph node biopsy: Metastatic carcinoma positive for CK 7, ER positive,GATA-3 equivocal, negative for CK 20,GCDFP, CDX-2, TTF-1; HER-2 positive ratio 2.08; mammogram and ultrasound revealed 3 discrete abnormal left axillary lymph nodes largest measures 1.8 cm      04/23/2017 Breast MRI    Left axillary lymphadenopathy numerous enlarged level I axillary lymph nodes largest 2.1 cm, no other abdominal masses in the left breast itself, no MRI evidence of malignancy in the right breast      04/26/2017 Imaging    CT CAP: Prominent left axillary lymph nodes no distant metastases, sclerotic focus left pedicle of T9 vertebra favored benign, T5 vertebral hemangioma      05/12/2017 -  Neo-Adjuvant Chemotherapy    TCH Perjeta       CHIEF COMPLIANT: Cycle 5 TCH Perjeta  INTERVAL HISTORY: Melissa [name] is a 56 year old with above-mentioned history of left breast cancer currently on neoadjuvant chemotherapy today is cycle 5 TCH Perjeta. After the last cycle patient all of the rash on the dorsum of her hands accompanied by burning sensation. We treated her with steroids and the burning sensation in the rash improved significantly. She no longer has any burning sensation but the skin appears to be dry. She has not had any further problems with diarrhea.  REVIEW OF SYSTEMS:   Constitutional: Denies fevers, chills or abnormal weight loss Eyes: Denies blurriness of vision Ears, nose, mouth, throat, and face: Denies mucositis or sore throat Respiratory: Denies cough, dyspnea or wheezes Cardiovascular: Denies palpitation,  chest discomfort Gastrointestinal:  Denies nausea, heartburn or change in bowel habits Skin: Skin rash on the dorsum of the hands has resolved Lymphatics: Denies new lymphadenopathy or easy bruising Neurological:Denies numbness, tingling or new weaknesses Behavioral/Psych: Mood is stable, no new changes  Extremities: No lower extremity edema  All other systems were reviewed with the patient and are negative.  I have reviewed the past medical history, past surgical history, social history and family history with the patient and they are unchanged from previous note.  ALLERGIES:  has No Known Allergies.  MEDICATIONS:  Current Outpatient Prescriptions  Medication Sig Dispense Refill  . ALPRAZolam (XANAX) 0.5 MG tablet Take 0.5 mg by mouth 3 (three) times daily.  0  . benzonatate (TESSALON) 100 MG capsule Take 1 capsule (100 mg total) by mouth 3 (three) times daily as needed for cough. 20 capsule 0  . calcium carbonate (OS-CAL) 1250 (500 Ca) MG chewable tablet Chew 1 tablet by mouth daily. Calcium 600 mg    . cholecalciferol (VITAMIN D) 1000 units tablet Take 1,000 Units by mouth daily. 2000 iu    . dexamethasone (DECADRON) 4 MG tablet Take 1 tablet (4 mg total) by mouth 2 (two) times daily. Take 1 tab day before chemo and 1 tab day after chemo. 30 tablet 1  . diphenoxylate-atropine (LOMOTIL) 2.5-0.025 MG tablet Take 1 tablet by mouth 4 (four) times daily as needed for diarrhea or loose stools. 30 tablet 1  . lidocaine-prilocaine (EMLA) cream Apply to affected area once 30 g 3  . loratadine (CLARITIN) 10 MG tablet Take 10  mg by mouth daily.    Marland Kitchen LORazepam (ATIVAN) 0.5 MG tablet TAKE 2 TABLETS BY MOUTH EVERY NIGHT AT BEDTIME FOR SLEEP 60 tablet 1  . methylPREDNISolone (MEDROL DOSEPAK) 4 MG TBPK tablet As directed 1 tablet 0  . ondansetron (ZOFRAN) 8 MG tablet Take 1 tablet (8 mg total) by mouth 2 (two) times daily as needed for refractory nausea / vomiting. Start on day 3 after chemo. 30 tablet  1  . pantoprazole (PROTONIX) 40 MG tablet Take 40 mg by mouth daily.    . prochlorperazine (COMPAZINE) 10 MG tablet Take 1 tablet (10 mg total) by mouth every 6 (six) hours as needed (Nausea or vomiting). 30 tablet 1  . promethazine-codeine (PHENERGAN WITH CODEINE) 6.25-10 MG/5ML syrup Take 5 mLs by mouth every 8 (eight) hours as needed for cough. 120 mL 0  . rosuvastatin (CRESTOR) 10 MG tablet   0  . triamterene-hydrochlorothiazide (MAXZIDE-25) 37.5-25 MG tablet Take 1 tablet by mouth daily.    . valACYclovir (VALTREX) 1000 MG tablet take 2 tablets by mouth every 12 hours if needed for 10 days  0   No current facility-administered medications for this visit.     PHYSICAL EXAMINATION: ECOG PERFORMANCE STATUS: 1 - Symptomatic but completely ambulatory  Vitals:   08/10/17 0937  BP: (!) 149/114  Pulse: (!) 106  Resp: 18  Temp: 98 F (36.7 C)  SpO2: 100%   Filed Weights   08/10/17 0937  Weight: 151 lb 9.6 oz (68.8 kg)    GENERAL:alert, no distress and comfortable SKIN: skin color, texture, turgor are normal, no rashes or significant lesions EYES: normal, Conjunctiva are pink and non-injected, sclera clear OROPHARYNX:no exudate, no erythema and lips, buccal mucosa, and tongue normal  NECK: supple, thyroid normal size, non-tender, without nodularity LYMPH:  no palpable lymphadenopathy in the cervical, axillary or inguinal LUNGS: clear to auscultation and percussion with normal breathing effort HEART: regular rate & rhythm and no murmurs and no lower extremity edema ABDOMEN:abdomen soft, non-tender and normal bowel sounds MUSCULOSKELETAL:no cyanosis of digits and no clubbing  NEURO: alert & oriented x 3 with fluent speech, no focal motor/sensory deficits EXTREMITIES: No lower extremity edem  LABORATORY DATA:  I have reviewed the data as listed   Chemistry      Component Value Date/Time   NA 136 08/10/2017 0840   K 3.4 (L) 08/10/2017 0840   CL 97 (L) 05/10/2017 1200   CO2 27  08/10/2017 0840   BUN 9.1 08/10/2017 0840   CREATININE 0.8 08/10/2017 0840      Component Value Date/Time   CALCIUM 9.8 08/10/2017 0840   ALKPHOS 61 08/10/2017 0840   AST 23 08/10/2017 0840   ALT 34 08/10/2017 0840   BILITOT 0.47 08/10/2017 0840       Lab Results  Component Value Date   WBC 5.7 08/10/2017   HGB 11.7 08/10/2017   HCT 33.9 (L) 08/10/2017   MCV 96.9 08/10/2017   PLT 152 08/10/2017   NEUTROABS 4.9 08/10/2017    ASSESSMENT & PLAN:  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 Neoadjuvant TCH Perjeta 6 cycles followed by Herceptin maintenance for 1 year 2. followed by axillary lymph  node dissection. Breast surgery will not be performed because there is no primary tumor identifiable on breast MRI 3. Followed by radiation 4. Followed by antiestrogen therapy ------------------------------------------------------------------------------------------------------------------ Current Treatment:  today is cycle 5 ofTCHP  Chemotherapy toxicities: 1. Constipation alternating with profound diarrhea: Patient took Imodium and it is improving 2.severe heartburn: OnProtonix. 3.Nausea related to chemotherapy:Zofran or Compazine. 4. Insomnia: Ativan to 1 mg daily. 5. Dehydration: IV fluids to be given next Monday 6. Diarrhea due to Perjeta well-controlled with Lomotil and Imodium 7. Bone pain due to Neulasta: Well controlled with Claritin 8. Rash on the arms: Prescribed Medrol Dose Pak  RTC with next chemo I will set her up for breast MRI and presentation tumor board  I spent 25 minutes talking to the  patient of which more than half was spent in counseling and coordination of care.  No orders of the defined types were placed in this encounter.  The patient has a good understanding of the overall plan. she agrees with it. she will call with any problems that may develop before the next visit here.   Rulon Eisenmenger, MD 08/10/17

## 2017-08-15 ENCOUNTER — Emergency Department (HOSPITAL_COMMUNITY)
Admission: EM | Admit: 2017-08-15 | Discharge: 2017-08-15 | Disposition: A | Discharge status: Home / Self Care | Payer: BLUE CROSS/BLUE SHIELD | Attending: Emergency Medicine | Admitting: Emergency Medicine

## 2017-08-15 ENCOUNTER — Encounter (HOSPITAL_COMMUNITY): Payer: Self-pay | Admitting: *Deleted

## 2017-08-15 DIAGNOSIS — Z79899 Other long term (current) drug therapy: Secondary | ICD-10-CM | POA: Diagnosis not present

## 2017-08-15 DIAGNOSIS — Z170 Estrogen receptor positive status [ER+]: Secondary | ICD-10-CM | POA: Diagnosis not present

## 2017-08-15 DIAGNOSIS — E860 Dehydration: Secondary | ICD-10-CM

## 2017-08-15 DIAGNOSIS — Z95828 Presence of other vascular implants and grafts: Secondary | ICD-10-CM | POA: Insufficient documentation

## 2017-08-15 DIAGNOSIS — C773 Secondary and unspecified malignant neoplasm of axilla and upper limb lymph nodes: Secondary | ICD-10-CM | POA: Diagnosis not present

## 2017-08-15 DIAGNOSIS — C50612 Malignant neoplasm of axillary tail of left female breast: Secondary | ICD-10-CM | POA: Insufficient documentation

## 2017-08-15 DIAGNOSIS — E876 Hypokalemia: Secondary | ICD-10-CM | POA: Diagnosis not present

## 2017-08-15 LAB — CBC WITH DIFFERENTIAL/PLATELET
Basophils Absolute: 0 10*3/uL (ref 0.0–0.1)
Basophils Relative: 0 %
EOS ABS: 0 10*3/uL (ref 0.0–0.7)
EOS PCT: 0 %
HCT: 32.1 % — ABNORMAL LOW (ref 36.0–46.0)
HEMOGLOBIN: 11.4 g/dL — AB (ref 12.0–15.0)
LYMPHS ABS: 1.2 10*3/uL (ref 0.7–4.0)
Lymphocytes Relative: 38 %
MCH: 33.5 pg (ref 26.0–34.0)
MCHC: 35.5 g/dL (ref 30.0–36.0)
MCV: 94.4 fL (ref 78.0–100.0)
MONOS PCT: 2 %
Monocytes Absolute: 0.1 10*3/uL (ref 0.1–1.0)
Neutro Abs: 1.9 10*3/uL (ref 1.7–7.7)
Neutrophils Relative %: 60 %
PLATELETS: 185 10*3/uL (ref 150–400)
RBC: 3.4 MIL/uL — ABNORMAL LOW (ref 3.87–5.11)
RDW: 14.8 % (ref 11.5–15.5)
WBC: 3.1 10*3/uL — ABNORMAL LOW (ref 4.0–10.5)

## 2017-08-15 LAB — BASIC METABOLIC PANEL
Anion gap: 11 (ref 5–15)
BUN: 16 mg/dL (ref 6–20)
CHLORIDE: 91 mmol/L — AB (ref 101–111)
CO2: 30 mmol/L (ref 22–32)
CREATININE: 0.7 mg/dL (ref 0.44–1.00)
Calcium: 9.3 mg/dL (ref 8.9–10.3)
GFR calc Af Amer: >60 mL/min (ref >60)
GFR calc non Af Amer: >60 mL/min (ref >60)
GLUCOSE: 133 mg/dL — AB (ref 65–99)
Potassium: 2.7 mmol/L — CL (ref 3.5–5.1)
Sodium: 132 mmol/L — ABNORMAL LOW (ref 135–145)

## 2017-08-15 MED ORDER — HEPARIN SOD (PORK) LOCK FLUSH 100 UNIT/ML IV SOLN
500.0000 [IU] | Freq: Once | INTRAVENOUS | Status: AC
  Administered 2017-08-15: 500 [IU]
  Filled 2017-08-15: qty 5

## 2017-08-15 MED ORDER — POTASSIUM CHLORIDE 10 MEQ/100ML IV SOLN
10.0000 meq | Freq: Once | INTRAVENOUS | Status: AC
  Administered 2017-08-15: 10 meq via INTRAVENOUS
  Filled 2017-08-15: qty 100

## 2017-08-15 MED ORDER — POTASSIUM CHLORIDE CRYS ER 20 MEQ PO TBCR: 40.0000 meq | EXTENDED_RELEASE_TABLET | Freq: Once | ORAL | Status: DC

## 2017-08-15 MED ORDER — SODIUM CHLORIDE 0.9 % IV BOLUS (SEPSIS)
1000.0000 mL | Freq: Once | INTRAVENOUS | Status: AC
  Administered 2017-08-15: 1000 mL via INTRAVENOUS

## 2017-08-15 MED ORDER — POTASSIUM CHLORIDE 20 MEQ/15ML (10%) PO SOLN
40.0000 meq | Freq: Once | ORAL | Status: AC
  Administered 2017-08-15: 40 meq via ORAL
  Filled 2017-08-15: qty 30

## 2017-08-15 NOTE — Discharge Instructions (Signed)
Please read and follow all provided instructions.  Your diagnoses today include:  1. Dehydration   2. Hypokalemia     Tests performed today include: Vital signs. See below for your results today.   Medications prescribed:  Take as prescribed   Home care instructions:  Follow any educational materials contained in this packet.  Follow-up instructions: Please follow-up with your primary care provider for further evaluation of symptoms and treatment   Return instructions:  Please return to the Emergency Department if you do not get better, if you get worse, or new symptoms OR  - Fever (temperature greater than 101.59F)  - Bleeding that does not stop with holding pressure to the area    -Severe pain (please note that you may be more sore the day after your accident)  - Chest Pain  - Difficulty breathing  - Severe nausea or vomiting  - Inability to tolerate food and liquids  - Passing out  - Skin becoming red around your wounds  - Change in mental status (confusion or lethargy)  - New numbness or weakness    Please return if you have any other emergent concerns.  Additional Information:  Your vital signs today were: BP (!) 145/80 (BP Location: Right Arm)    Pulse 79    Temp 98.2 F (36.8 C)    Resp 17    SpO2 99%  If your blood pressure (BP) was elevated above 135/85 this visit, please have this repeated by your doctor within one month. ---------------

## 2017-08-15 NOTE — ED Provider Notes (Signed)
[facility] DEPT Provider Note   CSN: [id number] Arrival date & time: 08/15/17  1528     History   Chief Complaint Chief Complaint  Patient presents with  . Dehydration    HPI Melissa [name] is a 56 y.o. female.  HPI  56 y.o. female with a hx of Best Cancer (left), HTN, presents to the Emergency Department today due to dehydration. Pt states that normally has Chemotherapy on Tuesdays and then has IV fluids the following Monday. Notes diarrhea recently and feels more dehydrated than usual and needs an infusion earlier. States that this occurred on Thursday. Notes hx same with diarrhea at the start of her Chemotherapy treatments. Pt states that she has been feeling weak since Thursday. Pt currently on neoadjuvant chemotherapy today is cycle 5 TCH Perjeta. Denies pain. No cough/congestion. No fevers. No CP/SOB/ABD pain. No recent ABX. No numbness/tingling. No other symptoms noted.   Past Medical History:  Diagnosis Date  . Breast cancer ([place]) 04/2017   left breast  . Cervical cancer ([place])    1990, treated with hysterectomy only  . Hypercholesteremia   . Hypertension     Patient Active Problem List   Diagnosis Date Noted  . Port catheter in place 07/20/2017  . 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, estrogen receptor positive ([place]) 04/30/2017  . Malignant (primary) neoplasm, unspecified ([place]) 04/26/2017  . 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 ABDOMINAL HYSTERECTOMY  1990  . EYE SURGERY     eye lift  . PORTACATH PLACEMENT Right 05/12/2017   Procedure: INSERTION PORT-A-CATH WITH ULTRASOUND;  Surgeon: Rolm Bookbinder, MD;  Location: [facility];  Service: General;  Laterality: Right;    OB History    No data available       Home Medications    Prior to Admission medications   Medication Sig Start Date End Date  Taking? Authorizing Provider  ALPRAZolam Duanne Moron) 0.5 MG tablet Take 0.5 mg by mouth 3 (three) times daily. 04/07/17   [provider]  benzonatate (TESSALON) 100 MG capsule Take 1 capsule (100 mg total) by mouth 3 (three) times daily as needed for cough. 07/23/17   Magrinat, Virgie Dad, MD  calcium carbonate (OS-CAL) 1250 (500 Ca) MG chewable tablet Chew 1 tablet by mouth daily. Calcium 600 mg    [provider]  cholecalciferol (VITAMIN D) 1000 units tablet Take 1,000 Units by mouth daily. 2000 iu    [provider]  dexamethasone (DECADRON) 4 MG tablet Take 1 tablet (4 mg total) by mouth 2 (two) times daily. Take 1 tab day before chemo and 1 tab day after chemo. 04/30/17   Nicholas Lose, MD  diphenoxylate-atropine (LOMOTIL) 2.5-0.025 MG tablet Take 1 tablet by mouth 4 (four) times daily as needed for diarrhea or loose stools. 07/06/17   Nicholas Lose, MD  lidocaine-prilocaine (EMLA) cream Apply to affected area once 04/30/17   Nicholas Lose, MD  loratadine (CLARITIN) 10 MG tablet Take 10 mg by mouth daily.    [provider]  LORazepam (ATIVAN) 0.5 MG tablet TAKE 2 TABLETS BY MOUTH EVERY NIGHT AT BEDTIME FOR SLEEP 07/06/17   Nicholas Lose, MD  methylPREDNISolone (MEDROL DOSEPAK) 4 MG TBPK tablet As directed 07/29/17   Nicholas Lose, MD  ondansetron (ZOFRAN) 8 MG tablet Take 1 tablet (8 mg total) by mouth 2 (two) times daily as needed  for refractory nausea / vomiting. Start on day 3 after chemo. 04/30/17   Nicholas Lose, MD  pantoprazole (PROTONIX) 40 MG tablet Take 40 mg by mouth daily.    [provider]  prochlorperazine (COMPAZINE) 10 MG tablet Take 1 tablet (10 mg total) by mouth every 6 (six) hours as needed (Nausea or vomiting). 04/30/17   Nicholas Lose, MD  promethazine-codeine (PHENERGAN WITH CODEINE) 6.25-10 MG/5ML syrup Take 5 mLs by mouth every 8 (eight) hours as needed for cough. 07/23/17   Magrinat, Virgie Dad, MD  rosuvastatin (CRESTOR) 10 MG tablet   04/07/17   [provider]  triamterene-hydrochlorothiazide (MAXZIDE-25) 37.5-25 MG tablet Take 1 tablet by mouth daily.    [provider]  valACYclovir (VALTREX) 1000 MG tablet take 2 tablets by mouth every 12 hours if needed for 10 days 02/04/17   [provider]    Family History No family history on file.  Social History Social History  Substance Use Topics  . Smoking status: Never Smoker  . Smokeless tobacco: Never Used  . Alcohol use Yes     Comment: socially, wine during the week     Allergies   Patient has no known allergies.   Review of Systems Review of Systems ROS reviewed and all are negative for acute change except as noted in the HPI.  Physical Exam Updated Vital Signs BP (!) 144/88 (BP Location: Right Arm)   Pulse (!) 108   Temp 98.2 F (36.8 C)   Resp 18   SpO2 98%   Physical Exam  Constitutional: She is oriented to person, place, and time. She appears well-developed and well-nourished. No distress.  HENT:  Head: Normocephalic and atraumatic.  Right Ear: Tympanic membrane, external ear and ear canal normal.  Left Ear: Tympanic membrane, external ear and ear canal normal.  Nose: Nose normal.  Mouth/Throat: Uvula is midline, oropharynx is clear and moist and mucous membranes are normal. No trismus in the jaw. No oropharyngeal exudate, posterior oropharyngeal erythema or tonsillar abscesses.  Eyes: Pupils are equal, round, and reactive to light. EOM are normal.  Neck: Normal range of motion. Neck supple. No tracheal deviation present.  Cardiovascular: Normal rate, regular rhythm, S1 normal, S2 normal, normal heart sounds, intact distal pulses and normal pulses.   Pulmonary/Chest: Effort normal and breath sounds normal. No respiratory distress. She has no decreased breath sounds. She has no wheezes. She has no rhonchi. She has no rales.  Abdominal: Normal appearance and bowel sounds are normal. There is no tenderness.    Musculoskeletal: Normal range of motion.  Neurological: She is alert and oriented to person, place, and time.  Skin: Skin is warm and dry.  Psychiatric: She has a normal mood and affect. Her speech is normal and behavior is normal. Thought content normal.  Nursing note and vitals reviewed.    ED Treatments / Results  Labs (all labs ordered are listed, but only abnormal results are displayed) Labs Reviewed  BASIC METABOLIC PANEL - Abnormal; Notable for the following:       Result Value   Sodium 132 (*)    Potassium 2.7 (*)    Chloride 91 (*)    Glucose, Bld 133 (*)    All other components within normal limits  CBC WITH DIFFERENTIAL/PLATELET - Abnormal; Notable for the following:    WBC 3.1 (*)    RBC 3.40 (*)    Hemoglobin 11.4 (*)    HCT 32.1 (*)    All other components within  normal limits  MAGNESIUM    EKG  EKG Interpretation None       Radiology No results found.  Procedures Procedures (including critical care time)  Medications Ordered in ED Medications  potassium chloride 10 mEq in 100 mL IVPB (not administered)  potassium chloride 20 MEQ/15ML (10%) solution 40 mEq (not administered)  sodium chloride 0.9 % bolus 1,000 mL (1,000 mLs Intravenous New Bag/Given 08/15/17 1700)     Initial Impression / Assessment and Plan / ED Course  I have reviewed the triage vital signs and the nursing notes.  Pertinent labs & imaging results that were available during my care of the patient were reviewed by me and considered in my medical decision making (see chart for details).  Final Clinical Impressions(s) / ED Diagnoses  {I have reviewed and evaluated the relevant laboratory values.   {I have reviewed the relevant previous healthcare records.  {I obtained HPI from historian.   ED Course:  Assessment: Pt is a 56 y.o. female with a hx of Best Cancer (left), HTN, presents to the Emergency Department today due to dehydration. Pt states that normally has Chemotherapy on  Tuesdays and then has IV fluids the following Monday. Notes diarrhea recently and feels more dehydrated than usual and needs an infusion earlier. States that this occurred on Thursday. Notes hx same with diarrhea at the start of her Chemotherapy treatments. Pt states that she has been feeling weak since Thursday. Pt currently on neoadjuvant chemotherapy today is cycle 5 TCH Perjeta. Denies pain. No cough/congestion. No fevers. No CP/SOB/ABD pain. No recent ABX. No numbness/tingling. On exam, pt in NAD. Nontoxic/nonseptic appearing. VSS. Afebrile. Lungs CTA. Heart RRR. Abdomen nontender soft. CBC unremarkable. BMP with hypokalemia. Given fluids in ED as well as PO and IV potassium. Plan is to DC home with follow up to [facility]. At time of discharge, Patient is in no acute distress. Vital Signs are stable. Patient is able to ambulate. Patient able to tolerate PO.   Disposition/Plan:  DC Home Additional Verbal discharge instructions given and discussed with patient.  Pt Instructed to f/u with PCP in the next week for evaluation and treatment of symptoms. Return precautions given Pt acknowledges and agrees with plan  Supervising Physician Carmin Muskrat, MD  Final diagnoses:  Dehydration  Hypokalemia    New Prescriptions New Prescriptions   No medications on file     Conni Slipper 08/15/17 Tyrone, Robert, MD 08/15/17 [phone]

## 2017-08-15 NOTE — ED Triage Notes (Signed)
Pt states she feels dehydrated. Pt normally has chemo on Tuesdays, then has IV fluids the following Monday. Pt states she had diarrhea recently and feels more dehydrated than usual and needs infusion earlier. Pt states she felt weak since Thursday.

## 2017-08-15 NOTE — ED Notes (Signed)
No respiratory or acute distress noted alert and oriented x 3 visitor at bedside no reaction to medication noted.

## 2017-08-16 ENCOUNTER — Other Ambulatory Visit: Payer: Self-pay | Admitting: *Deleted

## 2017-08-16 ENCOUNTER — Encounter (HOSPITAL_COMMUNITY): Payer: BLUE CROSS/BLUE SHIELD

## 2017-08-16 ENCOUNTER — Telehealth: Payer: Self-pay | Admitting: *Deleted

## 2017-08-16 DIAGNOSIS — C50612 Malignant neoplasm of axillary tail of left female breast: Secondary | ICD-10-CM

## 2017-08-16 DIAGNOSIS — R21 Rash and other nonspecific skin eruption: Secondary | ICD-10-CM

## 2017-08-16 DIAGNOSIS — Z170 Estrogen receptor positive status [ER+]: Principal | ICD-10-CM

## 2017-08-16 MED ORDER — METHYLPREDNISOLONE 4 MG PO TBPK: ORAL_TABLET | ORAL | 0 refills | Status: DC

## 2017-08-16 NOTE — Telephone Encounter (Signed)
Received call from patient stating she has the rash on her hands like before.  She still states it has a burning feeling, not itchy.  Per Dr. Lindi Adie will call in refill for medrol dose pak.

## 2017-08-24 ENCOUNTER — Other Ambulatory Visit: Payer: Self-pay | Admitting: General Surgery

## 2017-08-30 ENCOUNTER — Other Ambulatory Visit: Payer: Self-pay | Admitting: *Deleted

## 2017-08-31 ENCOUNTER — Telehealth: Payer: Self-pay | Admitting: Hematology and Oncology

## 2017-08-31 ENCOUNTER — Encounter: Payer: Self-pay | Admitting: *Deleted

## 2017-08-31 ENCOUNTER — Other Ambulatory Visit (HOSPITAL_BASED_OUTPATIENT_CLINIC_OR_DEPARTMENT_OTHER): Payer: BLUE CROSS/BLUE SHIELD

## 2017-08-31 ENCOUNTER — Ambulatory Visit: Payer: BLUE CROSS/BLUE SHIELD

## 2017-08-31 ENCOUNTER — Other Ambulatory Visit: Payer: Self-pay

## 2017-08-31 ENCOUNTER — Encounter: Payer: Self-pay | Admitting: Hematology and Oncology

## 2017-08-31 ENCOUNTER — Ambulatory Visit (HOSPITAL_BASED_OUTPATIENT_CLINIC_OR_DEPARTMENT_OTHER): Payer: BLUE CROSS/BLUE SHIELD | Admitting: Hematology and Oncology

## 2017-08-31 ENCOUNTER — Other Ambulatory Visit: Payer: Self-pay | Admitting: *Deleted

## 2017-08-31 ENCOUNTER — Ambulatory Visit (HOSPITAL_BASED_OUTPATIENT_CLINIC_OR_DEPARTMENT_OTHER): Payer: BLUE CROSS/BLUE SHIELD

## 2017-08-31 DIAGNOSIS — Z170 Estrogen receptor positive status [ER+]: Principal | ICD-10-CM

## 2017-08-31 DIAGNOSIS — Z23 Encounter for immunization: Secondary | ICD-10-CM

## 2017-08-31 DIAGNOSIS — C50612 Malignant neoplasm of axillary tail of left female breast: Secondary | ICD-10-CM

## 2017-08-31 DIAGNOSIS — Z5112 Encounter for antineoplastic immunotherapy: Secondary | ICD-10-CM

## 2017-08-31 DIAGNOSIS — Z5111 Encounter for antineoplastic chemotherapy: Secondary | ICD-10-CM

## 2017-08-31 DIAGNOSIS — C801 Malignant (primary) neoplasm, unspecified: Secondary | ICD-10-CM

## 2017-08-31 DIAGNOSIS — R21 Rash and other nonspecific skin eruption: Secondary | ICD-10-CM | POA: Diagnosis not present

## 2017-08-31 DIAGNOSIS — C773 Secondary and unspecified malignant neoplasm of axilla and upper limb lymph nodes: Secondary | ICD-10-CM

## 2017-08-31 DIAGNOSIS — Z95828 Presence of other vascular implants and grafts: Secondary | ICD-10-CM

## 2017-08-31 DIAGNOSIS — G4700 Insomnia, unspecified: Secondary | ICD-10-CM | POA: Diagnosis not present

## 2017-08-31 DIAGNOSIS — R12 Heartburn: Secondary | ICD-10-CM | POA: Diagnosis not present

## 2017-08-31 LAB — CBC WITH DIFFERENTIAL/PLATELET
BASO%: 0.1 % (ref 0.0–2.0)
Basophils Absolute: 0 10*3/uL (ref 0.0–0.1)
EOS%: 0 % (ref 0.0–7.0)
Eosinophils Absolute: 0 10*3/uL (ref 0.0–0.5)
HCT: 33.6 % — ABNORMAL LOW (ref 34.8–46.6)
HGB: 11.5 g/dL — ABNORMAL LOW (ref 11.6–15.9)
LYMPH#: 0.7 10*3/uL — AB (ref 0.9–3.3)
LYMPH%: 13.4 % — ABNORMAL LOW (ref 14.0–49.7)
MCH: 34 pg (ref 25.1–34.0)
MCHC: 34.2 g/dL (ref 31.5–36.0)
MCV: 99.5 fL (ref 79.5–101.0)
MONO#: 0.1 10*3/uL (ref 0.1–0.9)
MONO%: 2 % (ref 0.0–14.0)
NEUT#: 4.6 10*3/uL (ref 1.5–6.5)
NEUT%: 84.5 % — AB (ref 38.4–76.8)
Platelets: 186 10*3/uL (ref 145–400)
RBC: 3.38 10*6/uL — AB (ref 3.70–5.45)
RDW: 17.2 % — ABNORMAL HIGH (ref 11.2–14.5)
WBC: 5.5 10*3/uL (ref 3.9–10.3)

## 2017-08-31 LAB — COMPREHENSIVE METABOLIC PANEL
ALT: 28 U/L (ref 0–55)
AST: 21 U/L (ref 5–34)
Albumin: 3.9 g/dL (ref 3.5–5.0)
Alkaline Phosphatase: 70 U/L (ref 40–150)
Anion Gap: 11 mEq/L (ref 3–11)
BUN: 13.9 mg/dL (ref 7.0–26.0)
CHLORIDE: 100 meq/L (ref 98–109)
CO2: 27 meq/L (ref 22–29)
CREATININE: 0.9 mg/dL (ref 0.6–1.1)
Calcium: 9.9 mg/dL (ref 8.4–10.4)
EGFR: 74 mL/min/{1.73_m2} — ABNORMAL LOW (ref >90)
Glucose: 207 mg/dl — ABNORMAL HIGH (ref 70–140)
POTASSIUM: 3.3 meq/L — AB (ref 3.5–5.1)
SODIUM: 137 meq/L (ref 136–145)
Total Bilirubin: 0.58 mg/dL (ref 0.20–1.20)
Total Protein: 6.9 g/dL (ref 6.4–8.3)

## 2017-08-31 MED ORDER — DIPHENHYDRAMINE HCL 25 MG PO CAPS
50.0000 mg | ORAL_CAPSULE | Freq: Once | ORAL | Status: AC
  Administered 2017-08-31: 50 mg via ORAL

## 2017-08-31 MED ORDER — PEGFILGRASTIM 6 MG/0.6ML ~~LOC~~ PSKT
6.0000 mg | PREFILLED_SYRINGE | Freq: Once | SUBCUTANEOUS | Status: DC
  Filled 2017-08-31: qty 0.6

## 2017-08-31 MED ORDER — DIPHENHYDRAMINE HCL 25 MG PO CAPS
ORAL_CAPSULE | ORAL | Status: AC
  Filled 2017-08-31: qty 2

## 2017-08-31 MED ORDER — TRASTUZUMAB CHEMO 150 MG IV SOLR
450.0000 mg | Freq: Once | INTRAVENOUS | Status: AC
  Administered 2017-08-31: 450 mg via INTRAVENOUS
  Filled 2017-08-31: qty 21.43

## 2017-08-31 MED ORDER — SODIUM CHLORIDE 0.9 % IV SOLN
420.0000 mg | Freq: Once | INTRAVENOUS | Status: AC
  Administered 2017-08-31: 420 mg via INTRAVENOUS
  Filled 2017-08-31: qty 14

## 2017-08-31 MED ORDER — SODIUM CHLORIDE 0.9% FLUSH
10.0000 mL | INTRAVENOUS | Status: DC | PRN
  Administered 2017-08-31: 10 mL via INTRAVENOUS
  Filled 2017-08-31: qty 10

## 2017-08-31 MED ORDER — DEXAMETHASONE SODIUM PHOSPHATE 10 MG/ML IJ SOLN
10.0000 mg | Freq: Once | INTRAMUSCULAR | Status: AC
  Administered 2017-08-31: 10 mg via INTRAVENOUS

## 2017-08-31 MED ORDER — SODIUM CHLORIDE 0.9 % IV SOLN
500.0000 mg | Freq: Once | INTRAVENOUS | Status: AC
  Administered 2017-08-31: 500 mg via INTRAVENOUS
  Filled 2017-08-31: qty 50

## 2017-08-31 MED ORDER — HEPARIN SOD (PORK) LOCK FLUSH 100 UNIT/ML IV SOLN
500.0000 [IU] | Freq: Once | INTRAVENOUS | Status: AC | PRN
  Administered 2017-08-31: 500 [IU]
  Filled 2017-08-31: qty 5

## 2017-08-31 MED ORDER — ACETAMINOPHEN 325 MG PO TABS
650.0000 mg | ORAL_TABLET | Freq: Once | ORAL | Status: AC
  Administered 2017-08-31: 650 mg via ORAL

## 2017-08-31 MED ORDER — INFLUENZA VAC SPLIT QUAD 0.5 ML IM SUSY
0.5000 mL | PREFILLED_SYRINGE | Freq: Once | INTRAMUSCULAR | Status: AC
  Administered 2017-08-31: 0.5 mL via INTRAMUSCULAR
  Filled 2017-08-31: qty 0.5

## 2017-08-31 MED ORDER — DEXAMETHASONE SODIUM PHOSPHATE 10 MG/ML IJ SOLN
INTRAMUSCULAR | Status: AC
  Filled 2017-08-31: qty 1

## 2017-08-31 MED ORDER — PALONOSETRON HCL INJECTION 0.25 MG/5ML
0.2500 mg | Freq: Once | INTRAVENOUS | Status: AC
  Administered 2017-08-31: 0.25 mg via INTRAVENOUS

## 2017-08-31 MED ORDER — ACETAMINOPHEN 325 MG PO TABS
ORAL_TABLET | ORAL | Status: AC
  Filled 2017-08-31: qty 2

## 2017-08-31 MED ORDER — SODIUM CHLORIDE 0.9 % IV SOLN
Freq: Once | INTRAVENOUS | Status: AC
  Administered 2017-08-31: 10:00:00 via INTRAVENOUS

## 2017-08-31 MED ORDER — PALONOSETRON HCL INJECTION 0.25 MG/5ML
INTRAVENOUS | Status: AC
  Filled 2017-08-31: qty 5

## 2017-08-31 MED ORDER — SODIUM CHLORIDE 0.9% FLUSH
10.0000 mL | INTRAVENOUS | Status: DC | PRN
  Administered 2017-08-31: 10 mL
  Filled 2017-08-31: qty 10

## 2017-08-31 MED ORDER — INFLUENZA VAC SPLIT QUAD 0.5 ML IM SUSY
0.5000 mL | PREFILLED_SYRINGE | Freq: Once | INTRAMUSCULAR | Status: DC
  Filled 2017-08-31: qty 0.5

## 2017-08-31 MED ORDER — SODIUM CHLORIDE 0.9 % IV SOLN
50.0000 mg/m2 | Freq: Once | INTRAVENOUS | Status: AC
  Administered 2017-08-31: 90 mg via INTRAVENOUS
  Filled 2017-08-31: qty 9

## 2017-08-31 NOTE — Assessment & Plan Note (Signed)
04/14/2017 Left axillary lymph node biopsy: Metastatic carcinoma positive for CK 7, ER positive,GATA-3 equivocal, negative for CK 20,GCDFP, CDX-2, TTF-1; HER-2 positive ratio 2.08 mammogram and ultrasound revealed 3 discrete abnormal left axillary lymph nodes largest measures 1.8 cm Clinical stage: TX N1MX  Treatment plan: 1. Neoadjuvant TCH Perjeta 6 cycles followed by Herceptin maintenance for 1 year 2. followed by axillary lymph node dissection. Breast surgery will not be performed because there is no primary tumor identifiable on breast MRI 3. Followed by radiation 4. Followed by antiestrogen therapy ------------------------------------------------------------------------------------------------------------------ Current Treatment: today is cycle 6 ofTCHP  Chemotherapy toxicities: 1. Constipation alternating with profound diarrhea: Patient took Imodium and it is improving 2.severe heartburn: OnProtonix. 3.Nausea related to chemotherapy:Zofran or Compazine. 4. Insomnia: Ativan to 1 mg daily. 5. Dehydration: IV fluids to be given next Monday 6. Diarrhea due to Perjeta well-controlled with Lomotil and Imodium 7. Bone pain due to Neulasta: Well controlled with Claritin 8. Rash on the arms: Prescribed Medrol Dose Pak  RTC after breast MRI and presentation tumor board

## 2017-08-31 NOTE — Telephone Encounter (Signed)
Left message for patient regarding her appt on 9/27. Scheduled per 9/25 sch msg

## 2017-08-31 NOTE — Addendum Note (Signed)
Addended by: Tora Kindred on: 08/31/2017 03:04 PM   Modules accepted: Orders

## 2017-08-31 NOTE — Patient Instructions (Signed)
Cancer Center Discharge Instructions for Patients Receiving Chemotherapy  Today you received the following chemotherapy agents: Herceptin, Perjeta, Taxotere, Carboplatin.  To help prevent nausea and vomiting after your treatment, we encourage you to take your nausea medication as prescribed. If you develop nausea and vomiting that is not controlled by your nausea medication, call the clinic.   BELOW ARE SYMPTOMS THAT SHOULD BE REPORTED IMMEDIATELY:  *FEVER GREATER THAN 100.5 F  *CHILLS WITH OR WITHOUT FEVER  NAUSEA AND VOMITING THAT IS NOT CONTROLLED WITH YOUR NAUSEA MEDICATION  *UNUSUAL SHORTNESS OF BREATH  *UNUSUAL BRUISING OR BLEEDING  TENDERNESS IN MOUTH AND THROAT WITH OR WITHOUT PRESENCE OF ULCERS  *URINARY PROBLEMS  *BOWEL PROBLEMS  UNUSUAL RASH Items with * indicate a potential emergency and should be followed up as soon as possible.  Feel free to call the clinic should you have any questions or concerns. The clinic phone number is [phone].  Please show the CHEMO ALERT CARD at check-in to the Emergency Department and triage nurse.   

## 2017-08-31 NOTE — Progress Notes (Signed)
Patient Care Team: Deland Pretty, MD as PCP - General (Internal Medicine)  DIAGNOSIS:  Encounter Diagnosis  Name Primary?  . 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[place])     SUMMARY OF ONCOLOGIC HISTORY:   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[place])   04/14/2017 Initial Diagnosis    Left axillary lymph node biopsy: Metastatic carcinoma positive for CK 7, ER positive,GATA-3 equivocal, negative for CK 20,GCDFP, CDX-2, TTF-1; HER-2 positive ratio 2.08; mammogram and ultrasound revealed 3 discrete abnormal left axillary lymph nodes largest measures 1.8 cm      04/23/2017 Breast MRI    Left axillary lymphadenopathy numerous enlarged level I axillary lymph nodes largest 2.1 cm, no other abdominal masses in the left breast itself, no MRI evidence of malignancy in the right breast      04/26/2017 Imaging    CT CAP: Prominent left axillary lymph nodes no distant metastases, sclerotic focus left pedicle of T9 vertebra favored benign, T5 vertebral hemangioma      05/12/2017 -  Neo-Adjuvant Chemotherapy    TCH Perjeta       CHIEF COMPLIANT: TCH Perjeta cycle 6  INTERVAL HISTORY: Melissa [name] is a 56 year old with above-mentioned history of left breast cancer currently on neoadjuvant chemotherapy with TCH Perjeta. Today is cycle 6 of treatment. She is super excited about this. She did have diarrhea and dehydration and required a trip to the emergency room after the last cycle. She would like to receive fluids this Saturday. After she receives the fluids she feels significantly better. She denies neuropathy. Her nails are brittle. The rash on the dorsum of her hands has improved with prednisone.  REVIEW OF SYSTEMS:   Constitutional: Denies fevers, chills or abnormal weight loss Eyes: Denies blurriness of vision Ears, nose, mouth, throat, and face: Denies mucositis or sore throat Respiratory: Denies cough,  dyspnea or wheezes Cardiovascular: Denies palpitation, chest discomfort Gastrointestinal:  Denies nausea, heartburn or change in bowel habits Skin: Denies abnormal skin rashes Lymphatics: Denies new lymphadenopathy or easy bruising Neurological:Denies numbness, tingling or new weaknesses Behavioral/Psych: Mood is stable, no new changes  Extremities: No lower extremity edema Breast:  denies any pain or lumps or nodules in either breasts All other systems were reviewed with the patient and are negative.  I have reviewed the past medical history, past surgical history, social history and family history with the patient and they are unchanged from previous note.  ALLERGIES:  has No Known Allergies.  MEDICATIONS:  Current Outpatient Prescriptions  Medication Sig Dispense Refill  . ALPRAZolam (XANAX) 0.5 MG tablet Take 0.5 mg by mouth 3 (three) times daily.  0  . benzonatate (TESSALON) 100 MG capsule Take 1 capsule (100 mg total) by mouth 3 (three) times daily as needed for cough. 20 capsule 0  . calcium carbonate (OS-CAL) 1250 (500 Ca) MG chewable tablet Chew 1 tablet by mouth daily. Calcium 600 mg    . cholecalciferol (VITAMIN D) 1000 units tablet Take 1,000 Units by mouth daily. 2000 iu    . dexamethasone (DECADRON) 4 MG tablet Take 1 tablet (4 mg total) by mouth 2 (two) times daily. Take 1 tab day before chemo and 1 tab day after chemo. 30 tablet 1  . diphenoxylate-atropine (LOMOTIL) 2.5-0.025 MG tablet Take 1 tablet by mouth 4 (four) times daily as needed for diarrhea or loose stools. 30 tablet 1  . lidocaine-prilocaine (EMLA) cream Apply to affected area once  30 g 3  . loratadine (CLARITIN) 10 MG tablet Take 10 mg by mouth daily.    Marland Kitchen LORazepam (ATIVAN) 0.5 MG tablet TAKE 2 TABLETS BY MOUTH EVERY NIGHT AT BEDTIME FOR SLEEP 60 tablet 1  . methylPREDNISolone (MEDROL DOSEPAK) 4 MG TBPK tablet As directed 1 tablet 0  . ondansetron (ZOFRAN) 8 MG tablet Take 1 tablet (8 mg total) by mouth 2  (two) times daily as needed for refractory nausea / vomiting. Start on day 3 after chemo. 30 tablet 1  . pantoprazole (PROTONIX) 40 MG tablet Take 40 mg by mouth daily.    . prochlorperazine (COMPAZINE) 10 MG tablet Take 1 tablet (10 mg total) by mouth every 6 (six) hours as needed (Nausea or vomiting). 30 tablet 1  . promethazine-codeine (PHENERGAN WITH CODEINE) 6.25-10 MG/5ML syrup Take 5 mLs by mouth every 8 (eight) hours as needed for cough. 120 mL 0  . rosuvastatin (CRESTOR) 10 MG tablet   0  . triamterene-hydrochlorothiazide (MAXZIDE-25) 37.5-25 MG tablet Take 1 tablet by mouth daily.    . valACYclovir (VALTREX) 1000 MG tablet take 2 tablets by mouth every 12 hours if needed for 10 days  0   No current facility-administered medications for this visit.    Facility-Administered Medications Ordered in Other Visits  Medication Dose Route Frequency Provider Last Rate Last Dose  . CARBOplatin (PARAPLATIN) 500 mg in sodium chloride 0.9 % 250 mL chemo infusion  500 mg Intravenous Once Nicholas Lose, MD      . DOCEtaxel (TAXOTERE) 90 mg in sodium chloride 0.9 % 250 mL chemo infusion  50 mg/m2 (Treatment Plan Recorded) Intravenous Once Nicholas Lose, MD      . heparin lock flush 100 unit/mL  500 Units Intracatheter Once PRN Nicholas Lose, MD      . pertuzumab (PERJETA) 420 mg in sodium chloride 0.9 % 250 mL chemo infusion  420 mg Intravenous Once Nicholas Lose, MD 528 mL/hr at 08/31/17 1140 420 mg at 08/31/17 1140  . sodium chloride flush (NS) 0.9 % injection 10 mL  10 mL Intracatheter PRN Nicholas Lose, MD        PHYSICAL EXAMINATION: ECOG PERFORMANCE STATUS: 1 - Symptomatic but completely ambulatory  Vitals:   08/31/17 0921  BP: 132/70  Pulse: 82  Resp: 18  Temp: 98.2 F (36.8 C)  SpO2: 100%   Filed Weights   08/31/17 0921  Weight: 151 lb 3.2 oz (68.6 kg)    GENERAL:alert, no distress and comfortable SKIN: skin color, texture, turgor are normal, no rashes or significant  lesions EYES: normal, Conjunctiva are pink and non-injected, sclera clear OROPHARYNX:no exudate, no erythema and lips, buccal mucosa, and tongue normal  NECK: supple, thyroid normal size, non-tender, without nodularity LYMPH:  no palpable lymphadenopathy in the cervical, axillary or inguinal LUNGS: clear to auscultation and percussion with normal breathing effort HEART: regular rate & rhythm and no murmurs and no lower extremity edema ABDOMEN:abdomen soft, non-tender and normal bowel sounds MUSCULOSKELETAL:no cyanosis of digits and no clubbing  NEURO: alert & oriented x 3 with fluent speech, no focal motor/sensory deficits EXTREMITIES: No lower extremity edema BREAST: No palpable masses or nodules in either right or left breasts. No palpable axillary supraclavicular or infraclavicular adenopathy no breast tenderness or nipple discharge. (exam performed in the presence of a chaperone)  LABORATORY DATA:  I have reviewed the data as listed   Chemistry      Component Value Date/Time   NA 137 08/31/2017 0818   K 3.3 (  L) 08/31/2017 0818   CL 91 (L) 08/15/2017 1654   CO2 27 08/31/2017 0818   BUN 13.9 08/31/2017 0818   CREATININE 0.9 08/31/2017 0818      Component Value Date/Time   CALCIUM 9.9 08/31/2017 0818   ALKPHOS 70 08/31/2017 0818   AST 21 08/31/2017 0818   ALT 28 08/31/2017 0818   BILITOT 0.58 08/31/2017 0818       Lab Results  Component Value Date   WBC 5.5 08/31/2017   HGB 11.5 (L) 08/31/2017   HCT 33.6 (L) 08/31/2017   MCV 99.5 08/31/2017   PLT 186 08/31/2017   NEUTROABS 4.6 08/31/2017    ASSESSMENT & PLAN:  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) 04/14/2017 Left axillary lymph node biopsy: Metastatic carcinoma positive for CK 7, ER positive,GATA-3 equivocal, negative for CK 20,GCDFP, CDX-2, TTF-1; HER-2 positive ratio 2.08 mammogram and ultrasound revealed 3 discrete abnormal left axillary lymph nodes largest measures  1.8 cm Clinical stage: TX N1MX  Treatment plan: 1. Neoadjuvant TCH Perjeta 6 cycles followed by Herceptin maintenance for 1 year 2. followed by axillary lymph node dissection. Breast surgery will not be performed because there is no primary tumor identifiable on breast MRI 3. Followed by radiation 4. Followed by antiestrogen therapy ------------------------------------------------------------------------------------------------------------------ Current Treatment: today is cycle 6 ofTCHP  Chemotherapy toxicities: 1. Constipation alternating with profound diarrhea: Patient took Imodium and it is improving 2.severe heartburn: OnProtonix. 3.Nausea related to chemotherapy:Zofran or Compazine. 4. Insomnia: Ativan to 1 mg daily. 5. Dehydration: IV fluids to be given next Saturday 6. Diarrhea due to Perjeta well-controlled with Lomotil and Imodium 7. Bone pain due to Neulasta: Well controlled with Claritin 8. Rash on the arms: Prescribed Medrol Dose Pak  Return to clinic after surgery. We will cancel her appointment for next Wednesday.   I spent 25 minutes talking to the patient of which more than half was spent in counseling and coordination of care.  No orders of the defined types were placed in this encounter.  The patient has a good understanding of the overall plan. she agrees with it. she will call with any problems that may develop before the next visit here.   Rulon Eisenmenger, MD 08/31/17

## 2017-09-01 ENCOUNTER — Ambulatory Visit (HOSPITAL_COMMUNITY)
Admission: RE | Admit: 2017-09-01 | Discharge: 2017-09-01 | Disposition: A | Discharge status: Home / Self Care | Payer: BLUE CROSS/BLUE SHIELD | Source: Ambulatory Visit | Attending: Hematology and Oncology | Admitting: Hematology and Oncology

## 2017-09-01 DIAGNOSIS — C773 Secondary and unspecified malignant neoplasm of axilla and upper limb lymph nodes: Secondary | ICD-10-CM

## 2017-09-01 DIAGNOSIS — C779 Secondary and unspecified malignant neoplasm of lymph node, unspecified: Secondary | ICD-10-CM | POA: Diagnosis not present

## 2017-09-01 MED ORDER — GADOBENATE DIMEGLUMINE 529 MG/ML IV SOLN
15.0000 mL | Freq: Once | INTRAVENOUS | Status: AC | PRN
  Administered 2017-09-01: 14 mL via INTRAVENOUS

## 2017-09-02 ENCOUNTER — Ambulatory Visit (HOSPITAL_BASED_OUTPATIENT_CLINIC_OR_DEPARTMENT_OTHER): Payer: BLUE CROSS/BLUE SHIELD

## 2017-09-02 VITALS — BP 126/68 | HR 80 | Temp 98.4°F | Resp 18

## 2017-09-02 DIAGNOSIS — C50612 Malignant neoplasm of axillary tail of left female breast: Secondary | ICD-10-CM

## 2017-09-02 DIAGNOSIS — Z5189 Encounter for other specified aftercare: Secondary | ICD-10-CM | POA: Diagnosis not present

## 2017-09-02 DIAGNOSIS — C773 Secondary and unspecified malignant neoplasm of axilla and upper limb lymph nodes: Secondary | ICD-10-CM | POA: Diagnosis not present

## 2017-09-02 DIAGNOSIS — Z170 Estrogen receptor positive status [ER+]: Principal | ICD-10-CM

## 2017-09-02 DIAGNOSIS — C50912 Malignant neoplasm of unspecified site of left female breast: Secondary | ICD-10-CM | POA: Diagnosis not present

## 2017-09-02 DIAGNOSIS — C801 Malignant (primary) neoplasm, unspecified: Secondary | ICD-10-CM

## 2017-09-02 MED ORDER — PEGFILGRASTIM INJECTION 6 MG/0.6ML ~~LOC~~
6.0000 mg | PREFILLED_SYRINGE | Freq: Once | SUBCUTANEOUS | Status: AC
  Administered 2017-09-02: 6 mg via SUBCUTANEOUS
  Filled 2017-09-02: qty 0.6

## 2017-09-04 ENCOUNTER — Ambulatory Visit (HOSPITAL_BASED_OUTPATIENT_CLINIC_OR_DEPARTMENT_OTHER): Payer: BLUE CROSS/BLUE SHIELD

## 2017-09-04 VITALS — BP 119/65 | HR 91 | Temp 98.2°F | Resp 18

## 2017-09-04 DIAGNOSIS — E860 Dehydration: Secondary | ICD-10-CM

## 2017-09-04 DIAGNOSIS — C801 Malignant (primary) neoplasm, unspecified: Secondary | ICD-10-CM

## 2017-09-04 DIAGNOSIS — C50912 Malignant neoplasm of unspecified site of left female breast: Secondary | ICD-10-CM

## 2017-09-04 DIAGNOSIS — C773 Secondary and unspecified malignant neoplasm of axilla and upper limb lymph nodes: Secondary | ICD-10-CM | POA: Diagnosis not present

## 2017-09-04 MED ORDER — HEPARIN SOD (PORK) LOCK FLUSH 100 UNIT/ML IV SOLN
500.0000 [IU] | INTRAVENOUS | Status: AC | PRN
  Administered 2017-09-04: 500 [IU]
  Filled 2017-09-04: qty 5

## 2017-09-04 MED ORDER — SODIUM CHLORIDE 0.9 % IV SOLN
Freq: Once | INTRAVENOUS | Status: AC
  Administered 2017-09-04: 11:00:00 via INTRAVENOUS

## 2017-09-04 MED ORDER — SODIUM CHLORIDE 0.9% FLUSH
10.0000 mL | INTRAVENOUS | Status: AC | PRN
  Administered 2017-09-04: 10 mL
  Filled 2017-09-04: qty 10

## 2017-09-04 NOTE — Patient Instructions (Signed)
Dehydration, Adult Dehydration is a condition in which there is not enough fluid or water in the body. This happens when you lose more fluids than you take in. Important organs, such as the kidneys, brain, and heart, cannot function without a proper amount of fluids. Any loss of fluids from the body can lead to dehydration. Dehydration can range from mild to severe. This condition should be treated right away to prevent it from becoming severe. What are the causes? This condition may be caused by:  Vomiting.  Diarrhea.  Excessive sweating, such as from heat exposure or exercise.  Not drinking enough fluid, especially: ? When ill. ? While doing activity that requires a lot of energy.  Excessive urination.  Fever.  Infection.  Certain medicines, such as medicines that cause the body to lose excess fluid (diuretics).  Inability to access safe drinking water.  Reduced physical ability to get adequate water and food.  What increases the risk? This condition is more likely to develop in people:  Who have a poorly controlled long-term (chronic) illness, such as diabetes, heart disease, or kidney disease.  Who are age 65 or older.  Who are disabled.  Who live in a place with high altitude.  Who play endurance sports.  What are the signs or symptoms? Symptoms of mild dehydration may include:  Thirst.  Dry lips.  Slightly dry mouth.  Dry, warm skin.  Dizziness. Symptoms of moderate dehydration may include:  Very dry mouth.  Muscle cramps.  Dark urine. Urine may be the color of tea.  Decreased urine production.  Decreased tear production.  Heartbeat that is irregular or faster than normal (palpitations).  Headache.  Light-headedness, especially when you stand up from a sitting position.  Fainting (syncope). Symptoms of severe dehydration may include:  Changes in skin, such as: ? Cold and clammy skin. ? Blotchy (mottled) or pale skin. ? Skin that does  not quickly return to normal after being lightly pinched and released (poor skin turgor).  Changes in body fluids, such as: ? Extreme thirst. ? No tear production. ? Inability to sweat when body temperature is high, such as in hot weather. ? Very little urine production.  Changes in vital signs, such as: ? Weak pulse. ? Pulse that is more than 100 beats a minute when sitting still. ? Rapid breathing. ? Low blood pressure.  Other changes, such as: ? Sunken eyes. ? Cold hands and feet. ? Confusion. ? Lack of energy (lethargy). ? Difficulty waking up from sleep. ? Short-term weight loss. ? Unconsciousness. How is this diagnosed? This condition is diagnosed based on your symptoms and a physical exam. Blood and urine tests may be done to help confirm the diagnosis. How is this treated? Treatment for this condition depends on the severity. Mild or moderate dehydration can often be treated at home. Treatment should be started right away. Do not wait until dehydration becomes severe. Severe dehydration is an emergency and it needs to be treated in a hospital. Treatment for mild dehydration may include:  Drinking more fluids.  Replacing salts and minerals in your blood (electrolytes) that you may have lost. Treatment for moderate dehydration may include:  Drinking an oral rehydration solution (ORS). This is a drink that helps you replace fluids and electrolytes (rehydrate). It can be found at pharmacies and retail stores. Treatment for severe dehydration may include:  Receiving fluids through an IV tube.  Receiving an electrolyte solution through a feeding tube that is passed through your nose   and into your stomach (nasogastric tube, or NG tube).  Correcting any abnormalities in electrolytes.  Treating the underlying cause of dehydration. Follow these instructions at home:  If directed by your health care provider, drink an ORS: ? Make an ORS by following instructions on the  package. ? Start by drinking small amounts, about  cup (120 mL) every 5-10 minutes. ? Slowly increase how much you drink until you have taken the amount recommended by your health care provider.  Drink enough clear fluid to keep your urine clear or pale yellow. If you were told to drink an ORS, finish the ORS first, then start slowly drinking other clear fluids. Drink fluids such as: ? Water. Do not drink only water. Doing that can lead to having too little salt (sodium) in the body (hyponatremia). ? Ice chips. ? Fruit juice that you have added water to (diluted fruit juice). ? Low-calorie sports drinks.  Avoid: ? Alcohol. ? Drinks that contain a lot of sugar. These include high-calorie sports drinks, fruit juice that is not diluted, and soda. ? Caffeine. ? Foods that are greasy or contain a lot of fat or sugar.  Take over-the-counter and prescription medicines only as told by your health care provider.  Do not take sodium tablets. This can lead to having too much sodium in the body (hypernatremia).  Eat foods that contain a healthy balance of electrolytes, such as bananas, oranges, potatoes, tomatoes, and spinach.  Keep all follow-up visits as told by your health care provider. This is important. Contact a health care provider if:  You have abdominal pain that: ? Gets worse. ? Stays in one area (localizes).  You have a rash.  You have a stiff neck.  You are more irritable than usual.  You are sleepier or more difficult to wake up than usual.  You feel weak or dizzy.  You feel very thirsty.  You have urinated only a small amount of very dark urine over 6-8 hours. Get help right away if:  You have symptoms of severe dehydration.  You cannot drink fluids without vomiting.  Your symptoms get worse with treatment.  You have a fever.  You have a severe headache.  You have vomiting or diarrhea that: ? Gets worse. ? Does not go away.  You have blood or green matter  (bile) in your vomit.  You have blood in your stool. This may cause stool to look black and tarry.  You have not urinated in 6-8 hours.  You faint.  Your heart rate while sitting still is over 100 beats a minute.  You have trouble breathing. This information is not intended to replace advice given to you by your health care provider. Make sure you discuss any questions you have with your health care provider. Document Released: 11/23/2005 Document Revised: 06/19/2016 Document Reviewed: 01/17/2016 Elsevier Interactive Patient Education  2018 [employer]  

## 2017-09-06 ENCOUNTER — Other Ambulatory Visit: Payer: Self-pay | Admitting: Hematology and Oncology

## 2017-09-06 DIAGNOSIS — C50612 Malignant neoplasm of axillary tail of left female breast: Secondary | ICD-10-CM

## 2017-09-06 DIAGNOSIS — Z170 Estrogen receptor positive status [ER+]: Principal | ICD-10-CM

## 2017-09-06 DIAGNOSIS — R21 Rash and other nonspecific skin eruption: Secondary | ICD-10-CM

## 2017-09-08 ENCOUNTER — Encounter: Payer: BLUE CROSS/BLUE SHIELD | Admitting: Hematology and Oncology

## 2017-09-09 ENCOUNTER — Telehealth: Payer: Self-pay | Admitting: Hematology and Oncology

## 2017-09-09 NOTE — Telephone Encounter (Signed)
Alerted by navigator to scheduled appointments per 9/25 los. Patient appointment status as a no show. Appointments scheduled and patient contacted re appointments for 10/16, 11/6 and 11/27.

## 2017-09-09 NOTE — Progress Notes (Signed)
This encounter was created in error - please disregard.

## 2017-09-16 ENCOUNTER — Encounter: Payer: Self-pay | Admitting: *Deleted

## 2017-09-21 ENCOUNTER — Encounter: Payer: Self-pay | Admitting: *Deleted

## 2017-09-21 ENCOUNTER — Ambulatory Visit (HOSPITAL_BASED_OUTPATIENT_CLINIC_OR_DEPARTMENT_OTHER): Payer: BLUE CROSS/BLUE SHIELD

## 2017-09-21 ENCOUNTER — Other Ambulatory Visit (HOSPITAL_BASED_OUTPATIENT_CLINIC_OR_DEPARTMENT_OTHER): Payer: BLUE CROSS/BLUE SHIELD

## 2017-09-21 ENCOUNTER — Other Ambulatory Visit: Payer: Self-pay

## 2017-09-21 VITALS — BP 136/69 | HR 70 | Temp 98.1°F | Resp 17 | Ht 68.0 in

## 2017-09-21 DIAGNOSIS — C773 Secondary and unspecified malignant neoplasm of axilla and upper limb lymph nodes: Secondary | ICD-10-CM | POA: Diagnosis not present

## 2017-09-21 DIAGNOSIS — C50612 Malignant neoplasm of axillary tail of left female breast: Secondary | ICD-10-CM

## 2017-09-21 DIAGNOSIS — C801 Malignant (primary) neoplasm, unspecified: Secondary | ICD-10-CM

## 2017-09-21 DIAGNOSIS — Z5112 Encounter for antineoplastic immunotherapy: Secondary | ICD-10-CM | POA: Diagnosis not present

## 2017-09-21 DIAGNOSIS — Z170 Estrogen receptor positive status [ER+]: Principal | ICD-10-CM

## 2017-09-21 DIAGNOSIS — E876 Hypokalemia: Secondary | ICD-10-CM

## 2017-09-21 LAB — COMPREHENSIVE METABOLIC PANEL
ALT: 34 U/L (ref 0–55)
ANION GAP: 11 meq/L (ref 3–11)
AST: 32 U/L (ref 5–34)
Albumin: 3.8 g/dL (ref 3.5–5.0)
Alkaline Phosphatase: 60 U/L (ref 40–150)
BILIRUBIN TOTAL: 0.45 mg/dL (ref 0.20–1.20)
BUN: 8.4 mg/dL (ref 7.0–26.0)
CO2: 31 meq/L — AB (ref 22–29)
Calcium: 9.5 mg/dL (ref 8.4–10.4)
Chloride: 99 mEq/L (ref 98–109)
Creatinine: 1 mg/dL (ref 0.6–1.1)
EGFR: 59 mL/min/{1.73_m2} — AB (ref >60)
Glucose: 134 mg/dl (ref 70–140)
Potassium: 2.9 mEq/L — CL (ref 3.5–5.1)
Sodium: 141 mEq/L (ref 136–145)
TOTAL PROTEIN: 6.8 g/dL (ref 6.4–8.3)

## 2017-09-21 LAB — CBC WITH DIFFERENTIAL/PLATELET
BASO%: 0.6 % (ref 0.0–2.0)
BASOS ABS: 0 10*3/uL (ref 0.0–0.1)
EOS ABS: 0 10*3/uL (ref 0.0–0.5)
EOS%: 0.1 % (ref 0.0–7.0)
HCT: 32 % — ABNORMAL LOW (ref 34.8–46.6)
HGB: 10.9 g/dL — ABNORMAL LOW (ref 11.6–15.9)
LYMPH%: 30.1 % (ref 14.0–49.7)
MCH: 34.6 pg — AB (ref 25.1–34.0)
MCHC: 34.2 g/dL (ref 31.5–36.0)
MCV: 101.1 fL — AB (ref 79.5–101.0)
MONO#: 0.4 10*3/uL (ref 0.1–0.9)
MONO%: 8.2 % (ref 0.0–14.0)
NEUT%: 61 % (ref 38.4–76.8)
NEUTROS ABS: 3.3 10*3/uL (ref 1.5–6.5)
PLATELETS: 184 10*3/uL (ref 145–400)
RBC: 3.17 10*6/uL — AB (ref 3.70–5.45)
RDW: 16.7 % — ABNORMAL HIGH (ref 11.2–14.5)
WBC: 5.3 10*3/uL (ref 3.9–10.3)
lymph#: 1.6 10*3/uL (ref 0.9–3.3)

## 2017-09-21 MED ORDER — ACETAMINOPHEN 325 MG PO TABS
ORAL_TABLET | ORAL | Status: AC
  Filled 2017-09-21: qty 2

## 2017-09-21 MED ORDER — SODIUM CHLORIDE 0.9 % IV SOLN
450.0000 mg | Freq: Once | INTRAVENOUS | Status: AC
  Administered 2017-09-21: 450 mg via INTRAVENOUS
  Filled 2017-09-21: qty 21.43

## 2017-09-21 MED ORDER — SODIUM CHLORIDE 0.9% FLUSH
10.0000 mL | INTRAVENOUS | Status: DC | PRN
  Administered 2017-09-21: 10 mL
  Filled 2017-09-21: qty 10

## 2017-09-21 MED ORDER — SODIUM CHLORIDE 0.9 % IV SOLN
420.0000 mg | Freq: Once | INTRAVENOUS | Status: AC
  Administered 2017-09-21: 420 mg via INTRAVENOUS
  Filled 2017-09-21: qty 14

## 2017-09-21 MED ORDER — HEPARIN SOD (PORK) LOCK FLUSH 100 UNIT/ML IV SOLN
500.0000 [IU] | Freq: Once | INTRAVENOUS | Status: AC | PRN
  Administered 2017-09-21: 500 [IU]
  Filled 2017-09-21: qty 5

## 2017-09-21 MED ORDER — ACETAMINOPHEN 325 MG PO TABS
650.0000 mg | ORAL_TABLET | Freq: Once | ORAL | Status: AC
  Administered 2017-09-21: 650 mg via ORAL

## 2017-09-21 MED ORDER — POTASSIUM CHLORIDE CRYS ER 20 MEQ PO TBCR: 20.0000 meq | EXTENDED_RELEASE_TABLET | Freq: Every day | ORAL | 0 refills | Status: DC

## 2017-09-21 MED ORDER — SODIUM CHLORIDE 0.9 % IV SOLN
Freq: Once | INTRAVENOUS | Status: AC
  Administered 2017-09-21: 14:00:00 via INTRAVENOUS

## 2017-09-21 MED ORDER — DIPHENHYDRAMINE HCL 25 MG PO CAPS
50.0000 mg | ORAL_CAPSULE | Freq: Once | ORAL | Status: AC
  Administered 2017-09-21: 50 mg via ORAL

## 2017-09-21 MED ORDER — SODIUM CHLORIDE 0.9 % IV SOLN
Freq: Once | INTRAVENOUS | Status: AC
  Administered 2017-09-21: 16:00:00 via INTRAVENOUS
  Filled 2017-09-21: qty 1000

## 2017-09-21 MED ORDER — DIPHENHYDRAMINE HCL 25 MG PO CAPS
ORAL_CAPSULE | ORAL | Status: AC
  Filled 2017-09-21: qty 2

## 2017-09-21 NOTE — Patient Instructions (Signed)
[facility] Discharge Instructions for Patients Receiving Chemotherapy  Today you received the following chemotherapy agents :  Herceptin, Perjeta.  To help prevent nausea and vomiting after your treatment, we encourage you to take your nausea medication as prescribed.   If you develop nausea and vomiting that is not controlled by your nausea medication, call the clinic.   BELOW ARE SYMPTOMS THAT SHOULD BE REPORTED IMMEDIATELY:  *FEVER GREATER THAN 100.5 F  *CHILLS WITH OR WITHOUT FEVER  NAUSEA AND VOMITING THAT IS NOT CONTROLLED WITH YOUR NAUSEA MEDICATION  *UNUSUAL SHORTNESS OF BREATH  *UNUSUAL BRUISING OR BLEEDING  TENDERNESS IN MOUTH AND THROAT WITH OR WITHOUT PRESENCE OF ULCERS  *URINARY PROBLEMS  *BOWEL PROBLEMS  UNUSUAL RASH Items with * indicate a potential emergency and should be followed up as soon as possible.  Feel free to call the clinic should you have any questions or concerns. The clinic phone number is [phone].  Please show the CHEMO ALERT CARD at check-in to the Emergency Department and triage nurse.   

## 2017-09-24 ENCOUNTER — Other Ambulatory Visit: Payer: Self-pay | Admitting: Hematology and Oncology

## 2017-09-24 DIAGNOSIS — Z170 Estrogen receptor positive status [ER+]: Principal | ICD-10-CM

## 2017-09-24 DIAGNOSIS — C50612 Malignant neoplasm of axillary tail of left female breast: Secondary | ICD-10-CM

## 2017-09-26 ENCOUNTER — Other Ambulatory Visit: Payer: Self-pay | Admitting: Hematology and Oncology

## 2017-09-26 DIAGNOSIS — Z170 Estrogen receptor positive status [ER+]: Principal | ICD-10-CM

## 2017-09-26 DIAGNOSIS — C50612 Malignant neoplasm of axillary tail of left female breast: Secondary | ICD-10-CM

## 2017-10-04 ENCOUNTER — Other Ambulatory Visit: Payer: Self-pay | Admitting: Hematology and Oncology

## 2017-10-07 ENCOUNTER — Encounter: Payer: Self-pay | Admitting: Physical Therapy

## 2017-10-07 ENCOUNTER — Ambulatory Visit: Payer: BLUE CROSS/BLUE SHIELD | Attending: General Surgery | Admitting: Physical Therapy

## 2017-10-07 DIAGNOSIS — R293 Abnormal posture: Secondary | ICD-10-CM | POA: Diagnosis not present

## 2017-10-07 DIAGNOSIS — C773 Secondary and unspecified malignant neoplasm of axilla and upper limb lymph nodes: Secondary | ICD-10-CM | POA: Diagnosis not present

## 2017-10-07 NOTE — Therapy (Signed)
[place], [place], [address] Phone: [phone]   Fax:  [phone]  Physical Therapy Evaluation  Patient Details  Name: Melissa [name] MRN: [id number] Date of Birth: November 10, 1961 Referring Provider: Dr. Rolm Bookbinder  Encounter Date: 10/07/2017      PT End of Session - 10/07/17 1215    Visit Number 1   Number of Visits 2   Date for PT Re-Evaluation 12/02/17   Activity Tolerance Patient tolerated treatment well   Behavior During Therapy [facility] for tasks assessed/performed      Past Medical History:  Diagnosis Date  . Breast cancer ([place]) 04/2017   left breast  . Cervical cancer ([place])    1990, treated with hysterectomy only  . Hypercholesteremia   . Hypertension     Past Surgical History:  Procedure Laterality Date  . ABDOMINAL HYSTERECTOMY  1990  . EYE SURGERY     eye lift  . PORTACATH PLACEMENT Right 05/12/2017   Procedure: INSERTION PORT-A-CATH WITH ULTRASOUND;  Surgeon: Rolm Bookbinder, MD;  Location: [facility];  Service: General;  Laterality: Right;    There were no vitals filed for this visit.       Subjective Assessment - 10/07/17 1111    Subjective Patient is here today today as referred by her surgeon for baseline assessment prior to a left axillary node dissection. She was diagnosed with left breast cancer (ER positive, PR negative, HER2 positive) which is in her axillary nodes (3 abnormal with 1 known positive) but no mass was found in her breast. She underwent neoaduvant chemotherapy which ended 08/31/17. She is scheduled for surgery 10/14/17.   Pertinent History Left breast cancer metastasized to axilla.   Patient Stated Goals Baselines before surgery to prevent lymphedema   Currently in Pain? No/denies            [facility] PT Assessment - 10/07/17 0001      Assessment   Medical Diagnosis Left breast cancer   Referring Provider Dr. Rolm Bookbinder   Onset Date/Surgical Date 04/13/17   Hand Dominance Right   Prior Therapy none     Precautions   Precautions Other (comment)   Precaution Comments active cancer     Restrictions   Weight Bearing Restrictions No     Balance Screen   Has the patient fallen in the past 6 months No   Has the patient had a decrease in activity level because of a fear of falling?  No   Is the patient reluctant to leave their home because of a fear of falling?  No     Home Ecologist residence   Living Arrangements Spouse/significant other   Available Help at Discharge Family     Prior Function   Level of [place] Unemployed   Leisure She does not exercise     Cognition   Overall Cognitive Status Within Functional Limits for tasks assessed     Posture/Postural Control   Posture/Postural Control Postural limitations   Postural Limitations Rounded Shoulders;Forward head     ROM / Strength   AROM / PROM / Strength AROM;Strength     AROM   AROM Assessment Site Shoulder;Cervical   Right/Left Shoulder Right;Left   Right Shoulder Extension 42 Degrees   Right Shoulder Flexion 150 Degrees   Right Shoulder ABduction 172 Degrees   Right Shoulder Internal Rotation 67 Degrees   Right Shoulder External Rotation 87 Degrees  Left Shoulder Extension 37 Degrees   Left Shoulder Flexion 145 Degrees   Left Shoulder ABduction 158 Degrees   Left Shoulder Internal Rotation 49 Degrees   Left Shoulder External Rotation 85 Degrees   Cervical Flexion WNL   Cervical Extension WNL   Cervical - Right Side Bend WNL   Cervical - Left Side Bend WNL   Cervical - Right Rotation WNL   Cervical - Left Rotation WNL     Strength   Overall Strength Comments BUE 5/5           LYMPHEDEMA/ONCOLOGY QUESTIONNAIRE - 10/07/17 1136      Type   Cancer Type Left breast     Lymphedema Assessments   Lymphedema Assessments Upper extremities     Right Upper Extremity  Lymphedema   10 cm Proximal to Olecranon Process 26.7 cm   Olecranon Process 24 cm   10 cm Proximal to Ulnar Styloid Process 20.1 cm   Just Proximal to Ulnar Styloid Process 14.8 cm   Across Hand at [employer] 17.8 cm   At [place] of 2nd Digit 6.8 cm     Left Upper Extremity Lymphedema   10 cm Proximal to Olecranon Process 26 cm   Olecranon Process 23.3 cm   10 cm Proximal to Ulnar Styloid Process 19.5 cm   Just Proximal to Ulnar Styloid Process 14.8 cm   Across Hand at [employer] 17.3 cm   At [place] of 2nd Digit 5.9 cm           Quick Dash - 10/07/17 0001    Open a tight or new jar No difficulty   Do heavy household chores (wash walls, wash floors) No difficulty   Carry a shopping bag or briefcase No difficulty   Wash your back No difficulty   Use a knife to cut food No difficulty   Recreational activities in which you take some force or impact through your arm, shoulder, or hand (golf, hammering, tennis) No difficulty   During the past week, to what extent has your arm, shoulder or hand problem interfered with your normal social activities with family, friends, neighbors, or groups? Not at all   During the past week, to what extent has your arm, shoulder or hand problem limited your work or other regular daily activities Not at all   Arm, shoulder, or hand pain. None   Tingling (pins and needles) in your arm, shoulder, or hand None   Difficulty Sleeping No difficulty   DASH Score 0 %      Objective measurements completed on examination: See above findings.         Patient was instructed today in a home exercise program today for post op shoulder range of motion. These included active assist shoulder flexion in sitting, scapular retraction, wall walking with shoulder abduction, and hands behind head external rotation.  She was encouraged to do these twice a day, holding 3 seconds and repeating 5 times when permitted by her physician.             PT  Education - 10/07/17 1214    Education provided Yes   Education Details Lymphedema risk reduction and post op shoulder ROM HEP   Person(s) Educated Patient   Methods Explanation;Demonstration;Handout   Comprehension Verbalized understanding;Returned demonstration              Breast Clinic Goals - 10/07/17 1218      Patient will be able to verbalize understanding of pertinent  lymphedema risk reduction practices relevant to her diagnosis specifically related to skin care.   Time 1   Period Days   Status Achieved     Patient will be able to return demonstrate and/or verbalize understanding of the post-op home exercise program related to regaining shoulder range of motion.   Time 1   Period Days   Status Achieved     Patient will be able to verbalize understanding of the importance of attending the postoperative After Breast Cancer Class for further lymphedema risk reduction education and therapeutic exercise.   Time 1   Period Days   Status Achieved          Long Term Clinic Goals - 10/07/17 1218      CC Long Term Goal  #1   Title Patient will return to baseline following surgery with shoulder ROM and upper extremity function.   Time 8   Period Weeks   Status New             Plan - 10/07/17 1215    Clinical Impression Statement Patient is here today today as referred by her surgeon for baseline assessment prior to a left axillary node dissection. She was diagnosed with left breast cancer (ER positive, PR negative, HER2 positive) which is in her axillary nodes (3 abnormal with 1 known positive) but no mass was found in her breast. She underwent neoaduvant chemotherapy which ended 08/31/17. She is scheduled for surgery 10/14/17 to have a left axillary lymph node dissection. She has concerns about lymphedema as she will be at high risk and will require radiation to that area. She will benefit from close surveillance of her arm after surgery to reduce the risk of  clinically significant swelling and to assist in getting her shoulder ROM back to baseline.   History and Personal Factors relevant to plan of care: None   Clinical Presentation Stable   Clinical Decision Making Low   Rehab Potential Excellent   Clinical Impairments Affecting Rehab Potential None   PT Frequency --  Eval and 1 f/u visit   PT Treatment/Interventions ADLs/Self Care Home Management;Therapeutic exercise;Patient/family education   PT Next Visit Plan Will reassess on 11/08/17 to determine PT needs   PT Home Exercise Plan Post op shoulder ROM HEP   Consulted and Agree with Plan of Care Patient      Patient will benefit from skilled therapeutic intervention in order to improve the following deficits and impairments:  Decreased range of motion, Impaired UE functional use, Pain, Decreased knowledge of precautions, Postural dysfunction  Visit Diagnosis: 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) - Plan: PT plan of care cert/re-cert  Abnormal posture - Plan: PT plan of care cert/re-cert   Patient will follow up at outpatient cancer rehab 3-4 weeks following surgery.  If the patient requires physical therapy at that time, a specific plan will be dictated and sent to the referring physician for approval. The patient was educated today on appropriate basic range of motion exercises to begin post operatively and the importance of attending the After Breast Cancer class following surgery.  Patient was educated today on lymphedema risk reduction practices as it pertains to recommendations that will benefit the patient immediately following surgery.  She verbalized good understanding.      Problem List Patient Active Problem List   Diagnosis Date Noted  . Port catheter in place 07/20/2017  . 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,  estrogen receptor positive ([place]) 04/30/2017  . Malignant (primary) neoplasm, unspecified  ([place]) 04/26/2017  . 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[place]) 04/21/2017    Annia Friendly, PT 10/07/17 12:20 PM  [place], [place], [address] Phone: [phone]   Fax:  [phone]  Name: Melissa [name] MRN: [id number] Date of Birth: 1961-07-10

## 2017-10-07 NOTE — Patient Instructions (Signed)

## 2017-10-08 ENCOUNTER — Encounter (HOSPITAL_BASED_OUTPATIENT_CLINIC_OR_DEPARTMENT_OTHER): Payer: Self-pay | Admitting: *Deleted

## 2017-10-12 ENCOUNTER — Ambulatory Visit (HOSPITAL_BASED_OUTPATIENT_CLINIC_OR_DEPARTMENT_OTHER): Payer: BLUE CROSS/BLUE SHIELD

## 2017-10-12 ENCOUNTER — Telehealth: Payer: Self-pay | Admitting: Hematology and Oncology

## 2017-10-12 ENCOUNTER — Encounter: Payer: Self-pay | Admitting: *Deleted

## 2017-10-12 ENCOUNTER — Other Ambulatory Visit (HOSPITAL_BASED_OUTPATIENT_CLINIC_OR_DEPARTMENT_OTHER): Payer: BLUE CROSS/BLUE SHIELD

## 2017-10-12 ENCOUNTER — Ambulatory Visit: Payer: BLUE CROSS/BLUE SHIELD

## 2017-10-12 ENCOUNTER — Ambulatory Visit (HOSPITAL_BASED_OUTPATIENT_CLINIC_OR_DEPARTMENT_OTHER): Payer: BLUE CROSS/BLUE SHIELD | Admitting: Hematology and Oncology

## 2017-10-12 DIAGNOSIS — C801 Malignant (primary) neoplasm, unspecified: Secondary | ICD-10-CM

## 2017-10-12 DIAGNOSIS — Z5112 Encounter for antineoplastic immunotherapy: Secondary | ICD-10-CM | POA: Diagnosis not present

## 2017-10-12 DIAGNOSIS — C773 Secondary and unspecified malignant neoplasm of axilla and upper limb lymph nodes: Secondary | ICD-10-CM

## 2017-10-12 DIAGNOSIS — Z170 Estrogen receptor positive status [ER+]: Principal | ICD-10-CM

## 2017-10-12 DIAGNOSIS — C50612 Malignant neoplasm of axillary tail of left female breast: Secondary | ICD-10-CM

## 2017-10-12 DIAGNOSIS — Z95828 Presence of other vascular implants and grafts: Secondary | ICD-10-CM

## 2017-10-12 LAB — CBC WITH DIFFERENTIAL/PLATELET
BASO%: 0.6 % (ref 0.0–2.0)
Basophils Absolute: 0 10*3/uL (ref 0.0–0.1)
EOS%: 1.5 % (ref 0.0–7.0)
Eosinophils Absolute: 0.1 10*3/uL (ref 0.0–0.5)
HEMATOCRIT: 30.6 % — AB (ref 34.8–46.6)
HGB: 10.5 g/dL — ABNORMAL LOW (ref 11.6–15.9)
LYMPH#: 2.1 10*3/uL (ref 0.9–3.3)
LYMPH%: 40 % (ref 14.0–49.7)
MCH: 34.4 pg — ABNORMAL HIGH (ref 25.1–34.0)
MCHC: 34.5 g/dL (ref 31.5–36.0)
MCV: 99.9 fL (ref 79.5–101.0)
MONO#: 0.5 10*3/uL (ref 0.1–0.9)
MONO%: 8.9 % (ref 0.0–14.0)
NEUT#: 2.6 10*3/uL (ref 1.5–6.5)
NEUT%: 49 % (ref 38.4–76.8)
PLATELETS: 294 10*3/uL (ref 145–400)
RBC: 3.06 10*6/uL — ABNORMAL LOW (ref 3.70–5.45)
RDW: 14.8 % — ABNORMAL HIGH (ref 11.2–14.5)
WBC: 5.3 10*3/uL (ref 3.9–10.3)

## 2017-10-12 LAB — COMPREHENSIVE METABOLIC PANEL
ALBUMIN: 3.6 g/dL (ref 3.5–5.0)
ALK PHOS: 60 U/L (ref 40–150)
ALT: 25 U/L (ref 0–55)
AST: 23 U/L (ref 5–34)
Anion Gap: 9 mEq/L (ref 3–11)
BUN: 13.4 mg/dL (ref 7.0–26.0)
CHLORIDE: 102 meq/L (ref 98–109)
CO2: 29 meq/L (ref 22–29)
Calcium: 9.8 mg/dL (ref 8.4–10.4)
Creatinine: 0.9 mg/dL (ref 0.6–1.1)
GLUCOSE: 109 mg/dL (ref 70–140)
POTASSIUM: 3.4 meq/L — AB (ref 3.5–5.1)
SODIUM: 140 meq/L (ref 136–145)
Total Bilirubin: 0.49 mg/dL (ref 0.20–1.20)
Total Protein: 6.8 g/dL (ref 6.4–8.3)

## 2017-10-12 MED ORDER — SODIUM CHLORIDE 0.9 % IV SOLN
Freq: Once | INTRAVENOUS | Status: AC
  Administered 2017-10-12: 12:00:00 via INTRAVENOUS

## 2017-10-12 MED ORDER — SODIUM CHLORIDE 0.9% FLUSH
10.0000 mL | INTRAVENOUS | Status: DC | PRN
  Administered 2017-10-12: 10 mL via INTRAVENOUS
  Filled 2017-10-12: qty 10

## 2017-10-12 MED ORDER — HEPARIN SOD (PORK) LOCK FLUSH 100 UNIT/ML IV SOLN
500.0000 [IU] | Freq: Once | INTRAVENOUS | Status: AC | PRN
  Administered 2017-10-12: 500 [IU]
  Filled 2017-10-12: qty 5

## 2017-10-12 MED ORDER — DIPHENHYDRAMINE HCL 25 MG PO CAPS
50.0000 mg | ORAL_CAPSULE | Freq: Once | ORAL | Status: AC
  Administered 2017-10-12: 50 mg via ORAL

## 2017-10-12 MED ORDER — TRASTUZUMAB CHEMO 150 MG IV SOLR
450.0000 mg | Freq: Once | INTRAVENOUS | Status: AC
  Administered 2017-10-12: 450 mg via INTRAVENOUS
  Filled 2017-10-12: qty 21.43

## 2017-10-12 MED ORDER — PERTUZUMAB CHEMO INJECTION 420 MG/14ML
420.0000 mg | Freq: Once | INTRAVENOUS | Status: AC
  Administered 2017-10-12: 420 mg via INTRAVENOUS
  Filled 2017-10-12: qty 14

## 2017-10-12 MED ORDER — DIPHENHYDRAMINE HCL 25 MG PO CAPS
ORAL_CAPSULE | ORAL | Status: AC
Start: 2017-10-12 — End: 2017-10-12
  Filled 2017-10-12: qty 2

## 2017-10-12 MED ORDER — SODIUM CHLORIDE 0.9% FLUSH
10.0000 mL | INTRAVENOUS | Status: DC | PRN
  Administered 2017-10-12: 10 mL
  Filled 2017-10-12: qty 10

## 2017-10-12 MED ORDER — ACETAMINOPHEN 325 MG PO TABS
650.0000 mg | ORAL_TABLET | Freq: Once | ORAL | Status: AC
  Administered 2017-10-12: 650 mg via ORAL

## 2017-10-12 MED ORDER — ACETAMINOPHEN 325 MG PO TABS
ORAL_TABLET | ORAL | Status: AC
  Filled 2017-10-12: qty 2

## 2017-10-12 MED ORDER — ZOLPIDEM TARTRATE 5 MG PO TABS: 5.0000 mg | ORAL_TABLET | Freq: Every evening | ORAL | 3 refills | Status: DC | PRN

## 2017-10-12 NOTE — Assessment & Plan Note (Addendum)
04/14/2017 Left axillary lymph node biopsy: Metastatic carcinoma positive for CK 7, ER positive,GATA-3 equivocal, negative for CK 20,GCDFP, CDX-2, TTF-1; HER-2 positive ratio 2.08 mammogram and ultrasound revealed 3 discrete abnormal left axillary lymph nodes largest measures 1.8 cm Clinical stage: TX N1MX  Treatment plan: 1. Neoadjuvant TCH Perjeta 6 cycles completed 08/31/2017 followed by Herceptin, Perjeta maintenance for 1 year 2. followed by axillary lymph node dissection. Breast surgery will not be performed because there is no primary tumor identifiable on breast MRI 3. Followed by radiation 4. Followed by antiestrogen therapy ------------------------------------------------------------------------------------------------------------------ Current Treatment:Herceptin Perjeta maintenance therapy for 1 year Patient to undergo surgery with axillary dissection. Return to clinic after surgery to discuss results  Continue with maintenance therapy with Herceptin and Perjeta every 3 weeks

## 2017-10-12 NOTE — Progress Notes (Signed)
Pt tolerated Perjeta infusion without difficulty. Pt declined to stay for full 30 minute observation period. Pt states she has not had any issues with her treatments. Pt left ambulatory in no apparent distress. Pt AAOX3. Respirations equal and unlabored. Pt aware to call with any concerns or questions and verbalized understanding.

## 2017-10-12 NOTE — Telephone Encounter (Signed)
Scheduled appt per 11/6 los. Patient did not want avs or calendar with appts.

## 2017-10-12 NOTE — Progress Notes (Signed)
Patient Care Team: Deland Pretty, MD as PCP - General (Internal Medicine)  DIAGNOSIS:  Encounter Diagnosis  Name Primary?  . 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[place])   04/14/2017 Initial Diagnosis    Left axillary lymph node biopsy: Metastatic carcinoma positive for CK 7, ER positive,GATA-3 equivocal, negative for CK 20,GCDFP, CDX-2, TTF-1; HER-2 positive ratio 2.08; mammogram and ultrasound revealed 3 discrete abnormal left axillary lymph nodes largest measures 1.8 cm      04/23/2017 Breast MRI    Left axillary lymphadenopathy numerous enlarged level I axillary lymph nodes largest 2.1 cm, no other abdominal masses in the left breast itself, no MRI evidence of malignancy in the right breast      04/26/2017 Imaging    CT CAP: Prominent left axillary lymph nodes no distant metastases, sclerotic focus left pedicle of T9 vertebra favored benign, T5 vertebral hemangioma      05/12/2017 -  Neo-Adjuvant Chemotherapy    TCH Perjeta       CHIEF COMPLIANT: Herceptin and Perjeta maintenance  INTERVAL HISTORY: Melissa [name] is a 56 year old with above-mentioned history of left breast lymph node involvement with metastatic carcinoma who received [place] Perjeta for 6 cycles and is now on Herceptin and Perjeta maintenance.  This Thursday she is planning to undergo axillary lymph node dissection.  She has had no abnormalities noted in the breast.  She reports to be feeling a lot better.  Energy levels taste and appetite have returned.  She does not have diarrhea anymore.  Last diarrhea was 1-2 days after the Herceptin Perjeta after that it went away.  REVIEW OF SYSTEMS:   Constitutional: Denies fevers, chills or abnormal weight loss Eyes: Denies blurriness of vision Ears, nose, mouth, throat, and face: Denies mucositis or sore  throat Respiratory: Denies cough, dyspnea or wheezes Cardiovascular: Denies palpitation, chest discomfort Gastrointestinal:  Denies nausea, heartburn or change in bowel habits Skin: Denies abnormal skin rashes Lymphatics: Denies new lymphadenopathy or easy bruising Neurological:Denies numbness, tingling or new weaknesses Behavioral/Psych: Mood is stable, no new changes  Extremities: No lower extremity edema  All other systems were reviewed with the patient and are negative.  I have reviewed the past medical history, past surgical history, social history and family history with the patient and they are unchanged from previous note.  ALLERGIES:  has No Known Allergies.  MEDICATIONS:  Current Outpatient Medications  Medication Sig Dispense Refill  . ALPRAZolam (XANAX) 0.5 MG tablet Take 0.5 mg by mouth 3 (three) times daily.  0  . calcium carbonate (OS-CAL) 1250 (500 Ca) MG chewable tablet Chew 1 tablet by mouth daily. Calcium 600 mg    . cholecalciferol (VITAMIN D) 1000 units tablet Take 1,000 Units by mouth daily. 2000 iu    . dexamethasone (DECADRON) 4 MG tablet Take 1 tablet (4 mg total) by mouth 2 (two) times daily. Take 1 tab day before chemo and 1 tab day after chemo. (Patient not taking: Reported on 10/07/2017) 30 tablet 1  . diphenoxylate-atropine (LOMOTIL) 2.5-0.025 MG tablet Take 1 tablet by mouth 4 (four) times daily as needed for diarrhea or loose stools. 30 tablet 1  . lidocaine-prilocaine (EMLA) cream Apply to affected area once 30 g 3  . LORazepam (ATIVAN) 0.5 MG tablet TAKE TWO TABLETS BY MOUTH AT BEDTIME 60 tablet 0  . ondansetron (  ZOFRAN) 8 MG tablet Take 1 tablet (8 mg total) by mouth 2 (two) times daily as needed for refractory nausea / vomiting. Start on day 3 after chemo. 30 tablet 1  . pantoprazole (PROTONIX) 40 MG tablet TAKE ONE TABLET BY MOUTH DAILY 30 tablet 0  . potassium chloride SA (K-DUR,KLOR-CON) 20 MEQ tablet Take 1 tablet (20 mEq total) by mouth daily. 30  tablet 0  . rosuvastatin (CRESTOR) 10 MG tablet   0  . triamterene-hydrochlorothiazide (MAXZIDE-25) 37.5-25 MG tablet Take 1 tablet by mouth daily.    . valACYclovir (VALTREX) 1000 MG tablet take 2 tablets by mouth every 12 hours if needed for 10 days  0   No current facility-administered medications for this visit.    Facility-Administered Medications Ordered in Other Visits  Medication Dose Route Frequency Provider Last Rate Last Dose  . sodium chloride flush (NS) 0.9 % injection 10 mL  10 mL Intracatheter PRN Nicholas Lose, MD   10 mL at 08/31/17 1457    PHYSICAL EXAMINATION: ECOG PERFORMANCE STATUS: 1 - Symptomatic but completely ambulatory  Vitals:   10/12/17 1038  BP: 128/69  Pulse: 81  Resp: 20  Temp: 98.4 F (36.9 C)  SpO2: 99%   Filed Weights   10/12/17 1038  Weight: 147 lb 8 oz (66.9 kg)    GENERAL:alert, no distress and comfortable SKIN: skin color, texture, turgor are normal, no rashes or significant lesions EYES: normal, Conjunctiva are pink and non-injected, sclera clear OROPHARYNX:no exudate, no erythema and lips, buccal mucosa, and tongue normal  NECK: supple, thyroid normal size, non-tender, without nodularity LYMPH:  no palpable lymphadenopathy in the cervical, axillary or inguinal LUNGS: clear to auscultation and percussion with normal breathing effort HEART: regular rate & rhythm and no murmurs and no lower extremity edema ABDOMEN:abdomen soft, non-tender and normal bowel sounds MUSCULOSKELETAL:no cyanosis of digits and no clubbing  NEURO: alert & oriented x 3 with fluent speech, no focal motor/sensory deficits EXTREMITIES: No lower extremity edema  LABORATORY DATA:  I have reviewed the data as listed   Chemistry      Component Value Date/Time   NA 140 10/12/2017 0958   K 3.4 (L) 10/12/2017 0958   CL 91 (L) 08/15/2017 1654   CO2 29 10/12/2017 0958   BUN 13.4 10/12/2017 0958   CREATININE 0.9 10/12/2017 0958      Component Value Date/Time    CALCIUM 9.8 10/12/2017 0958   ALKPHOS 60 10/12/2017 0958   AST 23 10/12/2017 0958   ALT 25 10/12/2017 0958   BILITOT 0.49 10/12/2017 0958       Lab Results  Component Value Date   WBC 5.3 10/12/2017   HGB 10.5 (L) 10/12/2017   HCT 30.6 (L) 10/12/2017   MCV 99.9 10/12/2017   PLT 294 10/12/2017   NEUTROABS 2.6 10/12/2017    ASSESSMENT & PLAN:  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 Neoadjuvant TCH Perjeta 6 cycles completed 08/31/2017 followed by Herceptin, Perjeta maintenance for 1 year 2. followed by axillary lymph node dissection. Breast surgery will not be performed because there is no primary tumor identifiable on breast MRI 3. Followed by radiation 4. Followed by antiestrogen therapy ------------------------------------------------------------------------------------------------------------------  Current Treatment:Herceptin Perjeta maintenance therapy for 1 year Patient to undergo surgery with axillary dissection. Return to clinic after surgery to discuss results  Continue with maintenance therapy with Herceptin and Perjeta every 3 weeks Patient was interested in finding a risk of recurrence.  We will discuss these issues after looking at the final pathology report.  I spent 25 minutes talking to the patient of which more than half was spent in counseling and coordination of care.  No orders of the defined types were placed in this encounter.  The patient has a good understanding of the overall plan. she agrees with it. she will call with any problems that may develop before the next visit  here.   Rulon Eisenmenger, MD 10/12/17

## 2017-10-12 NOTE — Progress Notes (Addendum)
Ensure pre surgery drink given with instructions to complete by 0400am dos, hibiclens soap given with instructions, pt verbalized understanding.

## 2017-10-12 NOTE — Patient Instructions (Signed)
[facility] Discharge Instructions for Patients Receiving Chemotherapy  Today you received the following chemotherapy agents :  Herceptin, Perjeta.  To help prevent nausea and vomiting after your treatment, we encourage you to take your nausea medication as prescribed.   If you develop nausea and vomiting that is not controlled by your nausea medication, call the clinic.   BELOW ARE SYMPTOMS THAT SHOULD BE REPORTED IMMEDIATELY:  *FEVER GREATER THAN 100.5 F  *CHILLS WITH OR WITHOUT FEVER  NAUSEA AND VOMITING THAT IS NOT CONTROLLED WITH YOUR NAUSEA MEDICATION  *UNUSUAL SHORTNESS OF BREATH  *UNUSUAL BRUISING OR BLEEDING  TENDERNESS IN MOUTH AND THROAT WITH OR WITHOUT PRESENCE OF ULCERS  *URINARY PROBLEMS  *BOWEL PROBLEMS  UNUSUAL RASH Items with * indicate a potential emergency and should be followed up as soon as possible.  Feel free to call the clinic should you have any questions or concerns. The clinic phone number is [phone].  Please show the CHEMO ALERT CARD at check-in to the Emergency Department and triage nurse.   

## 2017-10-14 ENCOUNTER — Ambulatory Visit (HOSPITAL_BASED_OUTPATIENT_CLINIC_OR_DEPARTMENT_OTHER)
Admission: RE | Admit: 2017-10-14 | Discharge: 2017-10-15 | Disposition: A | Discharge status: Home / Self Care | Payer: BLUE CROSS/BLUE SHIELD | Source: Ambulatory Visit | Attending: General Surgery | Admitting: General Surgery

## 2017-10-14 ENCOUNTER — Ambulatory Visit (HOSPITAL_BASED_OUTPATIENT_CLINIC_OR_DEPARTMENT_OTHER): Payer: BLUE CROSS/BLUE SHIELD | Admitting: Anesthesiology

## 2017-10-14 ENCOUNTER — Encounter (HOSPITAL_BASED_OUTPATIENT_CLINIC_OR_DEPARTMENT_OTHER)
Admission: RE | Disposition: A | Discharge status: Home / Self Care | Payer: Self-pay | Source: Ambulatory Visit | Attending: General Surgery

## 2017-10-14 ENCOUNTER — Encounter (HOSPITAL_BASED_OUTPATIENT_CLINIC_OR_DEPARTMENT_OTHER): Payer: Self-pay | Admitting: Anesthesiology

## 2017-10-14 ENCOUNTER — Other Ambulatory Visit: Payer: Self-pay

## 2017-10-14 DIAGNOSIS — Z90710 Acquired absence of both cervix and uterus: Secondary | ICD-10-CM | POA: Insufficient documentation

## 2017-10-14 DIAGNOSIS — K219 Gastro-esophageal reflux disease without esophagitis: Secondary | ICD-10-CM | POA: Insufficient documentation

## 2017-10-14 DIAGNOSIS — C50612 Malignant neoplasm of axillary tail of left female breast: Secondary | ICD-10-CM | POA: Diagnosis not present

## 2017-10-14 DIAGNOSIS — Z8541 Personal history of malignant neoplasm of cervix uteri: Secondary | ICD-10-CM | POA: Diagnosis not present

## 2017-10-14 DIAGNOSIS — E7800 Pure hypercholesterolemia, unspecified: Secondary | ICD-10-CM | POA: Insufficient documentation

## 2017-10-14 DIAGNOSIS — I10 Essential (primary) hypertension: Secondary | ICD-10-CM | POA: Diagnosis not present

## 2017-10-14 DIAGNOSIS — C50912 Malignant neoplasm of unspecified site of left female breast: Secondary | ICD-10-CM | POA: Diagnosis not present

## 2017-10-14 DIAGNOSIS — Z79899 Other long term (current) drug therapy: Secondary | ICD-10-CM | POA: Insufficient documentation

## 2017-10-14 DIAGNOSIS — G8918 Other acute postprocedural pain: Secondary | ICD-10-CM | POA: Diagnosis not present

## 2017-10-14 HISTORY — DX: Gastro-esophageal reflux disease without esophagitis: K21.9

## 2017-10-14 HISTORY — PX: AXILLARY LYMPH NODE DISSECTION: SHX5229

## 2017-10-14 SURGERY — LYMPHADENECTOMY, AXILLARY
Anesthesia: General | Site: Axilla | Laterality: Left

## 2017-10-14 MED ORDER — ACETAMINOPHEN 500 MG PO TABS
1000.0000 mg | ORAL_TABLET | ORAL | Status: AC
  Administered 2017-10-14: 1000 mg via ORAL

## 2017-10-14 MED ORDER — DEXAMETHASONE SODIUM PHOSPHATE 10 MG/ML IJ SOLN
INTRAMUSCULAR | Status: AC
Start: 2017-10-14 — End: 2017-10-14
  Filled 2017-10-14: qty 1

## 2017-10-14 MED ORDER — CELECOXIB 200 MG PO CAPS
200.0000 mg | ORAL_CAPSULE | ORAL | Status: AC
  Administered 2017-10-14: 200 mg via ORAL

## 2017-10-14 MED ORDER — ACETAMINOPHEN 500 MG PO TABS
1000.0000 mg | ORAL_TABLET | Freq: Four times a day (QID) | ORAL | Status: DC
  Administered 2017-10-14: 500 mg via ORAL
  Administered 2017-10-15: 1000 mg via ORAL
  Filled 2017-10-14 (×2): qty 2

## 2017-10-14 MED ORDER — BUPIVACAINE-EPINEPHRINE (PF) 0.5% -1:200000 IJ SOLN
INTRAMUSCULAR | Status: DC | PRN
  Administered 2017-10-14: 30 mL via PERINEURAL

## 2017-10-14 MED ORDER — GABAPENTIN 300 MG PO CAPS
ORAL_CAPSULE | ORAL | Status: AC
  Filled 2017-10-14: qty 1

## 2017-10-14 MED ORDER — LIDOCAINE HCL (CARDIAC) 20 MG/ML IV SOLN
INTRAVENOUS | Status: DC | PRN
  Administered 2017-10-14: 80 mg via INTRAVENOUS

## 2017-10-14 MED ORDER — SODIUM CHLORIDE 0.9 % IV SOLN
INTRAVENOUS | Status: DC
  Administered 2017-10-14 – 2017-10-15 (×2): via INTRAVENOUS

## 2017-10-14 MED ORDER — MORPHINE SULFATE (PF) 4 MG/ML IV SOLN
INTRAVENOUS | Status: AC
  Filled 2017-10-14: qty 1

## 2017-10-14 MED ORDER — FENTANYL CITRATE (PF) 100 MCG/2ML IJ SOLN: 25.0000 ug | INTRAMUSCULAR | Status: DC | PRN

## 2017-10-14 MED ORDER — PROMETHAZINE HCL 25 MG/ML IJ SOLN: 6.2500 mg | INTRAMUSCULAR | Status: DC | PRN

## 2017-10-14 MED ORDER — PROPOFOL 10 MG/ML IV BOLUS
INTRAVENOUS | Status: DC | PRN
  Administered 2017-10-14: 150 mg via INTRAVENOUS

## 2017-10-14 MED ORDER — GABAPENTIN 300 MG PO CAPS
300.0000 mg | ORAL_CAPSULE | ORAL | Status: AC
  Administered 2017-10-14: 300 mg via ORAL

## 2017-10-14 MED ORDER — MORPHINE SULFATE (PF) 2 MG/ML IV SOLN
2.0000 mg | INTRAVENOUS | Status: DC | PRN
  Administered 2017-10-14 (×2): 2 mg via INTRAVENOUS

## 2017-10-14 MED ORDER — EPHEDRINE SULFATE 50 MG/ML IJ SOLN
INTRAMUSCULAR | Status: DC | PRN
  Administered 2017-10-14: 10 mg via INTRAVENOUS

## 2017-10-14 MED ORDER — CEFAZOLIN SODIUM-DEXTROSE 2-4 GM/100ML-% IV SOLN
INTRAVENOUS | Status: AC
  Filled 2017-10-14: qty 100

## 2017-10-14 MED ORDER — ONDANSETRON HCL 4 MG/2ML IJ SOLN: 4.0000 mg | Freq: Four times a day (QID) | INTRAMUSCULAR | Status: DC | PRN

## 2017-10-14 MED ORDER — METHOCARBAMOL 500 MG PO TABS
500.0000 mg | ORAL_TABLET | Freq: Four times a day (QID) | ORAL | Status: DC | PRN
  Administered 2017-10-14 – 2017-10-15 (×4): 500 mg via ORAL
  Filled 2017-10-14 (×4): qty 1

## 2017-10-14 MED ORDER — OXYCODONE HCL 5 MG PO TABS
5.0000 mg | ORAL_TABLET | ORAL | Status: DC | PRN
  Administered 2017-10-14 – 2017-10-15 (×4): 5 mg via ORAL
  Filled 2017-10-14 (×4): qty 1

## 2017-10-14 MED ORDER — CELECOXIB 200 MG PO CAPS
ORAL_CAPSULE | ORAL | Status: AC
  Filled 2017-10-14: qty 1

## 2017-10-14 MED ORDER — BUPIVACAINE HCL (PF) 0.25 % IJ SOLN
INTRAMUSCULAR | Status: AC
  Filled 2017-10-14: qty 210

## 2017-10-14 MED ORDER — FENTANYL CITRATE (PF) 100 MCG/2ML IJ SOLN
INTRAMUSCULAR | Status: AC
  Filled 2017-10-14: qty 2

## 2017-10-14 MED ORDER — ONDANSETRON 4 MG PO TBDP: 4.0000 mg | ORAL_TABLET | Freq: Four times a day (QID) | ORAL | Status: DC | PRN

## 2017-10-14 MED ORDER — TRIAMTERENE-HCTZ 37.5-25 MG PO TABS: 1.0000 | ORAL_TABLET | Freq: Every day | ORAL | Status: DC

## 2017-10-14 MED ORDER — ONDANSETRON HCL 4 MG/2ML IJ SOLN
INTRAMUSCULAR | Status: AC
  Filled 2017-10-14: qty 2

## 2017-10-14 MED ORDER — PROPOFOL 500 MG/50ML IV EMUL
INTRAVENOUS | Status: AC
  Filled 2017-10-14: qty 50

## 2017-10-14 MED ORDER — BUPIVACAINE HCL (PF) 0.25 % IJ SOLN
INTRAMUSCULAR | Status: DC | PRN
  Administered 2017-10-14: 5 mL

## 2017-10-14 MED ORDER — MIDAZOLAM HCL 2 MG/2ML IJ SOLN
INTRAMUSCULAR | Status: AC
  Filled 2017-10-14: qty 2

## 2017-10-14 MED ORDER — DOCUSATE SODIUM 100 MG PO CAPS
100.0000 mg | ORAL_CAPSULE | Freq: Two times a day (BID) | ORAL | Status: DC | PRN
  Administered 2017-10-14: 100 mg via ORAL
  Filled 2017-10-14: qty 1

## 2017-10-14 MED ORDER — ACETAMINOPHEN 500 MG PO TABS
ORAL_TABLET | ORAL | Status: AC
  Filled 2017-10-14: qty 2

## 2017-10-14 MED ORDER — SENNA 8.6 MG PO TABS: 1.0000 | ORAL_TABLET | Freq: Two times a day (BID) | ORAL | Status: DC | PRN

## 2017-10-14 MED ORDER — ALPRAZOLAM 0.5 MG PO TABS
0.5000 mg | ORAL_TABLET | Freq: Three times a day (TID) | ORAL | Status: DC | PRN
  Administered 2017-10-14: 0.5 mg via ORAL
  Filled 2017-10-14: qty 2

## 2017-10-14 MED ORDER — EPHEDRINE 5 MG/ML INJ
INTRAVENOUS | Status: AC
  Filled 2017-10-14: qty 10

## 2017-10-14 MED ORDER — PROPOFOL 500 MG/50ML IV EMUL
INTRAVENOUS | Status: AC
Start: 2017-10-14 — End: 2017-10-14
  Filled 2017-10-14: qty 50

## 2017-10-14 MED ORDER — SIMETHICONE 80 MG PO CHEW: 40.0000 mg | CHEWABLE_TABLET | Freq: Four times a day (QID) | ORAL | Status: DC | PRN

## 2017-10-14 MED ORDER — ONDANSETRON HCL 4 MG/2ML IJ SOLN
INTRAMUSCULAR | Status: DC | PRN
  Administered 2017-10-14: 4 mg via INTRAVENOUS

## 2017-10-14 MED ORDER — FENTANYL CITRATE (PF) 100 MCG/2ML IJ SOLN
50.0000 ug | INTRAMUSCULAR | Status: AC | PRN
  Administered 2017-10-14 (×3): 50 ug via INTRAVENOUS

## 2017-10-14 MED ORDER — LACTATED RINGERS IV SOLN
INTRAVENOUS | Status: DC
  Administered 2017-10-14: 07:00:00 via INTRAVENOUS

## 2017-10-14 MED ORDER — PANTOPRAZOLE SODIUM 40 MG PO TBEC: 40.0000 mg | DELAYED_RELEASE_TABLET | Freq: Every day | ORAL | Status: DC

## 2017-10-14 MED ORDER — SCOPOLAMINE 1 MG/3DAYS TD PT72: 1.0000 | MEDICATED_PATCH | Freq: Once | TRANSDERMAL | Status: DC | PRN

## 2017-10-14 MED ORDER — CEFAZOLIN SODIUM-DEXTROSE 2-4 GM/100ML-% IV SOLN
2.0000 g | INTRAVENOUS | Status: AC
  Administered 2017-10-14: 2 g via INTRAVENOUS

## 2017-10-14 MED ORDER — MIDAZOLAM HCL 2 MG/2ML IJ SOLN
1.0000 mg | INTRAMUSCULAR | Status: DC | PRN
  Administered 2017-10-14: 2 mg via INTRAVENOUS

## 2017-10-14 MED ORDER — LIDOCAINE 2% (20 MG/ML) 5 ML SYRINGE
INTRAMUSCULAR | Status: AC
  Filled 2017-10-14: qty 5

## 2017-10-14 MED ORDER — DEXAMETHASONE SODIUM PHOSPHATE 4 MG/ML IJ SOLN
INTRAMUSCULAR | Status: DC | PRN
  Administered 2017-10-14: 10 mg via INTRAVENOUS

## 2017-10-14 SURGICAL SUPPLY — 54 items
APPLIER CLIP 9.375 MED OPEN (MISCELLANEOUS) ×6
BENZOIN TINCTURE PRP APPL 2/3 (GAUZE/BANDAGES/DRESSINGS) IMPLANT
BIOPATCH RED 1 DISK 7.0 (GAUZE/BANDAGES/DRESSINGS) ×2 IMPLANT
BIOPATCH RED 1IN DISK 7.0MM (GAUZE/BANDAGES/DRESSINGS) ×1
BLADE SURG 15 STRL LF DISP TIS (BLADE) ×1 IMPLANT
BLADE SURG 15 STRL SS (BLADE) ×2
CANISTER SUCT 1200ML W/VALVE (MISCELLANEOUS) ×3 IMPLANT
CHLORAPREP W/TINT 26ML (MISCELLANEOUS) ×3 IMPLANT
CLIP APPLIE 9.375 MED OPEN (MISCELLANEOUS) ×2 IMPLANT
CLIP VESOCCLUDE SM WIDE 6/CT (CLIP) IMPLANT
CLOSURE WOUND 1/2 X4 (GAUZE/BANDAGES/DRESSINGS) ×1
COVER BACK TABLE 60X90IN (DRAPES) ×3 IMPLANT
COVER MAYO STAND STRL (DRAPES) ×3 IMPLANT
DERMABOND ADVANCED (GAUZE/BANDAGES/DRESSINGS) ×2
DERMABOND ADVANCED .7 DNX12 (GAUZE/BANDAGES/DRESSINGS) ×1 IMPLANT
DRAIN CHANNEL 19F RND (DRAIN) ×3 IMPLANT
DRAPE LAPAROSCOPIC ABDOMINAL (DRAPES) ×3 IMPLANT
DRSG TEGADERM 4X4.75 (GAUZE/BANDAGES/DRESSINGS) IMPLANT
ELECT COATED BLADE 2.86 ST (ELECTRODE) ×3 IMPLANT
ELECT REM PT RETURN 9FT ADLT (ELECTROSURGICAL) ×3
ELECTRODE REM PT RTRN 9FT ADLT (ELECTROSURGICAL) ×1 IMPLANT
EVACUATOR SILICONE 100CC (DRAIN) ×3 IMPLANT
GAUZE SPONGE 4X4 12PLY STRL LF (GAUZE/BANDAGES/DRESSINGS) IMPLANT
GLOVE BIO SURGEON STRL SZ7 (GLOVE) ×3 IMPLANT
GLOVE BIOGEL PI IND STRL 7.5 (GLOVE) ×1 IMPLANT
GLOVE BIOGEL PI INDICATOR 7.5 (GLOVE) ×2
GOWN STRL REUS W/ TWL LRG LVL3 (GOWN DISPOSABLE) ×2 IMPLANT
GOWN STRL REUS W/TWL LRG LVL3 (GOWN DISPOSABLE) ×4
HEMOSTAT ARISTA ABSORB 3G PWDR (MISCELLANEOUS) ×3 IMPLANT
LIGHT WAVEGUIDE WIDE FLAT (MISCELLANEOUS) ×3 IMPLANT
NEEDLE HYPO 25X1 1.5 SAFETY (NEEDLE) ×3 IMPLANT
NS IRRIG 1000ML POUR BTL (IV SOLUTION) ×3 IMPLANT
PACK BASIN DAY SURGERY FS (CUSTOM PROCEDURE TRAY) ×3 IMPLANT
PENCIL BUTTON HOLSTER BLD 10FT (ELECTRODE) ×3 IMPLANT
PIN SAFETY STERILE (MISCELLANEOUS) ×3 IMPLANT
SLEEVE SCD COMPRESS KNEE MED (MISCELLANEOUS) ×3 IMPLANT
SPONGE LAP 4X18 X RAY DECT (DISPOSABLE) ×6 IMPLANT
STRIP CLOSURE SKIN 1/2X4 (GAUZE/BANDAGES/DRESSINGS) ×2 IMPLANT
SUT ETHILON 2 0 FS 18 (SUTURE) ×3 IMPLANT
SUT MNCRL AB 4-0 PS2 18 (SUTURE) ×3 IMPLANT
SUT SILK 2 0 SH (SUTURE) IMPLANT
SUT VIC AB 2-0 SH 27 (SUTURE) ×2
SUT VIC AB 2-0 SH 27XBRD (SUTURE) ×1 IMPLANT
SUT VIC AB 3-0 SH 27 (SUTURE) ×2
SUT VIC AB 3-0 SH 27X BRD (SUTURE) ×1 IMPLANT
SUT VICRYL AB 2 0 TIE (SUTURE) IMPLANT
SUT VICRYL AB 2 0 TIES (SUTURE)
SUT VICRYL AB 3 0 TIES (SUTURE) IMPLANT
SYR CONTROL 10ML LL (SYRINGE) ×3 IMPLANT
TOWEL OR 17X24 6PK STRL BLUE (TOWEL DISPOSABLE) ×3 IMPLANT
TOWEL OR NON WOVEN STRL DISP B (DISPOSABLE) ×3 IMPLANT
TUBE CONNECTING 20'X1/4 (TUBING) ×1
TUBE CONNECTING 20X1/4 (TUBING) ×2 IMPLANT
YANKAUER SUCT BULB TIP NO VENT (SUCTIONS) ×3 IMPLANT

## 2017-10-14 NOTE — H&P (Signed)
Melissa [name] is an 56 y.o. female.   Chief Complaint: breast cancer HPI:  17 yof with occult breast cancer and axillary mets upon diagnosis earlier this year. she has no real prior history of breast disease. she has fh in maternal aunt only of breast cancer, no ovarian cancer. she is here with her husband and her daughter today. she underwent routine screening mm that showed left axillary adenopathy. she did not note a mass or dc at that time. she has c density breasts. her dx mm and US showed no abnormality in either breast. on [place] she has 3 discrete abnl left axillary nodes. US guided biopsy of nodes are consistent with metastatic carcinoma strongly favor a breast primary. the prog panel is er pos, her 2 positive. she udnerwent mri that showed: 1. Left axillary lymphadenopathy with numerous enlarged left level 1 axillary lymph nodes identified, the largest of which in the low left axilla containing biopsy clip artifact measures up to 2.1 cm. 2. No abnormal masses or abnormal areas of enhancement in the left breast to suggest a primary source of malignancy. 3. No MRI evidence of malignancy in the right breast. she has now undergone tchp for 6 cycles. her repeat mri is: Lymph nodes: There is interval improvement in the left axillary level 1 adenopathy. The largest lymph node measures 0.9 x 0.8 x 1.2 cm. On the prior MRI dated 04/22/2017 it measured 1.8 x 1.6 x 2.1 cm. Ancillary findings: None. IMPRESSION: Interval improvement in the left axillary adenopathy. she has done well with chemo and now presents finished and ready to discuss surgery   Past Medical History:  Diagnosis Date  . Breast cancer ([place]) 04/2017   left breast  . Cervical cancer ([place])    1990, treated with hysterectomy only  . GERD (gastroesophageal reflux disease)   . Hypercholesteremia   . Hypertension     Past Surgical History:  Procedure Laterality Date  . ABDOMINAL HYSTERECTOMY  1990  . EYE SURGERY      eye lift    History reviewed. No pertinent family history. Social History:  reports that  has never smoked. she has never used smokeless tobacco. She reports that she drinks alcohol. She reports that she does not use drugs.  Allergies: No Known Allergies  Medications Prior to Admission  Medication Sig Dispense Refill  . ALPRAZolam (XANAX) 0.5 MG tablet Take 0.5 mg by mouth 3 (three) times daily.  0  . calcium carbonate (OS-CAL) 1250 (500 Ca) MG chewable tablet Chew 1 tablet by mouth daily. Calcium 600 mg    . cholecalciferol (VITAMIN D) 1000 units tablet Take 1,000 Units by mouth daily. 2000 iu    . lidocaine-prilocaine (EMLA) cream Apply to affected area once 30 g 3  . ondansetron (ZOFRAN) 8 MG tablet Take 1 tablet (8 mg total) by mouth 2 (two) times daily as needed for refractory nausea / vomiting. Start on day 3 after chemo. 30 tablet 1  . pantoprazole (PROTONIX) 40 MG tablet TAKE ONE TABLET BY MOUTH DAILY 30 tablet 0  . rosuvastatin (CRESTOR) 10 MG tablet   0  . triamterene-hydrochlorothiazide (MAXZIDE-25) 37.5-25 MG tablet Take 1 tablet by mouth daily.    . valACYclovir (VALTREX) 1000 MG tablet take 2 tablets by mouth every 12 hours if needed for 10 days  0  . zolpidem (AMBIEN) 5 MG tablet Take 1 tablet (5 mg total) at bedtime as needed by mouth for sleep. 30 tablet 3  . diphenoxylate-atropine (LOMOTIL) 2.5-0.025  MG tablet Take 1 tablet by mouth 4 (four) times daily as needed for diarrhea or loose stools. 30 tablet 1    Results for orders placed or performed in visit on 10/12/17 (from the past 48 hour(s))  Comprehensive metabolic panel     Status: Abnormal   Collection Time: 10/12/17  9:58 AM  Result Value Ref Range   Sodium 140 136 - 145 mEq/L   Potassium 3.4 (L) 3.5 - 5.1 mEq/L   Chloride 102 98 - 109 mEq/L   CO2 29 22 - 29 mEq/L   Glucose 109 70 - 140 mg/dl    Comment: Glucose reference range is for nonfasting patients. Fasting glucose reference range is 70- 100.   BUN  13.4 7.0 - 26.0 mg/dL   Creatinine 0.9 0.6 - 1.1 mg/dL   Total Bilirubin 0.49 0.20 - 1.20 mg/dL   Alkaline Phosphatase 60 40 - 150 U/L   AST 23 5 - 34 U/L   ALT 25 0 - 55 U/L   Total Protein 6.8 6.4 - 8.3 g/dL   Albumin 3.6 3.5 - 5.0 g/dL   Calcium 9.8 8.4 - 10.4 mg/dL   Anion Gap 9 3 - 11 mEq/L   EGFR >60 >60 ml/min/1.73 m2    Comment: eGFR is calculated using the CKD-EPI Creatinine Equation (2009)  CBC with Differential     Status: Abnormal   Collection Time: 10/12/17  9:58 AM  Result Value Ref Range   WBC 5.3 3.9 - 10.3 10e3/uL   NEUT# 2.6 1.5 - 6.5 10e3/uL   HGB 10.5 (L) 11.6 - 15.9 g/dL   HCT 30.6 (L) 34.8 - 46.6 %   Platelets 294 145 - 400 10e3/uL   MCV 99.9 79.5 - 101.0 fL   MCH 34.4 (H) 25.1 - 34.0 pg   MCHC 34.5 31.5 - 36.0 g/dL   RBC 3.06 (L) 3.70 - 5.45 10e6/uL   RDW 14.8 (H) 11.2 - 14.5 %   lymph# 2.1 0.9 - 3.3 10e3/uL   MONO# 0.5 0.1 - 0.9 10e3/uL   Eosinophils Absolute 0.1 0.0 - 0.5 10e3/uL   Basophils Absolute 0.0 0.0 - 0.1 10e3/uL   NEUT% 49.0 38.4 - 76.8 %   LYMPH% 40.0 14.0 - 49.7 %   MONO% 8.9 0.0 - 14.0 %   EOS% 1.5 0.0 - 7.0 %   BASO% 0.6 0.0 - 2.0 %   No results found.  ROS  Blood pressure (!) 126/59, pulse 72, temperature 98 F (36.7 C), temperature source Oral, resp. rate 16, height _0  (1.727 m), weight 67.1 kg (148 lb). Physical Exam  Vitals (Tanisha A. Brown RMA; 09/02/2017 2:42 PM) 09/02/2017 2:42 PM Weight: 150.2 lb Height: 67in Body Surface Area: 1.79 m Body Mass Index: 23.52 kg/m  Temp.: 40F  Pulse: 97 (Regular)  P.OX: 97% (Room air) BP: 118/76 (Sitting, Left Arm, Standard) Physical Exam  General Mental Status-Alert. Orientation-Oriented X3. Head and Neck Trachea-midline. Thyroid Gland Characteristics - normal size and consistency. Eye Sclera/Conjunctiva - Bilateral-No scleral icterus. Chest and Lung Exam Chest and lung exam reveals -quiet, even and easy respiratory effort with no use of accessory  muscles and on auscultation, normal breath sounds, no adventitious sounds and normal vocal resonance. Breast Nipples-No Discharge. Breast Lump-No Palpable Breast Mass. Cardiovascular Cardiovascular examination reveals -normal heart sounds, regular rate and rhythm with no murmurs. Lymphatic Head & Neck General Head & Neck Lymphatics: Bilateral - Description - Normal. Axillary General Axillary Region: Bilateral - Description - Normal. Note: no [place] adenopathy  Assessment/Plan BREAST CANCER METASTASIZED TO AXILLARY LYMPH NODE, LEFT (C50.912) Story: Left ALND she has never had any breast disease that is identified. I discussed and recommended left alnd today and then whole breast radiotherapy once this is complete. we again discussed rationale for that and options. there is no benefit to her of mastectomy/breast surgery. we discussed alnd with overnight hospital stay, drainage, risks including lymphedema were discussed also. will proceed in early november when her sister is here from [place] to help care for her.  Rolm Bookbinder, MD 10/14/2017, 7:06 AM

## 2017-10-14 NOTE — Anesthesia Preprocedure Evaluation (Signed)
Anesthesia Evaluation  Patient identified by MRN, date of birth, ID band Patient awake    Reviewed: Allergy & Precautions, NPO status , Patient's Chart, lab work & pertinent test results  Airway Mallampati: II  TM Distance: >3 FB Neck ROM: Full    Dental  (+) Teeth Intact, Dental Advisory Given   Pulmonary neg pulmonary ROS,    Pulmonary exam normal breath sounds clear to auscultation       Cardiovascular hypertension, Pt. on medications Normal cardiovascular exam Rhythm:Regular Rate:Normal     Neuro/Psych negative neurological ROS     GI/Hepatic Neg liver ROS, GERD  Medicated,  Endo/Other  negative endocrine ROS  Renal/GU negative Renal ROS     Musculoskeletal negative musculoskeletal ROS (+)   Abdominal   Peds  Hematology negative hematology ROS (+)   Anesthesia Other Findings Day of surgery medications reviewed with the patient.  Left breast cancer  Reproductive/Obstetrics                             Anesthesia Physical Anesthesia Plan  ASA: II  Anesthesia Plan: General   Post-op Pain Management:  Regional for Post-op pain   Induction: Intravenous  PONV Risk Score and Plan: 3 and Ondansetron, Dexamethasone and Midazolam  Airway Management Planned: LMA  Additional Equipment:   Intra-op Plan:   Post-operative Plan: Extubation in OR  Informed Consent: I have reviewed the patients History and Physical, chart, labs and discussed the procedure including the risks, benefits and alternatives for the proposed anesthesia with the patient or authorized representative who has indicated his/her understanding and acceptance.   Dental advisory given  Plan Discussed with: CRNA  Anesthesia Plan Comments: (Risks/benefits of general anesthesia discussed with patient including risk of damage to teeth, lips, gum, and tongue, nausea/vomiting, allergic reactions to medications, and the  possibility of heart attack, stroke and death.  All patient questions answered.  Patient wishes to proceed.)        Anesthesia Quick Evaluation

## 2017-10-14 NOTE — Op Note (Signed)
Preoperative diagnosis: Occult left axillary breast cancer Postoperative diagnosis: Same as above Procedure: Left axillary lymph node dissection Surgeon: Dr. Serita Grammes Anesthesia General with pectoral block Estimated blood loss: 20 cc Drains: 19 French Blake drain to left axilla Specimens: Left axillary contents to pathology Complications: None Sponge needle count was correct at completion Disposition to recovery in stable condition  Indications: This is a 56 year old female who was noted to have left axillary lymphadenopathy.  This was biopsied and was consistent with a breast primary.  She underwent evaluation of her breast with mammogram and MRI and there were.  She has undergone primary chemotherapy with a good response in her left axillary adenopathy.  We discussed proceeding with a left axillary lymph node dissection and whole breast radiotherapy upon completion.  Procedure: After informed consent was obtained the patient was taken the operating room.  She underwent a pectoral block.  She was given antibiotics.  SCDs were in place.  She was then placed under general anesthesia without complication.  Her left axilla was then prepped and draped in the standard sterile surgical fashion.  A surgical timeout was then performed.  I made an incision from the pectoralis muscle to the latissimus.  I carried this through the axillary fascia.  I then proceeded to release the lower lying axillary nodes from the breast tissue and carried this cephalad.  I was able to identify the axillary vein, long thoracic nerve, and then irrigated.   These were all preserved and not injured.  I then proceeded to remove all the lymphatic tissue caudad to the vein and I swept this off of the muscle.  There were several enlarged lymph nodes present.  I passed this all off the table as a specimen.  I used several clips on some draining lymphatics and small vessels.Hemostasis was observed.  I placed a 51 [place] Blake  drain and secured this with a 2-0 nylon suture.  I placed some [place] in the cavity as well.  I then closed the axillary fascia with 2-0 Vicryl.  The dermis was closed with 3-0 Vicryl and the skin with 4-0 Monocryl.  Glue and Steri-Strips were applied.  A dressing was placed over the drain.  She tolerated this well was extubated and transferred to recovery in stable condition.

## 2017-10-14 NOTE — Transfer of Care (Signed)
Immediate Anesthesia Transfer of Care Note  Patient: Melissa [name]  Procedure(s) Performed: LEFT AXILLARY LYMPH NODE DISSECTION (Left Axilla)  Patient Location: PACU  Anesthesia Type:General  Level of Consciousness: awake and sedated  Airway & Oxygen Therapy: Patient Spontanous Breathing and Patient connected to face mask oxygen  Post-op Assessment: Report given to RN and Post -op Vital signs reviewed and stable  Post vital signs: Reviewed and stable  Last Vitals:  Vitals:   10/14/17 0705 10/14/17 0711  BP: (!) 126/59 111/61  Pulse: 72 79  Resp: 16 18  Temp: 36.7 C   SpO2: 100% 100%    Last Pain:  Vitals:   10/14/17 0705  TempSrc: Oral         Complications: No apparent anesthesia complications

## 2017-10-14 NOTE — Anesthesia Procedure Notes (Signed)
Anesthesia Regional Block: Pectoralis block   Pre-Anesthetic Checklist: ,, timeout performed, Correct Patient, Correct Site, Correct Laterality, Correct Procedure, Correct Position, site marked, Risks and benefits discussed,  Surgical consent,  Pre-op evaluation,  At surgeon's request and post-op pain management  Laterality: Left  Prep: chloraprep       Needles:  Injection technique: Single-shot  Needle Type: Echogenic Needle     Needle Length: 9cm  Needle Gauge: 21     Additional Needles:   Procedures:,,,, ultrasound used (permanent image in chart),,,,  Narrative:  Start time: 10/14/2017 7:10 AM End time: 10/14/2017 7:15 AM Injection made incrementally with aspirations every 5 mL.  Performed by: Personally  Anesthesiologist: Catalina Gravel, MD  Additional Notes: No pain on injection. No increased resistance to injection. Injection made in 5cc increments.  Good needle visualization.  Patient tolerated procedure well.

## 2017-10-14 NOTE — H&P (Signed)
Assisted Dr. Turk with left, ultrasound guided, pectoralis block. Side rails up, monitors on throughout procedure. See vital signs in flow sheet. Tolerated Procedure well. 

## 2017-10-14 NOTE — Anesthesia Postprocedure Evaluation (Signed)
Anesthesia Post Note  Patient: Melissa [name]  Procedure(s) Performed: LEFT AXILLARY LYMPH NODE DISSECTION (Left Axilla)     Patient location during evaluation: PACU Anesthesia Type: General Level of consciousness: awake and alert Pain management: pain level controlled Vital Signs Assessment: post-procedure vital signs reviewed and stable Respiratory status: spontaneous breathing, nonlabored ventilation and respiratory function stable Cardiovascular status: blood pressure returned to baseline and stable Postop Assessment: no apparent nausea or vomiting Anesthetic complications: no    Last Vitals:  Vitals:   10/14/17 1000 10/14/17 1100  BP:    Pulse:    Resp:    Temp:    SpO2: 98% 97%    Last Pain:  Vitals:   10/14/17 1100  TempSrc:   PainSc: 1                  Catalina Gravel

## 2017-10-14 NOTE — Interval H&P Note (Signed)
History and Physical Interval Note:  10/14/2017 7:08 AM  Melissa [name]  has presented today for surgery, with the diagnosis of LEFT BREAST CANCER  The various methods of treatment have been discussed with the patient and family. After consideration of risks, benefits and other options for treatment, the patient has consented to  Procedure(s): LEFT AXILLARY LYMPH NODE DISSECTION (Left) as a surgical intervention .  The patient's history has been reviewed, patient examined, no change in status, stable for surgery.  I have reviewed the patient's chart and labs.  Questions were answered to the patient's satisfaction.     Alie Moudy

## 2017-10-14 NOTE — Anesthesia Procedure Notes (Signed)
Procedure Name: LMA Insertion Performed by: Tionna Gigante W, CRNA Pre-anesthesia Checklist: Patient identified, Emergency Drugs available, Suction available and Patient being monitored Patient Re-evaluated:Patient Re-evaluated prior to induction Oxygen Delivery Method: Circle system utilized Preoxygenation: Pre-oxygenation with 100% oxygen Induction Type: IV induction Ventilation: Mask ventilation without difficulty LMA: LMA inserted LMA Size: 4.0 Number of attempts: 1 Placement Confirmation: positive ETCO2 Tube secured with: Tape Dental Injury: Teeth and Oropharynx as per pre-operative assessment        

## 2017-10-15 ENCOUNTER — Encounter (HOSPITAL_BASED_OUTPATIENT_CLINIC_OR_DEPARTMENT_OTHER): Payer: Self-pay | Admitting: General Surgery

## 2017-10-15 DIAGNOSIS — E7800 Pure hypercholesterolemia, unspecified: Secondary | ICD-10-CM | POA: Diagnosis not present

## 2017-10-15 DIAGNOSIS — C50912 Malignant neoplasm of unspecified site of left female breast: Secondary | ICD-10-CM | POA: Diagnosis not present

## 2017-10-15 DIAGNOSIS — Z90710 Acquired absence of both cervix and uterus: Secondary | ICD-10-CM | POA: Diagnosis not present

## 2017-10-15 DIAGNOSIS — Z8541 Personal history of malignant neoplasm of cervix uteri: Secondary | ICD-10-CM | POA: Diagnosis not present

## 2017-10-15 DIAGNOSIS — K219 Gastro-esophageal reflux disease without esophagitis: Secondary | ICD-10-CM | POA: Diagnosis not present

## 2017-10-15 DIAGNOSIS — I10 Essential (primary) hypertension: Secondary | ICD-10-CM | POA: Diagnosis not present

## 2017-10-15 DIAGNOSIS — Z79899 Other long term (current) drug therapy: Secondary | ICD-10-CM | POA: Diagnosis not present

## 2017-10-15 MED ORDER — METHOCARBAMOL 750 MG PO TABS: 750.0000 mg | ORAL_TABLET | Freq: Three times a day (TID) | ORAL | 0 refills | Status: DC | PRN

## 2017-10-15 MED ORDER — OXYCODONE HCL 5 MG PO TABS: 5.0000 mg | ORAL_TABLET | Freq: Four times a day (QID) | ORAL | 0 refills | Status: DC | PRN

## 2017-10-15 NOTE — Discharge Instructions (Signed)
Melissa [name], [place] [phone]   POST OP INSTRUCTIONS  Always review your discharge instruction sheet given to you by the facility where your [name] was performed. IF YOU HAVE DISABILITY OR FAMILY LEAVE FORMS, YOU MUST BRING THEM TO THE OFFICE FOR PROCESSING.   DO NOT GIVE THEM TO YOUR DOCTOR. A prescription for pain medication may be given to you upon discharge.  Take your pain medication as prescribed, if needed.  If narcotic pain medicine is not needed, then you may take acetaminophen (Tylenol), naprosyn (Alleve) or ibuprofen (Advil) as needed. 1. Take your usually prescribed medications unless otherwise directed. 2. If you need a refill on your pain medication, please contact your pharmacy.  They will contact our office to request authorization.  Prescriptions will not be filled after 5pm or on week-ends. 3. You should follow a light diet the first few days after arrival home, such as soup and crackers, etc.  Resume your normal diet the day after [name]. 4. Most patients will experience some swelling and bruising on the chest and underarm.  Ice packs will help.  Swelling and bruising can take several days to resolve. Wear the binder day and night until you return to the office.  5. It is common to experience some constipation if taking pain medication after [name].  Increasing fluid intake and taking a stool softener (such as Colace) will usually help or prevent this problem from occurring.  A mild laxative (Milk of Magnesia or Miralax) should be taken according to package instructions if there are no bowel movements after 48 hours. 6. Unless discharge instructions indicate otherwise, leave your bandage dry and in place until your next appointment in 3-5 days.  You may take a limited sponge bath.  No tube baths or showers until the drains are removed.  You may have steri-strips (small skin tapes) in place directly over the incision.  These strips should be left on the skin for  7-10 days. If you have glue it will come off in next couple week.  Any sutures will be removed at an office visit 7. DRAINS:  If you have drains in place, it is important to keep a list of the amount of drainage produced each day in your drains.  Before leaving the hospital, you should be instructed on drain care.  Call our office if you have any questions about your drains. I will remove your drains when they put out less than 30 cc or ml for 2 consecutive days. 8. ACTIVITIES:  You may resume regular (light) daily activities beginning the next day--such as daily self-care, walking, climbing stairs--gradually increasing activities as tolerated.  You may have sexual intercourse when it is comfortable.  Refrain from any heavy lifting or straining until approved by your doctor. a. You may drive when you are no longer taking prescription pain medication, you can comfortably wear a seatbelt, and you can safely maneuver your car and apply brakes. b. RETURN TO WORK:  __________________________________________________________ 9. You should see your doctor in the office for a follow-up appointment approximately 3-5 days after your [name].  Your doctors nurse will typically make your follow-up appointment when she calls you with your pathology report.  Expect your pathology report 3-4business days after [name]. 10. OTHER INSTRUCTIONS: ______________________________________________________________________________________________ ____________________________________________________________________________________________ WHEN TO CALL YOUR DR [place]: 1. Fever over 101.0 2. Nausea and/or vomiting 3. Extreme swelling or bruising 4. Continued bleeding from incision. 5. Increased pain, redness, or drainage from the incision. The clinic staff is available  to answer your questions during regular business hours.  Please dont hesitate to call and ask to speak to one of the nurses for clinical concerns.  If you have a  medical emergency, go to the nearest emergency room or call 911.  A surgeon from [name] is always on call at the hospital. [address] [place], [place], [place]  [address] ? [address], [place], [place]   [id number] [phone] ? [phone] ? FAX [phone] Web site: [email or web address]          Post Anesthesia Home Care Instructions  Activity: Get plenty of rest for the remainder of the day. A responsible individual must stay with you for 24 hours following the procedure.  For the next 24 hours, DO NOT: -Drive a car -Paediatric nurse -Drink alcoholic beverages -Take any medication unless instructed by your physician -Make any legal decisions or sign important papers.  Meals: Start with liquid foods such as gelatin or soup. Progress to regular foods as tolerated. Avoid greasy, spicy, heavy foods. If nausea and/or vomiting occur, drink only clear liquids until the nausea and/or vomiting subsides. Call your physician if vomiting continues.  Special Instructions/Symptoms: Your throat may feel dry or sore from the anesthesia or the breathing tube placed in your throat during [name]. If this causes discomfort, gargle with warm salt water. The discomfort should disappear within 24 hours.  If you had a scopolamine patch placed behind your ear for the management of post- operative nausea and/or vomiting:  1. The medication in the patch is effective for 72 hours, after which it should be removed.  Wrap patch in a tissue and discard in the trash. Wash hands thoroughly with soap and water. 2. You may remove the patch earlier than 72 hours if you experience unpleasant side effects which may include dry mouth, dizziness or visual disturbances. 3. Avoid touching the patch. Wash your hands with soap and water after contact with the patch.   Regional Anesthesia Blocks  1. Numbness or the inability to move the "blocked" extremity may last from  3-48 hours after placement. The length of time depends on the medication injected and your individual response to the medication. If the numbness is not going away after 48 hours, call your surgeon.  2. The extremity that is blocked will need to be protected until the numbness is gone and the  Strength has returned. Because you cannot feel it, you will need to take extra care to avoid injury. Because it may be weak, you may have difficulty moving it or using it. You may not know what position it is in without looking at it while the block is in effect.  3. For blocks in the legs and feet, returning to weight bearing and walking needs to be done carefully. You will need to wait until the numbness is entirely gone and the strength has returned. You should be able to move your leg and foot normally before you try and bear weight or walk. You will need someone to be with you when you first try to ensure you do not fall and possibly risk injury.  4. Bruising and tenderness at the needle site are common side effects and will resolve in a few days.  5. Persistent numbness or new problems with movement should be communicated to the surgeon or the [place] [phone] [place] [phone]).      JP Drain [employer] this sheet to all  of your post-operative appointments while you have your drains.  Please measure your drains by CC's or ML's.  Make sure you drain and measure your JP Drains 2 or 3 times per day.  At the end of each day, add up totals for the left side and add up totals for the right side.    ( 9 am )     ( 3 pm )        ( 9 pm )                Date L  R  L  R  L  R  Total L/R

## 2017-11-01 ENCOUNTER — Encounter (HOSPITAL_COMMUNITY): Payer: Self-pay | Admitting: Cardiology

## 2017-11-01 ENCOUNTER — Ambulatory Visit (HOSPITAL_COMMUNITY)
Admission: RE | Admit: 2017-11-01 | Discharge: 2017-11-01 | Disposition: A | Discharge status: Home / Self Care | Payer: BLUE CROSS/BLUE SHIELD | Source: Ambulatory Visit | Attending: Internal Medicine | Admitting: Internal Medicine

## 2017-11-01 ENCOUNTER — Ambulatory Visit (HOSPITAL_BASED_OUTPATIENT_CLINIC_OR_DEPARTMENT_OTHER)
Admission: RE | Admit: 2017-11-01 | Discharge: 2017-11-01 | Disposition: A | Discharge status: Home / Self Care | Payer: BLUE CROSS/BLUE SHIELD | Source: Ambulatory Visit | Attending: Cardiology | Admitting: Cardiology

## 2017-11-01 VITALS — BP 132/78 | HR 75 | Wt 146.1 lb

## 2017-11-01 DIAGNOSIS — Z79899 Other long term (current) drug therapy: Secondary | ICD-10-CM | POA: Diagnosis not present

## 2017-11-01 DIAGNOSIS — E785 Hyperlipidemia, unspecified: Secondary | ICD-10-CM | POA: Insufficient documentation

## 2017-11-01 DIAGNOSIS — K219 Gastro-esophageal reflux disease without esophagitis: Secondary | ICD-10-CM | POA: Insufficient documentation

## 2017-11-01 DIAGNOSIS — C50612 Malignant neoplasm of axillary tail of left female breast: Secondary | ICD-10-CM | POA: Diagnosis not present

## 2017-11-01 DIAGNOSIS — Z170 Estrogen receptor positive status [ER+]: Secondary | ICD-10-CM

## 2017-11-01 DIAGNOSIS — I10 Essential (primary) hypertension: Secondary | ICD-10-CM | POA: Insufficient documentation

## 2017-11-01 DIAGNOSIS — C801 Malignant (primary) neoplasm, unspecified: Secondary | ICD-10-CM

## 2017-11-01 NOTE — Progress Notes (Signed)
  Echocardiogram 2D Echocardiogram has been performed.  Jannett Celestine 11/01/2017, 1:44 PM

## 2017-11-02 ENCOUNTER — Ambulatory Visit: Payer: BLUE CROSS/BLUE SHIELD

## 2017-11-02 ENCOUNTER — Ambulatory Visit (HOSPITAL_BASED_OUTPATIENT_CLINIC_OR_DEPARTMENT_OTHER): Payer: BLUE CROSS/BLUE SHIELD

## 2017-11-02 ENCOUNTER — Other Ambulatory Visit: Payer: Self-pay | Admitting: *Deleted

## 2017-11-02 ENCOUNTER — Other Ambulatory Visit (HOSPITAL_BASED_OUTPATIENT_CLINIC_OR_DEPARTMENT_OTHER): Payer: BLUE CROSS/BLUE SHIELD

## 2017-11-02 VITALS — BP 131/73 | HR 69 | Temp 97.9°F | Resp 17 | Wt 147.5 lb

## 2017-11-02 DIAGNOSIS — C801 Malignant (primary) neoplasm, unspecified: Secondary | ICD-10-CM

## 2017-11-02 DIAGNOSIS — C50612 Malignant neoplasm of axillary tail of left female breast: Secondary | ICD-10-CM

## 2017-11-02 DIAGNOSIS — Z5112 Encounter for antineoplastic immunotherapy: Secondary | ICD-10-CM

## 2017-11-02 DIAGNOSIS — C773 Secondary and unspecified malignant neoplasm of axilla and upper limb lymph nodes: Secondary | ICD-10-CM | POA: Diagnosis not present

## 2017-11-02 DIAGNOSIS — Z170 Estrogen receptor positive status [ER+]: Principal | ICD-10-CM

## 2017-11-02 DIAGNOSIS — Z95828 Presence of other vascular implants and grafts: Secondary | ICD-10-CM

## 2017-11-02 LAB — CBC WITH DIFFERENTIAL/PLATELET
BASO%: 0.6 % (ref 0.0–2.0)
Basophils Absolute: 0 10*3/uL (ref 0.0–0.1)
EOS ABS: 0.2 10*3/uL (ref 0.0–0.5)
EOS%: 2.9 % (ref 0.0–7.0)
HCT: 31.6 % — ABNORMAL LOW (ref 34.8–46.6)
HEMOGLOBIN: 10.6 g/dL — AB (ref 11.6–15.9)
LYMPH#: 2 10*3/uL (ref 0.9–3.3)
LYMPH%: 38 % (ref 14.0–49.7)
MCH: 32.9 pg (ref 25.1–34.0)
MCHC: 33.5 g/dL (ref 31.5–36.0)
MCV: 98.2 fL (ref 79.5–101.0)
MONO#: 0.3 10*3/uL (ref 0.1–0.9)
MONO%: 6.5 % (ref 0.0–14.0)
NEUT%: 52 % (ref 38.4–76.8)
NEUTROS ABS: 2.7 10*3/uL (ref 1.5–6.5)
Platelets: 268 10*3/uL (ref 145–400)
RBC: 3.22 10*6/uL — ABNORMAL LOW (ref 3.70–5.45)
RDW: 13 % (ref 11.2–14.5)
WBC: 5.2 10*3/uL (ref 3.9–10.3)

## 2017-11-02 LAB — COMPREHENSIVE METABOLIC PANEL
ALBUMIN: 3.8 g/dL (ref 3.5–5.0)
ALK PHOS: 66 U/L (ref 40–150)
ALT: 20 U/L (ref 0–55)
AST: 20 U/L (ref 5–34)
Anion Gap: 10 mEq/L (ref 3–11)
BUN: 12.7 mg/dL (ref 7.0–26.0)
CO2: 24 mEq/L (ref 22–29)
Calcium: 9.6 mg/dL (ref 8.4–10.4)
Chloride: 102 mEq/L (ref 98–109)
Creatinine: 0.9 mg/dL (ref 0.6–1.1)
EGFR: >60 mL/min/{1.73_m2} (ref >60)
Glucose: 128 mg/dl (ref 70–140)
POTASSIUM: 3.5 meq/L (ref 3.5–5.1)
SODIUM: 137 meq/L (ref 136–145)
TOTAL PROTEIN: 6.9 g/dL (ref 6.4–8.3)
Total Bilirubin: 0.41 mg/dL (ref 0.20–1.20)

## 2017-11-02 MED ORDER — HEPARIN SOD (PORK) LOCK FLUSH 100 UNIT/ML IV SOLN
500.0000 [IU] | Freq: Once | INTRAVENOUS | Status: DC | PRN
  Filled 2017-11-02: qty 5

## 2017-11-02 MED ORDER — PERTUZUMAB CHEMO INJECTION 420 MG/14ML
420.0000 mg | Freq: Once | INTRAVENOUS | Status: AC
  Administered 2017-11-02: 420 mg via INTRAVENOUS
  Filled 2017-11-02: qty 14

## 2017-11-02 MED ORDER — GABAPENTIN 100 MG PO CAPS: 100.0000 mg | ORAL_CAPSULE | Freq: Every day | ORAL | 1 refills | Status: DC

## 2017-11-02 MED ORDER — ACETAMINOPHEN 325 MG PO TABS
ORAL_TABLET | ORAL | Status: AC
  Filled 2017-11-02: qty 2

## 2017-11-02 MED ORDER — SODIUM CHLORIDE 0.9 % IV SOLN
Freq: Once | INTRAVENOUS | Status: AC
  Administered 2017-11-02: 13:00:00 via INTRAVENOUS

## 2017-11-02 MED ORDER — ACETAMINOPHEN 325 MG PO TABS
650.0000 mg | ORAL_TABLET | Freq: Once | ORAL | Status: AC
  Administered 2017-11-02: 650 mg via ORAL

## 2017-11-02 MED ORDER — SODIUM CHLORIDE 0.9 % IV SOLN
450.0000 mg | Freq: Once | INTRAVENOUS | Status: AC
  Administered 2017-11-02: 450 mg via INTRAVENOUS
  Filled 2017-11-02: qty 21.43

## 2017-11-02 MED ORDER — DIPHENHYDRAMINE HCL 25 MG PO CAPS
50.0000 mg | ORAL_CAPSULE | Freq: Once | ORAL | Status: AC
  Administered 2017-11-02: 50 mg via ORAL

## 2017-11-02 MED ORDER — SODIUM CHLORIDE 0.9% FLUSH
10.0000 mL | INTRAVENOUS | Status: DC | PRN
  Filled 2017-11-02: qty 10

## 2017-11-02 MED ORDER — DIPHENHYDRAMINE HCL 25 MG PO CAPS
ORAL_CAPSULE | ORAL | Status: AC
  Filled 2017-11-02: qty 2

## 2017-11-02 MED ORDER — SODIUM CHLORIDE 0.9% FLUSH
10.0000 mL | INTRAVENOUS | Status: DC | PRN
  Administered 2017-11-02: 10 mL via INTRAVENOUS
  Filled 2017-11-02: qty 10

## 2017-11-02 NOTE — Progress Notes (Signed)
Oncology: Dr. Lindi [name]  56 yo with history of breast cancer, HTN, hyperlipidemia was referred for cardio-oncology evaluation by Dr. Lindi [name].  Axillary node met found initially in 5/18, ER+/PR+/HER2+.  Breast MRI did not show a primary tumor.  Plan for carboplatin/docetaxol/pertuzumab/trastuzumab x 6 cycles then trastuzumab to complete 1 year.  She has had left axillary node dissection in 11/18 and will need radiation.   She is doing well with chemotherapy so far.  No chest pain or exertional dyspnea.     PMH: 1. Breast cancer: Axillary node met found initially in 5/18, ER+/PR+/HER2+.  Breast MRI did not show a primary tumor.  Plan for carboplatin/docetaxol/pertuzumab/trastuzumab x 6 cycles then trastuzumab to complete 1 year.  Lymph node dissection L axilla in 11/18.  - Echo (5/18): EF 55-60% - Echo (8/18): EF 60-65%, GLS -18.2%, RV normal size and systolic function.  - Echo (11/18): EF 60-65%, GLS -20.4%, normal RV size and systolic function.  2. Hyperlipidemia 3. HTN 4. GERD  FH: Mitral valve prolapse.   Social History   Socioeconomic History  . Marital status: Married    Spouse name: None  . Number of children: None  . Years of education: None  . Highest education level: None  Social Needs  . Financial resource strain: None  . Food insecurity - worry: None  . Food insecurity - inability: None  . Transportation needs - medical: None  . Transportation needs - non-medical: None  Occupational History  . None  Tobacco Use  . Smoking status: Never Smoker  . Smokeless tobacco: Never Used  Substance and Sexual Activity  . Alcohol use: Yes    Comment: socially, wine during the week  . Drug use: No  . Sexual activity: None  Other Topics Concern  . None  Social History Narrative  . None   ROS: All systems reviewed and negative except as per HPI.   Current Outpatient Medications  Medication Sig Dispense Refill  . ALPRAZolam (XANAX) 0.5 MG tablet Take 0.5 mg by mouth 3 (three)  times daily.  0  . calcium carbonate (OS-CAL) 1250 (500 Ca) MG chewable tablet Chew 1 tablet by mouth daily. Calcium 600 mg    . cholecalciferol (VITAMIN D) 1000 units tablet Take 1,000 Units by mouth daily. 2000 iu    . lidocaine-prilocaine (EMLA) cream Apply to affected area once 30 g 3  . ondansetron (ZOFRAN) 8 MG tablet Take 1 tablet (8 mg total) by mouth 2 (two) times daily as needed for refractory nausea / vomiting. Start on day 3 after chemo. 30 tablet 1  . oxyCODONE (OXY IR/ROXICODONE) 5 MG immediate release tablet Take 1 tablet (5 mg total) every 6 (six) hours as needed by mouth for moderate pain. 15 tablet 0  . pantoprazole (PROTONIX) 40 MG tablet TAKE ONE TABLET BY MOUTH DAILY 30 tablet 0  . rosuvastatin (CRESTOR) 10 MG tablet   0  . triamterene-hydrochlorothiazide (MAXZIDE-25) 37.5-25 MG tablet Take 1 tablet by mouth daily.    . valACYclovir (VALTREX) 1000 MG tablet take 2 tablets by mouth every 12 hours if needed for 10 days  0  . zolpidem (AMBIEN) 5 MG tablet Take 1 tablet (5 mg total) at bedtime as needed by mouth for sleep. 30 tablet 3  . gabapentin (NEURONTIN) 100 MG capsule Take 1 capsule (100 mg total) by mouth at bedtime. 90 capsule 1   No current facility-administered medications for this encounter.    Facility-Administered Medications Ordered in Other Encounters  Medication Dose Route  Frequency Provider Last Rate Last Dose  . sodium chloride flush (NS) 0.9 % injection 10 mL  10 mL Intracatheter PRN Melissa Lose, MD   10 mL at 08/31/17 1457   BP 132/78   Pulse 75   Wt 146 lb 1.9 oz (66.3 kg)   SpO2 98%   BMI 22.22 kg/m  General: NAD Neck: No JVD, no thyromegaly or thyroid nodule.  Lungs: Clear to auscultation bilaterally with normal respiratory effort. CV: Nondisplaced PMI.  Heart regular S1/S2, no S3/S4, no murmur.  No peripheral edema.  No carotid bruit.  Normal pedal pulses.  Abdomen: Soft, nontender, no hepatosplenomegaly, no distention.  Skin: Intact without  lesions or rashes.  Neurologic: Alert and oriented x 3.  Psych: Normal affect. Extremities: No clubbing or cyanosis.  HEENT: Normal.   1. Breast cancer: Patient continues on Herceptin.  I reviewed today's echo.  EF and strain parameters remain normal.  - She will continue Herceptin, return for office visit and echo in 3 months.  2. HTN: BP controlled on current regimen.   Melissa [name] 11/02/2017

## 2017-11-02 NOTE — Telephone Encounter (Signed)
Patient here for treatment today and is complaining of some numbness in her feet.  She stated it started about 10 days ago and it pretty constant.  Informed her that it she could still have residual effects from the chemo. Per Dr. Lindi Adie she can try some neurontin at night to see if this helps.  Rx called into her pharmacy.

## 2017-11-05 ENCOUNTER — Telehealth: Payer: Self-pay

## 2017-11-05 MED ORDER — LEVOFLOXACIN 500 MG PO TABS: 500.0000 mg | ORAL_TABLET | Freq: Every day | ORAL | 0 refills | Status: DC

## 2017-11-05 NOTE — Telephone Encounter (Signed)
Sick, throat is like needles, chest congestion with non productive cough that almost throw up. Head congestion. No fever. 5-6 soft stools yesterday.   Had treatment on Tuesday.   S/w Dr Lindi Adie and rx levaquin. S/w pt and told her to take antibiotic and to call if gets worse or gets fever or no better by Monday morning.

## 2017-11-08 ENCOUNTER — Ambulatory Visit: Payer: BLUE CROSS/BLUE SHIELD | Attending: General Surgery | Admitting: Physical Therapy

## 2017-11-08 DIAGNOSIS — M25612 Stiffness of left shoulder, not elsewhere classified: Secondary | ICD-10-CM | POA: Insufficient documentation

## 2017-11-08 DIAGNOSIS — Z483 Aftercare following surgery for neoplasm: Secondary | ICD-10-CM | POA: Insufficient documentation

## 2017-11-08 DIAGNOSIS — M25512 Pain in left shoulder: Secondary | ICD-10-CM | POA: Insufficient documentation

## 2017-11-08 DIAGNOSIS — C773 Secondary and unspecified malignant neoplasm of axilla and upper limb lymph nodes: Secondary | ICD-10-CM | POA: Insufficient documentation

## 2017-11-09 ENCOUNTER — Ambulatory Visit: Payer: BLUE CROSS/BLUE SHIELD | Admitting: Physical Therapy

## 2017-11-09 DIAGNOSIS — Z483 Aftercare following surgery for neoplasm: Secondary | ICD-10-CM | POA: Diagnosis not present

## 2017-11-09 DIAGNOSIS — C773 Secondary and unspecified malignant neoplasm of axilla and upper limb lymph nodes: Secondary | ICD-10-CM

## 2017-11-09 DIAGNOSIS — M25612 Stiffness of left shoulder, not elsewhere classified: Secondary | ICD-10-CM

## 2017-11-09 DIAGNOSIS — M25512 Pain in left shoulder: Secondary | ICD-10-CM | POA: Diagnosis not present

## 2017-11-09 NOTE — Therapy (Addendum)
[place], [place], [address] Phone: [phone]   Fax:  [phone]  Physical Therapy Treatment  Patient Details  Name: Melissa [name] MRN: [id number] Date of Birth: [birth year] Referring Provider: Dr. Rolm Bookbinder   Encounter Date: 11/09/2017  PT End of Session - 11/09/17 1726    Visit Number  2    Number of Visits  3    Date for PT Re-Evaluation  12/02/17    PT Start Time  1522    PT Stop Time  1600    PT Time Calculation (min)  38 min    Activity Tolerance  Patient tolerated treatment well    Behavior During Therapy  [facility] for tasks assessed/performed       Past Medical History:  Diagnosis Date  . Breast cancer ([place]) 04/2017   left breast  . Cervical cancer ([place])    1990, treated with hysterectomy only  . GERD (gastroesophageal reflux disease)   . Hypercholesteremia   . Hypertension     Past Surgical History:  Procedure Laterality Date  . ABDOMINAL HYSTERECTOMY  1990  . AXILLARY LYMPH NODE DISSECTION Left 10/14/2017   Procedure: LEFT AXILLARY LYMPH NODE DISSECTION;  Surgeon: Rolm Bookbinder, MD;  Location: [facility];  Service: General;  Laterality: Left;  . EYE SURGERY     eye lift  . PORTACATH PLACEMENT Right 05/12/2017   Procedure: INSERTION PORT-A-CATH WITH ULTRASOUND;  Surgeon: Rolm Bookbinder, MD;  Location: [facility];  Service: General;  Laterality: Right;    There were no vitals filed for this visit.  Subjective Assessment - 11/09/17 1530    Subjective  Surgery was 10/14/17 for 13 lymph node dissection; no nodes were positive. Has had an upper respiratory infection and felt miserable, but was started on an antibiotic five days ago.    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 Finished chemo 08/11/17 except Herceptin and Projeta are ongoing until May.    Currently in Pain?  Yes    Pain Score  3     Pain Location  Breast     Pain Orientation  Left;Upper;Lateral and left upper arm    Pain Descriptors / Indicators  Burning;Constant    Aggravating Factors   worse at night (6/10) if she has done a lot    Pain Relieving Factors  ice was helping until she learned at [facility] class that we recommend against that; advil takes the edge off         [facility] PT Assessment - 11/09/17 0001      Observation/Other Assessments   Skin Integrity  lymph node incision is healing well with a small scab at lateral end; drainsite scab is still in place      AROM   Left Shoulder Extension  50 Degrees    Left Shoulder Flexion  118 Degrees    Left Shoulder ABduction  102 Degrees    Left Shoulder Internal Rotation  65 Degrees    Left Shoulder External Rotation  100 Degrees        LYMPHEDEMA/ONCOLOGY QUESTIONNAIRE - 11/09/17 1542      Right Upper Extremity Lymphedema   10 cm Proximal to Olecranon Process  25.9 cm    Olecranon Process  23.9 cm    10 cm Proximal to Ulnar Styloid Process  20.1 cm    Just Proximal to Ulnar Styloid Process  15 cm    Across Hand at [employer]  18.2 cm    At [facility] of 2nd Digit  6.2 cm      Left Upper Extremity Lymphedema   10 cm Proximal to Olecranon Process  25.9 cm    Olecranon Process  23.5 cm    10 cm Proximal to Ulnar Styloid Process  19.2 cm    Just Proximal to Ulnar Styloid Process  14.9 cm    Across Hand at [employer]  17.8 cm    At [place] of 2nd Digit  6 cm               [facility] Adult PT Treatment/Exercise - 11/09/17 0001      Self-Care   Self-Care  Other Self-Care Comments    Other Self-Care Comments   answered patient's questions about lymphedema and using compression sleeve that stemmed from her attending ABC class yesterday             PT Education - 11/09/17 1556    Education provided  Yes    Education Details  [facility] lymphedema pamphlet; [facility] lymphedema pamphlet    Person(s) Educated  Patient    Methods  Handout    Comprehension  Need further instruction           PT Long Term Goals - 11/09/17 1733      PT LONG TERM GOAL #1   Title  Pt. will have attained at least 135 degrees of left shoulder flexion for improved overhead reach.    Time  3    Period  Weeks      PT LONG TERM GOAL #2   Title  Pt. will show at least 145 degrees of left shoulder abduction to enable radiation positoning easily.    Time  3    Period  Weeks    Status  New      PT LONG TERM GOAL #3   Title  Pt. will show good understanding of lymphedema risk reduction practices.    Time  3    Period  Weeks    Status  New      Breast Clinic Goals - 10/07/17 1218      Patient will be able to verbalize understanding of pertinent lymphedema risk reduction practices relevant to her diagnosis specifically related to skin care.   Time  1    Period  Days    Status  Achieved      Patient will be able to return demonstrate and/or verbalize understanding of the post-op home exercise program related to regaining shoulder range of motion.   Time  1    Period  Days    Status  Achieved      Patient will be able to verbalize understanding of the importance of attending the postoperative After Breast Cancer Class for further lymphedema risk reduction education and therapeutic exercise.   Time  1    Period  Days    Status  Achieved       Long Term Clinic Goals - 10/07/17 1218      CC Long Term Goal  #1   Title  Patient will return to baseline following surgery with shoulder ROM and upper extremity function.    Time  8    Period  Weeks    Status  New         Plan - 11/09/17 1727    Clinical Impression Statement  Pt. returned for follow-up today a few weeks after her left axillary node dissection.  She attended ABC  class yesterday and got information about lymphedema risk reduction and about exercise.  She had more questions today which were answered for her.  Her ROM and circumference measurements were reassessed.  Circumference measurements do not indicate swelling.   A/ROM of left shoulder is less than prior to surgery for flexion and abduction (more for other movements), but it is good for this stage of recovery.  She can continue to do home exercises, then wants to return in a few weeks to make sure she remains on track and will be able to get into position for radiation.  I expect one more visit will be adequate.  This was a re-eval.    Rehab Potential  Excellent    Clinical Impairments Affecting Rehab Potential  None    PT Frequency  -- 1 additional follow-up    PT Treatment/Interventions  ADLs/Self Care Home Management;Therapeutic exercise;Patient/family education    PT Next Visit Plan  Recheck ROM; make sure she can achieve radiation position requirements; check whether she has obtained a prophylactic compression sleeve; answer any other questions.    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 Breast cancer, left ([place]) 10/14/2017  . Port catheter in place 07/20/2017  . 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 Malignant (primary) neoplasm, unspecified ([place]) 04/26/2017  . Secondary and unspecified malignant neoplasm  of axilla and upper limb lymph nodes ([place]) 04/21/2017    Jezabelle Chisolm 11/09/2017, 5:37 PM  [place], [place], [address] Phone: [phone]   Fax:  [phone]  Name: AMELIE CARACCI MRN: [id number] Date of Birth: Apr 05, 1961  Serafina Royals, PT 11/09/17 5:37 PM  PHYSICAL THERAPY DISCHARGE SUMMARY  Visits from Start of Care: 2  Current functional level related to goals / functional outcomes: Unknown.  The patient attended ABC class and was given information at the therapy visit dated this day to work independently on improving ROM and getting a prophylactic compression sleeve.  She was to return for follow-up check of status several weeks later, but did not.   Remaining deficits: unknown   Education / Equipment: Home exercise for stretching, lymphedema risk reduction. Plan: Patient agrees to discharge.  Patient goals were not met. Patient is being discharged due to not returning since the last visit.  ?????    Serafina Royals, PT 03/17/18 2:24 PM

## 2017-11-12 ENCOUNTER — Other Ambulatory Visit: Payer: Self-pay | Admitting: *Deleted

## 2017-11-12 NOTE — Progress Notes (Signed)
Location of Breast Cancer: ? Left Breast  Histology per Pathology Report:  04/14/17 Diagnosis Lymph node, needle/core biopsy, left axillary - METASTATIC CARCINOMA.  Receptor Status: ER(40%), PR (NEG), Her2-neu (POS), Ki-(90%)  10/14/17 Diagnosis Lymph nodes, regional resection, Left Axillary - THIRTEEN OF THIRTEEN LYMPH NODE NEGATIVE FOR CARCINOMA (0/13). - TREATMENT EFFECT.  Did patient present with symptoms or was this found on screening mammography?: She had a routine mammogram which showed 3 positive lymph nodes. She never felt any lumps.  Past/Anticipated interventions by surgeon, if any: -05/12/17 Procedure: right ij US guided powerport insertion Surgeon: Dr Serita Grammes -10/14/17 Procedure: Left axillary lymph node dissection Surgeon: Dr. Serita Grammes   Past/Anticipated interventions by medical oncology, if any:  10/12/17 Dr. Lindi Adie Treatment plan: 1. Neoadjuvant TCH Perjeta 6 cycles completed 08/31/2017 followed by Herceptin, Perjeta maintenance for 1 year 2. followed by axillary lymph node dissection. Breast surgery will not be performed because there is no primary tumor identifiable on breast MRI (completed 118/18) 3. Followed by radiation 4. Followed by antiestrogen therapy  Next appointment: 11/23/17 with Dr. Lindi Adie and will receive Herceptin, Perjeta maintenance that day.   Lymphedema issues, if any:  She denies. She does have numbness to her left arm after surgery. She has good arm mobilty.  Pain issues, if any:  She has a dull constant pain to her Left arm.   SAFETY ISSUES:  Prior radiation? No  Pacemaker/ICD? No  Possible current pregnancy? No, history of hysterectomy  Is the patient on methotrexate? No  Current Complaints / other details:    BP 127/79   Pulse 78   Temp 98.1 F (36.7 C)   Ht 5' 8"  (1.727 m)   Wt 143 lb 9.6 oz (65.1 kg)   SpO2 99% Comment: room air  BMI 21.83 kg/m    Wt Readings from Last 3 Encounters:  11/17/17 143 lb 9.6  oz (65.1 kg)  11/02/17 147 lb 8 oz (66.9 kg)  11/01/17 146 lb 1.9 oz (66.3 kg)      [name], Stephani Police, RN 11/12/2017,2:43 PM

## 2017-11-15 ENCOUNTER — Other Ambulatory Visit: Payer: Self-pay | Admitting: Hematology and Oncology

## 2017-11-17 ENCOUNTER — Ambulatory Visit
Admission: RE | Admit: 2017-11-17 | Discharge: 2017-11-17 | Disposition: A | Discharge status: Home / Self Care | Payer: BLUE CROSS/BLUE SHIELD | Source: Ambulatory Visit | Attending: Radiation Oncology | Admitting: Radiation Oncology

## 2017-11-17 ENCOUNTER — Encounter: Payer: Self-pay | Admitting: Radiation Oncology

## 2017-11-17 DIAGNOSIS — Z170 Estrogen receptor positive status [ER+]: Secondary | ICD-10-CM | POA: Diagnosis not present

## 2017-11-17 DIAGNOSIS — C773 Secondary and unspecified malignant neoplasm of axilla and upper limb lymph nodes: Secondary | ICD-10-CM | POA: Insufficient documentation

## 2017-11-17 DIAGNOSIS — Z79899 Other long term (current) drug therapy: Secondary | ICD-10-CM | POA: Diagnosis not present

## 2017-11-17 DIAGNOSIS — C50612 Malignant neoplasm of axillary tail of left female breast: Secondary | ICD-10-CM | POA: Diagnosis not present

## 2017-11-17 DIAGNOSIS — Z79891 Long term (current) use of opiate analgesic: Secondary | ICD-10-CM | POA: Insufficient documentation

## 2017-11-17 DIAGNOSIS — Z510 Encounter for antineoplastic radiation therapy: Secondary | ICD-10-CM | POA: Diagnosis not present

## 2017-11-17 DIAGNOSIS — Z9221 Personal history of antineoplastic chemotherapy: Secondary | ICD-10-CM | POA: Diagnosis not present

## 2017-11-17 DIAGNOSIS — C801 Malignant (primary) neoplasm, unspecified: Secondary | ICD-10-CM | POA: Diagnosis not present

## 2017-11-17 NOTE — Progress Notes (Signed)
Radiation Oncology         [phone] ________________________________  Name: Melissa [name] MRN: [id number]  Date: 11/17/2017  DOB: 27-Jun-1961  Follow-Up Visit Note  Outpatient  CC: Deland Pretty, MD  Nicholas Lose, MD  Diagnosis:      ICD-10-CM   1. 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 Since last being seen during initial consult her MRI results came out, showing no primary in the breasts and several abnormal appearing axillary nodes.  CT of CAP negative for mets,   bone scan negative for metastases.  She has tolerated neoadjuvant TCH Perjeta x 6 cycles which was completed on 08/31/17 followed by Herceptin Perjeta maintenance for a year and antiestrogen therapy. Following initial treatment she underwent left axillary lymph node dissection performed by Dr Donne Hazel on 10/14/17. Surgical pathology from this procedure showed all thirteen lymph nodes that were examined to be negative for the presence of carcinoma. She was subsequently referred back into radiation oncology to discuss the potential role of radiotherapy in the ongoing management of her disease.   Since her surgery she has otherwise been recovering well and without acute complaints. She denies any issues with lymphedema.  Driving today to [facility] with her husband for a few days. Wishes to start RT on 1/2 after the holidays.           ALLERGIES:  has No Known Allergies.  Meds: Current Outpatient Medications  Medication Sig Dispense Refill  . ALPRAZolam (XANAX) 0.5 MG tablet Take 0.5 mg by mouth 3 (three) times daily.  0  . calcium carbonate (OS-CAL) 1250 (500 Ca) MG chewable tablet Chew 1 tablet by mouth daily. Calcium 600 mg    .  cholecalciferol (VITAMIN D) 1000 units tablet Take 1,000 Units by mouth daily. 2000 iu    . gabapentin (NEURONTIN) 100 MG capsule Take 1 capsule (100 mg total) by mouth at bedtime. 90 capsule 1  . lidocaine-prilocaine (EMLA) cream Apply to affected area once 30 g 3  . rosuvastatin (CRESTOR) 10 MG tablet   0  . triamterene-hydrochlorothiazide (MAXZIDE-25) 37.5-25 MG tablet Take 1 tablet by mouth daily.    Marland Kitchen zolpidem (AMBIEN) 5 MG tablet Take 1 tablet (5 mg total) at bedtime as needed by mouth for sleep. 30 tablet 3  . methocarbamol (ROBAXIN) 750 MG tablet TK 1 T PO Q 8 H PRF MUSCLE CRAMPS / PAIN  0  . ondansetron (ZOFRAN) 8 MG tablet Take 1 tablet (8 mg total) by mouth 2 (two) times daily as needed for refractory nausea / vomiting. Start on day 3 after chemo. (Patient not taking: Reported on 11/17/2017) 30 tablet 1  . oxyCODONE (OXY IR/ROXICODONE) 5 MG immediate release tablet Take 1 tablet (5 mg total) every 6 (six) hours as needed by mouth for moderate pain. (Patient not taking: Reported on 11/17/2017) 15 tablet 0  . pantoprazole (PROTONIX) 40 MG tablet TAKE ONE TABLET BY MOUTH DAILY (Patient not taking: Reported on 11/17/2017) 30 tablet 0  . valACYclovir (VALTREX) 1000 MG tablet take 2 tablets by mouth every 12 hours if needed for 10 days  0   No current facility-administered medications for this encounter.    Facility-Administered Medications Ordered in Other Encounters  Medication Dose Route Frequency Provider Last Rate Last Dose  . sodium chloride flush (NS) 0.9 % injection 10 mL  10 mL Intracatheter PRN Nicholas Lose, MD   10 mL at 08/31/17 1457    Physical Findings:  height is 5' 8"  (1.727 m) and weight is 143 lb 9.6 oz (65.1 kg). Her temperature is 98.1 F (36.7 C). Her blood pressure is 127/79 and her pulse is 78. Her oxygen saturation is 99%. .     General: Alert and oriented, in no acute distress HEENT: Head is normocephalic. Extraocular movements are intact. Oropharynx is  clear. Neck: Neck is supple, no palpable cervical or supraclavicular lymphadenopathy. Heart: Regular in rate and rhythm with no murmurs, rubs, or gallops. Chest: Clear to auscultation bilaterally, with no rhonchi, wheezes, or rales. Abdomen: Soft, nontender, nondistended, with no rigidity or guarding. Extremities: No cyanosis or edema. Lymphatics: see Neck Exam Psychiatric: Judgment and insight are intact. Affect is appropriate. Breast exam reveals good healing of left axillary scar  Lab Findings: Lab Results  Component Value Date   WBC 5.2 11/02/2017   HGB 10.6 (L) 11/02/2017   HCT 31.6 (L) 11/02/2017   MCV 98.2 11/02/2017   PLT 268 11/02/2017   CMP Latest Ref Rng & Units 11/02/2017 10/12/2017 09/21/2017  Glucose 70 - 140 mg/dl 128 109 134  BUN 7.0 - 26.0 mg/dL 12.7 13.4 8.4  Creatinine 0.6 - 1.1 mg/dL 0.9 0.9 1.0  Sodium 136 - 145 mEq/L 137 140 141  Potassium 3.5 - 5.1 mEq/L 3.5 3.4(L) 2.9(LL)  Chloride 101 - 111 mmol/L - - -  CO2 22 - 29 mEq/L 24 29 31(H)  Calcium 8.4 - 10.4 mg/dL 9.6 9.8 9.5  Total Protein 6.4 - 8.3 g/dL 6.9 6.8 6.8  Total Bilirubin 0.20 - 1.20 mg/dL 0.41 0.49 0.45  Alkaline Phos 40 - 150 U/L 66 60 60  AST 5 - 34 U/L 20 23 32  ALT 0 - 55 U/L 20 25 34   Radiographic Findings: No results found.  Impression/Plan: Left axillary cancer, considered breast primary - unknown location   We discussed adjuvant radiotherapy today.  I recommend radiation to the left breast/axillary SCV tissue in order to reduce the risk of locoregional recurrence.  The risks, benefits and side effects of this treatment were discussed in detail.  She understands that radiotherapy is associated with skin irritation and fatigue in the acute setting. Late effects can include cosmetic changes and rare injury to internal organs.   She is enthusiastic about proceeding with treatment. A consent form has been  signed and placed in her chart.  A total of 5 medically necessary complex treatment  devices will be fabricated and supervised by me: 4 fields with MLCs for custom blocks to protect heart, and lungs;  and, a Vac-lok. MORE COMPLEX DEVICES MAY BE MADE IN DOSIMETRY FOR FIELD IN FIELD BEAMS FOR DOSE HOMOGENEITY.  I have requested : 3D Simulation which is medically necessary to give adequate dose to at risk tissues while sparing lungs and heart.  I have requested a DVH of the following structures: lungs, heart, esophagus, cord.    The patient will receive 50 Gy in 25 fractions to the left breast and SCV/Axillary with 4 fields.  This will not be followed by a boost.  Advised patient to ambulate regularly during car trip to avoid DVTs.  We will simulate RT next week and start 1.2.19.  I spent 25 minutes minutes face to face with the patient and more  than 50% of that time was spent in counseling and/or coordination of care. _____________________________________   Eppie Gibson, MD  This document serves as a record of services personally performed by Eppie Gibson, MD. It was created on her behalf by Reola Mosher, a trained medical scribe. The creation of this record is based on the scribe's personal observations and the provider's statements to them. This document has been checked and approved by the attending provider.

## 2017-11-22 NOTE — Assessment & Plan Note (Signed)
04/14/2017 Left axillary lymph node biopsy: Metastatic carcinoma positive for CK 7, ER positive,GATA-3 equivocal, negative for CK 20,GCDFP, CDX-2, TTF-1; HER-2 positive ratio 2.08 mammogram and ultrasound revealed 3 discrete abnormal left axillary lymph nodes largest measures 1.8 cm Clinical stage: TX N1MX  Treatment plan: 1. Neoadjuvant TCH Perjeta 6 cycles completed 08/31/2017 followed by Herceptin, Perjeta maintenance for 1 year 2. followed by axillary lymph node dissection. Breast surgery will not be performed because there is no primary tumor identifiable on breast MRI: 10/14/17: 0/13 LN Negative 3. Followed by radiation 4. Followed by antiestrogen therapy ------------------------------------------------------------------------------------------------------------------ Current Treatment:Herceptin Perjeta maintenance therapy for 1 year Toxicities:  Continue with maintenance therapy with Herceptin and Perjeta every 3 weeks F/U with me in 6 weeks

## 2017-11-23 ENCOUNTER — Other Ambulatory Visit (HOSPITAL_BASED_OUTPATIENT_CLINIC_OR_DEPARTMENT_OTHER): Payer: BLUE CROSS/BLUE SHIELD

## 2017-11-23 ENCOUNTER — Encounter: Payer: Self-pay | Admitting: *Deleted

## 2017-11-23 ENCOUNTER — Ambulatory Visit (HOSPITAL_BASED_OUTPATIENT_CLINIC_OR_DEPARTMENT_OTHER): Payer: BLUE CROSS/BLUE SHIELD | Admitting: Hematology and Oncology

## 2017-11-23 ENCOUNTER — Ambulatory Visit (HOSPITAL_BASED_OUTPATIENT_CLINIC_OR_DEPARTMENT_OTHER): Payer: BLUE CROSS/BLUE SHIELD

## 2017-11-23 ENCOUNTER — Ambulatory Visit: Payer: BLUE CROSS/BLUE SHIELD

## 2017-11-23 VITALS — BP 118/72 | HR 72 | Temp 98.9°F | Resp 18

## 2017-11-23 DIAGNOSIS — Z170 Estrogen receptor positive status [ER+]: Principal | ICD-10-CM

## 2017-11-23 DIAGNOSIS — C50612 Malignant neoplasm of axillary tail of left female breast: Secondary | ICD-10-CM

## 2017-11-23 DIAGNOSIS — C773 Secondary and unspecified malignant neoplasm of axilla and upper limb lymph nodes: Secondary | ICD-10-CM

## 2017-11-23 DIAGNOSIS — C801 Malignant (primary) neoplasm, unspecified: Secondary | ICD-10-CM

## 2017-11-23 DIAGNOSIS — Z5112 Encounter for antineoplastic immunotherapy: Secondary | ICD-10-CM | POA: Diagnosis not present

## 2017-11-23 DIAGNOSIS — Z95828 Presence of other vascular implants and grafts: Secondary | ICD-10-CM

## 2017-11-23 LAB — CBC WITH DIFFERENTIAL/PLATELET
BASO%: 0.2 % (ref 0.0–2.0)
Basophils Absolute: 0 10*3/uL (ref 0.0–0.1)
EOS ABS: 0.1 10*3/uL (ref 0.0–0.5)
EOS%: 1.6 % (ref 0.0–7.0)
HCT: 33.3 % — ABNORMAL LOW (ref 34.8–46.6)
HEMOGLOBIN: 11.3 g/dL — AB (ref 11.6–15.9)
LYMPH%: 40.1 % (ref 14.0–49.7)
MCH: 32.4 pg (ref 25.1–34.0)
MCHC: 33.9 g/dL (ref 31.5–36.0)
MCV: 95.4 fL (ref 79.5–101.0)
MONO#: 0.3 10*3/uL (ref 0.1–0.9)
MONO%: 5.9 % (ref 0.0–14.0)
NEUT#: 3 10*3/uL (ref 1.5–6.5)
NEUT%: 52.2 % (ref 38.4–76.8)
Platelets: 245 10*3/uL (ref 145–400)
RBC: 3.49 10*6/uL — ABNORMAL LOW (ref 3.70–5.45)
RDW: 12.3 % (ref 11.2–14.5)
WBC: 5.7 10*3/uL (ref 3.9–10.3)
lymph#: 2.3 10*3/uL (ref 0.9–3.3)

## 2017-11-23 LAB — COMPREHENSIVE METABOLIC PANEL
ALT: 16 U/L (ref 0–55)
AST: 20 U/L (ref 5–34)
Albumin: 3.8 g/dL (ref 3.5–5.0)
Alkaline Phosphatase: 69 U/L (ref 40–150)
Anion Gap: 10 mEq/L (ref 3–11)
BILIRUBIN TOTAL: 0.52 mg/dL (ref 0.20–1.20)
BUN: 16.3 mg/dL (ref 7.0–26.0)
CHLORIDE: 102 meq/L (ref 98–109)
CO2: 28 meq/L (ref 22–29)
CREATININE: 0.8 mg/dL (ref 0.6–1.1)
Calcium: 9.8 mg/dL (ref 8.4–10.4)
EGFR: >60 mL/min/{1.73_m2} (ref >60)
GLUCOSE: 94 mg/dL (ref 70–140)
Potassium: 3.5 mEq/L (ref 3.5–5.1)
Sodium: 140 mEq/L (ref 136–145)
TOTAL PROTEIN: 6.9 g/dL (ref 6.4–8.3)

## 2017-11-23 MED ORDER — SODIUM CHLORIDE 0.9 % IV SOLN
Freq: Once | INTRAVENOUS | Status: AC
  Administered 2017-11-23: 12:00:00 via INTRAVENOUS

## 2017-11-23 MED ORDER — ACETAMINOPHEN 325 MG PO TABS
650.0000 mg | ORAL_TABLET | Freq: Once | ORAL | Status: AC
  Administered 2017-11-23: 650 mg via ORAL

## 2017-11-23 MED ORDER — SODIUM CHLORIDE 0.9% FLUSH
10.0000 mL | INTRAVENOUS | Status: DC | PRN
  Administered 2017-11-23: 10 mL
  Filled 2017-11-23: qty 10

## 2017-11-23 MED ORDER — TRASTUZUMAB CHEMO 150 MG IV SOLR
450.0000 mg | Freq: Once | INTRAVENOUS | Status: AC
  Administered 2017-11-23: 450 mg via INTRAVENOUS
  Filled 2017-11-23: qty 21.43

## 2017-11-23 MED ORDER — ACETAMINOPHEN 325 MG PO TABS
ORAL_TABLET | ORAL | Status: AC
  Filled 2017-11-23: qty 2

## 2017-11-23 MED ORDER — HEPARIN SOD (PORK) LOCK FLUSH 100 UNIT/ML IV SOLN
500.0000 [IU] | Freq: Once | INTRAVENOUS | Status: DC | PRN
  Filled 2017-11-23: qty 5

## 2017-11-23 MED ORDER — DIPHENHYDRAMINE HCL 25 MG PO CAPS
ORAL_CAPSULE | ORAL | Status: AC
  Filled 2017-11-23: qty 2

## 2017-11-23 MED ORDER — DIPHENHYDRAMINE HCL 25 MG PO CAPS
50.0000 mg | ORAL_CAPSULE | Freq: Once | ORAL | Status: AC
  Administered 2017-11-23: 50 mg via ORAL

## 2017-11-23 MED ORDER — SODIUM CHLORIDE 0.9 % IV SOLN
420.0000 mg | Freq: Once | INTRAVENOUS | Status: AC
  Administered 2017-11-23: 420 mg via INTRAVENOUS
  Filled 2017-11-23: qty 14

## 2017-11-23 MED ORDER — HEPARIN SOD (PORK) LOCK FLUSH 100 UNIT/ML IV SOLN
500.0000 [IU] | Freq: Once | INTRAVENOUS | Status: AC | PRN
  Administered 2017-11-23: 500 [IU]
  Filled 2017-11-23: qty 5

## 2017-11-23 MED ORDER — SODIUM CHLORIDE 0.9% FLUSH
10.0000 mL | INTRAVENOUS | Status: DC | PRN
  Administered 2017-11-23: 10 mL via INTRAVENOUS
  Filled 2017-11-23: qty 10

## 2017-11-23 MED ORDER — LIDOCAINE-PRILOCAINE 2.5-2.5 % EX CREA: TOPICAL_CREAM | CUTANEOUS | 3 refills | Status: DC

## 2017-11-23 NOTE — Progress Notes (Signed)
Patient Care Team: Deland Pretty, MD as PCP - General (Internal Medicine)  DIAGNOSIS:  Encounter Diagnoses  Name Primary?  . 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[place])   . 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, estrogen receptor positive ([place])     SUMMARY OF ONCOLOGIC HISTORY:   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[place])   04/14/2017 Initial Diagnosis    Left axillary lymph node biopsy: Metastatic carcinoma positive for CK 7, ER positive,GATA-3 equivocal, negative for CK 20,GCDFP, CDX-2, TTF-1; HER-2 positive ratio 2.08; mammogram and ultrasound revealed 3 discrete abnormal left axillary lymph nodes largest measures 1.8 cm      04/23/2017 Breast MRI    Left axillary lymphadenopathy numerous enlarged level I axillary lymph nodes largest 2.1 cm, no other abdominal masses in the left breast itself, no MRI evidence of malignancy in the right breast      04/26/2017 Imaging    CT CAP: Prominent left axillary lymph nodes no distant metastases, sclerotic focus left pedicle of T9 vertebra favored benign, T5 vertebral hemangioma      05/12/2017 - 08/31/2017 Neo-Adjuvant Chemotherapy    TCH Perjeta x6 followed by Herceptin Perjeta maintenance for 1 year      10/14/2017 Surgery    Left axillary lymph node dissection: 0/13 lymph nodes complete pathologic response.  Breast was not operated on because no breast primary was ever identified       CHIEF COMPLIANT: Herceptin Perjeta maintenance  INTERVAL HISTORY: Melissa [name] is a 56 year old with above-mentioned history of axillary metastatic breast cancer presenting as lymph node involvement without any breast primary who underwent neoadjuvant chemotherapy and underwent a left axillary lymph node dissection.  She has recovered very well from the recent surgery.  She is here to continue with Herceptin and Perjeta maintenance.  REVIEW  OF SYSTEMS:   Constitutional: Denies fevers, chills or abnormal weight loss Eyes: Denies blurriness of vision Ears, nose, mouth, throat, and face: Denies mucositis or sore throat Respiratory: Denies cough, dyspnea or wheezes Cardiovascular: Denies palpitation, chest discomfort Gastrointestinal:  Denies nausea, heartburn or change in bowel habits Skin: Denies abnormal skin rashes Lymphatics: Denies new lymphadenopathy or easy bruising Neurological:Denies numbness, tingling or new weaknesses Behavioral/Psych: Mood is stable, no new changes  Extremities: No lower extremity edema Breast: Left axillary lymph node dissection is healing well All other systems were reviewed with the patient and are negative.  I have reviewed the past medical history, past surgical history, social history and family history with the patient and they are unchanged from previous note.  ALLERGIES:  has No Known Allergies.  MEDICATIONS:  Current Outpatient Medications  Medication Sig Dispense Refill  . ALPRAZolam (XANAX) 0.5 MG tablet Take 0.5 mg by mouth 3 (three) times daily.  0  . calcium carbonate (OS-CAL) 1250 (500 Ca) MG chewable tablet Chew 1 tablet by mouth daily. Calcium 600 mg    . cholecalciferol (VITAMIN D) 1000 units tablet Take 1,000 Units by mouth daily. 2000 iu    . lidocaine-prilocaine (EMLA) cream Apply to affected area once 30 g 3  . rosuvastatin (CRESTOR) 10 MG tablet   0  . triamterene-hydrochlorothiazide (MAXZIDE-25) 37.5-25 MG tablet Take 1 tablet by mouth daily.    . valACYclovir (VALTREX) 1000 MG tablet take 2 tablets by mouth every 12 hours if needed for 10 days  0  . zolpidem (AMBIEN) 5 MG tablet Take 1 tablet (  5 mg total) at bedtime as needed by mouth for sleep. 30 tablet 3   No current facility-administered medications for this visit.    Facility-Administered Medications Ordered in Other Visits  Medication Dose Route Frequency Provider Last Rate Last Dose  . heparin lock flush 100  unit/mL  500 Units Intracatheter Once PRN Nicholas Lose, MD      . pertuzumab (PERJETA) 420 mg in sodium chloride 0.9 % 250 mL chemo infusion  420 mg Intravenous Once Nicholas Lose, MD      . sodium chloride flush (NS) 0.9 % injection 10 mL  10 mL Intracatheter PRN Nicholas Lose, MD      . trastuzumab (HERCEPTIN) 450 mg in sodium chloride 0.9 % 250 mL chemo infusion  450 mg Intravenous Once Nicholas Lose, MD        PHYSICAL EXAMINATION: ECOG PERFORMANCE STATUS: 1 - Symptomatic but completely ambulatory  Vitals:   11/23/17 1126  BP: 128/82  Pulse: 74  Resp: 18  Temp: 98.1 F (36.7 C)  SpO2: 100%   Filed Weights   11/23/17 1126  Weight: 143 lb 3.2 oz (65 kg)    GENERAL:alert, no distress and comfortable SKIN: skin color, texture, turgor are normal, no rashes or significant lesions EYES: normal, Conjunctiva are pink and non-injected, sclera clear OROPHARYNX:no exudate, no erythema and lips, buccal mucosa, and tongue normal  NECK: supple, thyroid normal size, non-tender, without nodularity LYMPH:  no palpable lymphadenopathy in the cervical, axillary or inguinal LUNGS: clear to auscultation and percussion with normal breathing effort HEART: regular rate & rhythm and no murmurs and no lower extremity edema ABDOMEN:abdomen soft, non-tender and normal bowel sounds MUSCULOSKELETAL:no cyanosis of digits and no clubbing  NEURO: alert & oriented x 3 with fluent speech, no focal motor/sensory deficits EXTREMITIES: No lower extremity edema  LABORATORY DATA:  I have reviewed the data as listed   Chemistry      Component Value Date/Time   NA 140 11/23/2017 1032   K 3.5 11/23/2017 1032   CL 91 (L) 08/15/2017 1654   CO2 28 11/23/2017 1032   BUN 16.3 11/23/2017 1032   CREATININE 0.8 11/23/2017 1032      Component Value Date/Time   CALCIUM 9.8 11/23/2017 1032   ALKPHOS 69 11/23/2017 1032   AST 20 11/23/2017 1032   ALT 16 11/23/2017 1032   BILITOT 0.52 11/23/2017 1032       Lab  Results  Component Value Date   WBC 5.7 11/23/2017   HGB 11.3 (L) 11/23/2017   HCT 33.3 (L) 11/23/2017   MCV 95.4 11/23/2017   PLT 245 11/23/2017   NEUTROABS 3.0 11/23/2017    ASSESSMENT & PLAN:  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[place]) 04/14/2017 Left axillary lymph node biopsy: Metastatic carcinoma positive for CK 7, ER positive,GATA-3 equivocal, negative for CK 20,GCDFP, CDX-2, TTF-1; HER-2 positive ratio 2.08 mammogram and ultrasound revealed 3 discrete abnormal left axillary lymph nodes largest measures 1.8 cm Clinical stage: TX N1MX  Treatment plan: 1. Neoadjuvant TCH Perjeta 6 cycles completed 08/31/2017 followed by Herceptin, Perjeta maintenance for 1 year 2. followed by axillary lymph node dissection. Breast surgery will not be performed because there is no primary tumor identifiable on breast MRI: Left axillary lymph node dissection 10/14/17: 0/13 LN Negative 3. Followed by radiation 4. Followed by antiestrogen therapy ------------------------------------------------------------------------------------------------------------------ Current Treatment:Herceptin Perjeta maintenance therapy for 1 year Toxicities: Diarrhea: Improving Fatigue: Markedly better  Patient will proceed with adjuvant radiation starting in  January and after that she will start antiestrogen therapy. Continue with maintenance therapy with Herceptin and Perjeta every 3 weeks F/U with me in 6 weeks   I spent 25 minutes talking to the patient of which more than half was spent in counseling and coordination of care.  No orders of the defined types were placed in this encounter.  The patient has a good understanding of the overall plan. she agrees with it. she will call with any problems that may develop before the next visit here.   Rulon Eisenmenger, MD 11/23/17

## 2017-11-23 NOTE — Patient Instructions (Signed)
[facility] Discharge Instructions for Patients Receiving Chemotherapy  Today you received the following chemotherapy agents Herceptin and Perjeta.   To help prevent nausea and vomiting after your treatment, we encourage you to take your nausea medication as directed.   If you develop nausea and vomiting that is not controlled by your nausea medication, call the clinic.   BELOW ARE SYMPTOMS THAT SHOULD BE REPORTED IMMEDIATELY:  *FEVER GREATER THAN 100.5 F  *CHILLS WITH OR WITHOUT FEVER  NAUSEA AND VOMITING THAT IS NOT CONTROLLED WITH YOUR NAUSEA MEDICATION  *UNUSUAL SHORTNESS OF BREATH  *UNUSUAL BRUISING OR BLEEDING  TENDERNESS IN MOUTH AND THROAT WITH OR WITHOUT PRESENCE OF ULCERS  *URINARY PROBLEMS  *BOWEL PROBLEMS  UNUSUAL RASH Items with * indicate a potential emergency and should be followed up as soon as possible.  Feel free to call the clinic should you have any questions or concerns. The clinic phone number is [phone].  Please show the CHEMO ALERT CARD at check-in to the Emergency Department and triage nurse.   

## 2017-11-24 ENCOUNTER — Encounter: Payer: BLUE CROSS/BLUE SHIELD | Admitting: Physical Therapy

## 2017-11-24 ENCOUNTER — Telehealth: Payer: Self-pay | Admitting: Hematology and Oncology

## 2017-11-24 ENCOUNTER — Ambulatory Visit
Admission: RE | Admit: 2017-11-24 | Discharge: 2017-11-24 | Disposition: A | Discharge status: Home / Self Care | Payer: BLUE CROSS/BLUE SHIELD | Source: Ambulatory Visit | Attending: Radiation Oncology | Admitting: Radiation Oncology

## 2017-11-24 DIAGNOSIS — C50612 Malignant neoplasm of axillary tail of left female breast: Secondary | ICD-10-CM | POA: Diagnosis not present

## 2017-11-24 DIAGNOSIS — C773 Secondary and unspecified malignant neoplasm of axilla and upper limb lymph nodes: Secondary | ICD-10-CM

## 2017-11-24 DIAGNOSIS — Z170 Estrogen receptor positive status [ER+]: Principal | ICD-10-CM

## 2017-11-24 DIAGNOSIS — Z510 Encounter for antineoplastic radiation therapy: Secondary | ICD-10-CM | POA: Diagnosis not present

## 2017-11-24 DIAGNOSIS — Z79899 Other long term (current) drug therapy: Secondary | ICD-10-CM | POA: Diagnosis not present

## 2017-11-24 DIAGNOSIS — Z79891 Long term (current) use of opiate analgesic: Secondary | ICD-10-CM | POA: Diagnosis not present

## 2017-11-24 NOTE — Telephone Encounter (Signed)
Per 12/18 los. Lab appointment canceled.

## 2017-11-24 NOTE — Progress Notes (Signed)
Radiation Oncology         [phone] ________________________________  Name: Melissa [name] MRN: [id number]  Date: 11/24/2017  DOB: May 16, 1961  SIMULATION AND TREATMENT PLANNING NOTE   Special treatment procedure   Outpatient  DIAGNOSIS:     ICD-10-CM   1. 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, estrogen receptor positive ([place]) C50.612    Z17.0   2. 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  Identity was confirmed.  All relevant records and images related to the planned course of therapy were reviewed.  The patient freely provided informed written consent to proceed with treatment after reviewing the details related to the planned course of therapy. The consent form was witnessed and verified by the simulation staff.    Then, the patient was set-up in a stable reproducible supine position for radiation therapy with her ipsilateral arm over her head, and her upper body secured in a custom-made Vac-lok device.  CT images were obtained.  Surface markings were placed.  The CT images were loaded into the planning software.   Special treatment procedure:  Special treatment procedure was performed today due to the extra time and effort required by myself to plan and prepare this patient for deep inspiration breath hold technique.  I have determined cardiac sparing to be of benefit to this patient to prevent long term cardiac damage due to radiation of the heart.  Bellows were placed on the patient's abdomen. To facilitate cardiac sparing, the patient was coached by the radiation therapists on breath hold techniques and breathing practice was performed. Practice waveforms were obtained. The patient was then scanned while maintaining breath hold in the treatment position.  This image was then transferred over to the imaging specialist. The imaging  specialist then created a fusion of the free breathing and breath hold scans using the chest wall as the stable structure. I personally reviewed the fusion in axial, coronal and sagittal image planes.  Excellent cardiac sparing was obtained.  I felt the patient is an appropriate candidate for breath hold and the patient will be treated as such.  The image fusion was then reviewed with the patient to reinforce the necessity of reproducible breath hold.     TREATMENT PLANNING NOTE: Treatment planning then occurred.  The radiation prescription was entered and confirmed.     A total of 5 medically necessary complex treatment devices were fabricated and supervised by me: 4 fields with MLCs for custom blocks to protect heart, and lungs;  and, a Vac-lok. MORE COMPLEX DEVICES MAY BE MADE IN DOSIMETRY FOR FIELD IN FIELD BEAMS FOR DOSE HOMOGENEITY.  I have requested : 3D Simulation which is medically necessary to give adequate dose to at risk tissues while sparing lungs and heart.  I have requested a DVH of the following structures: lungs, heart, esophagus, cord.    The patient will receive 50 Gy in 25 fractions to the left breast and regional nodes with 4 fields.  This will not be followed by a boost.  Optical Surface Tracking Plan:  Since intensity modulated radiotherapy (IMRT) and 3D conformal radiation treatment methods are predicated on accurate and precise positioning for treatment, intrafraction motion monitoring is medically necessary to ensure accurate and safe treatment delivery. The ability to quantify intrafraction motion without excessive ionizing radiation dose can only be performed with optical  surface tracking. Accordingly, surface imaging offers the opportunity to obtain 3D measurements of patient position throughout IMRT and 3D treatments without excessive radiation exposure. I am ordering optical surface tracking for this patient's upcoming course of radiotherapy.    ________________________________   Reference:  Ursula Alert, J, et al. Surface imaging-based analysis of intrafraction motion for breast radiotherapy patients.Journal of [employer], n. 6, nov. 2014. ISSN [id number].  Available at: <http://www.jacmp.org/index.php/jacmp/article/view/4957>.    -----------------------------------  Eppie Gibson, MD

## 2017-11-26 DIAGNOSIS — Z79891 Long term (current) use of opiate analgesic: Secondary | ICD-10-CM | POA: Diagnosis not present

## 2017-11-26 DIAGNOSIS — Z79899 Other long term (current) drug therapy: Secondary | ICD-10-CM | POA: Diagnosis not present

## 2017-11-26 DIAGNOSIS — Z170 Estrogen receptor positive status [ER+]: Secondary | ICD-10-CM | POA: Diagnosis not present

## 2017-11-26 DIAGNOSIS — C50612 Malignant neoplasm of axillary tail of left female breast: Secondary | ICD-10-CM | POA: Diagnosis not present

## 2017-11-26 DIAGNOSIS — C773 Secondary and unspecified malignant neoplasm of axilla and upper limb lymph nodes: Secondary | ICD-10-CM | POA: Diagnosis not present

## 2017-11-26 DIAGNOSIS — Z510 Encounter for antineoplastic radiation therapy: Secondary | ICD-10-CM | POA: Diagnosis not present

## 2017-12-01 DIAGNOSIS — C50912 Malignant neoplasm of unspecified site of left female breast: Secondary | ICD-10-CM | POA: Diagnosis not present

## 2017-12-02 ENCOUNTER — Ambulatory Visit: Payer: BLUE CROSS/BLUE SHIELD | Admitting: Physical Therapy

## 2017-12-03 DIAGNOSIS — C50912 Malignant neoplasm of unspecified site of left female breast: Secondary | ICD-10-CM | POA: Diagnosis not present

## 2017-12-06 DIAGNOSIS — C50912 Malignant neoplasm of unspecified site of left female breast: Secondary | ICD-10-CM | POA: Diagnosis not present

## 2017-12-08 ENCOUNTER — Ambulatory Visit
Admission: RE | Admit: 2017-12-08 | Discharge: 2017-12-08 | Disposition: A | Discharge status: Home / Self Care | Payer: BLUE CROSS/BLUE SHIELD | Source: Ambulatory Visit | Attending: Radiation Oncology | Admitting: Radiation Oncology

## 2017-12-08 DIAGNOSIS — C50612 Malignant neoplasm of axillary tail of left female breast: Secondary | ICD-10-CM | POA: Diagnosis not present

## 2017-12-08 DIAGNOSIS — C773 Secondary and unspecified malignant neoplasm of axilla and upper limb lymph nodes: Secondary | ICD-10-CM | POA: Diagnosis not present

## 2017-12-08 DIAGNOSIS — Z510 Encounter for antineoplastic radiation therapy: Secondary | ICD-10-CM | POA: Diagnosis not present

## 2017-12-08 DIAGNOSIS — Z79899 Other long term (current) drug therapy: Secondary | ICD-10-CM | POA: Diagnosis not present

## 2017-12-08 DIAGNOSIS — Z79891 Long term (current) use of opiate analgesic: Secondary | ICD-10-CM | POA: Diagnosis not present

## 2017-12-08 DIAGNOSIS — Z170 Estrogen receptor positive status [ER+]: Secondary | ICD-10-CM | POA: Diagnosis not present

## 2017-12-09 ENCOUNTER — Ambulatory Visit
Admission: RE | Admit: 2017-12-09 | Discharge: 2017-12-09 | Disposition: A | Discharge status: Home / Self Care | Payer: BLUE CROSS/BLUE SHIELD | Source: Ambulatory Visit | Attending: Radiation Oncology | Admitting: Radiation Oncology

## 2017-12-09 DIAGNOSIS — C50612 Malignant neoplasm of axillary tail of left female breast: Secondary | ICD-10-CM

## 2017-12-09 DIAGNOSIS — Z79891 Long term (current) use of opiate analgesic: Secondary | ICD-10-CM | POA: Diagnosis not present

## 2017-12-09 DIAGNOSIS — Z170 Estrogen receptor positive status [ER+]: Secondary | ICD-10-CM | POA: Diagnosis not present

## 2017-12-09 DIAGNOSIS — Z510 Encounter for antineoplastic radiation therapy: Secondary | ICD-10-CM | POA: Diagnosis not present

## 2017-12-09 DIAGNOSIS — C773 Secondary and unspecified malignant neoplasm of axilla and upper limb lymph nodes: Secondary | ICD-10-CM | POA: Diagnosis not present

## 2017-12-09 DIAGNOSIS — Z79899 Other long term (current) drug therapy: Secondary | ICD-10-CM | POA: Diagnosis not present

## 2017-12-09 MED ORDER — RADIAPLEXRX EX GEL
Freq: Once | CUTANEOUS | Status: AC
  Administered 2017-12-09: 12:00:00 via TOPICAL

## 2017-12-09 MED ORDER — ALRA NON-METALLIC DEODORANT (RAD-ONC)
1.0000 "application " | Freq: Once | TOPICAL | Status: AC
  Administered 2017-12-09: 1 via TOPICAL

## 2017-12-09 NOTE — Progress Notes (Signed)

## 2017-12-10 ENCOUNTER — Ambulatory Visit
Admission: RE | Admit: 2017-12-10 | Discharge: 2017-12-10 | Disposition: A | Discharge status: Home / Self Care | Payer: BLUE CROSS/BLUE SHIELD | Source: Ambulatory Visit | Attending: Radiation Oncology | Admitting: Radiation Oncology

## 2017-12-10 DIAGNOSIS — C773 Secondary and unspecified malignant neoplasm of axilla and upper limb lymph nodes: Secondary | ICD-10-CM | POA: Diagnosis not present

## 2017-12-10 DIAGNOSIS — Z79899 Other long term (current) drug therapy: Secondary | ICD-10-CM | POA: Diagnosis not present

## 2017-12-10 DIAGNOSIS — Z510 Encounter for antineoplastic radiation therapy: Secondary | ICD-10-CM | POA: Diagnosis not present

## 2017-12-10 DIAGNOSIS — C50612 Malignant neoplasm of axillary tail of left female breast: Secondary | ICD-10-CM | POA: Diagnosis not present

## 2017-12-10 DIAGNOSIS — Z170 Estrogen receptor positive status [ER+]: Secondary | ICD-10-CM | POA: Diagnosis not present

## 2017-12-10 DIAGNOSIS — Z79891 Long term (current) use of opiate analgesic: Secondary | ICD-10-CM | POA: Diagnosis not present

## 2017-12-13 ENCOUNTER — Ambulatory Visit
Admission: RE | Admit: 2017-12-13 | Discharge: 2017-12-13 | Disposition: A | Discharge status: Home / Self Care | Payer: BLUE CROSS/BLUE SHIELD | Source: Ambulatory Visit | Attending: Radiation Oncology | Admitting: Radiation Oncology

## 2017-12-13 DIAGNOSIS — Z79899 Other long term (current) drug therapy: Secondary | ICD-10-CM | POA: Diagnosis not present

## 2017-12-13 DIAGNOSIS — Z170 Estrogen receptor positive status [ER+]: Secondary | ICD-10-CM | POA: Diagnosis not present

## 2017-12-13 DIAGNOSIS — C50612 Malignant neoplasm of axillary tail of left female breast: Secondary | ICD-10-CM | POA: Diagnosis not present

## 2017-12-13 DIAGNOSIS — C773 Secondary and unspecified malignant neoplasm of axilla and upper limb lymph nodes: Secondary | ICD-10-CM | POA: Diagnosis not present

## 2017-12-13 DIAGNOSIS — Z510 Encounter for antineoplastic radiation therapy: Secondary | ICD-10-CM | POA: Diagnosis not present

## 2017-12-13 DIAGNOSIS — Z79891 Long term (current) use of opiate analgesic: Secondary | ICD-10-CM | POA: Diagnosis not present

## 2017-12-14 ENCOUNTER — Inpatient Hospital Stay: Payer: BLUE CROSS/BLUE SHIELD

## 2017-12-14 ENCOUNTER — Ambulatory Visit
Admission: RE | Admit: 2017-12-14 | Discharge: 2017-12-14 | Disposition: A | Discharge status: Home / Self Care | Payer: BLUE CROSS/BLUE SHIELD | Source: Ambulatory Visit | Attending: Radiation Oncology | Admitting: Radiation Oncology

## 2017-12-14 ENCOUNTER — Other Ambulatory Visit: Payer: BLUE CROSS/BLUE SHIELD

## 2017-12-14 ENCOUNTER — Inpatient Hospital Stay: Payer: BLUE CROSS/BLUE SHIELD | Attending: Hematology and Oncology

## 2017-12-14 DIAGNOSIS — C50612 Malignant neoplasm of axillary tail of left female breast: Secondary | ICD-10-CM | POA: Diagnosis not present

## 2017-12-14 DIAGNOSIS — Z95828 Presence of other vascular implants and grafts: Secondary | ICD-10-CM

## 2017-12-14 DIAGNOSIS — Z79891 Long term (current) use of opiate analgesic: Secondary | ICD-10-CM | POA: Diagnosis not present

## 2017-12-14 DIAGNOSIS — Z5112 Encounter for antineoplastic immunotherapy: Secondary | ICD-10-CM | POA: Insufficient documentation

## 2017-12-14 DIAGNOSIS — Z170 Estrogen receptor positive status [ER+]: Secondary | ICD-10-CM | POA: Insufficient documentation

## 2017-12-14 DIAGNOSIS — Z79899 Other long term (current) drug therapy: Secondary | ICD-10-CM | POA: Diagnosis not present

## 2017-12-14 DIAGNOSIS — Z510 Encounter for antineoplastic radiation therapy: Secondary | ICD-10-CM | POA: Diagnosis not present

## 2017-12-14 DIAGNOSIS — C773 Secondary and unspecified malignant neoplasm of axilla and upper limb lymph nodes: Secondary | ICD-10-CM | POA: Insufficient documentation

## 2017-12-14 DIAGNOSIS — C801 Malignant (primary) neoplasm, unspecified: Secondary | ICD-10-CM | POA: Diagnosis not present

## 2017-12-14 MED ORDER — SODIUM CHLORIDE 0.9 % IV SOLN
420.0000 mg | Freq: Once | INTRAVENOUS | Status: AC
  Administered 2017-12-14: 420 mg via INTRAVENOUS
  Filled 2017-12-14: qty 14

## 2017-12-14 MED ORDER — SODIUM CHLORIDE 0.9 % IV SOLN
Freq: Once | INTRAVENOUS | Status: AC
  Administered 2017-12-14: 12:00:00 via INTRAVENOUS

## 2017-12-14 MED ORDER — ACETAMINOPHEN 325 MG PO TABS
ORAL_TABLET | ORAL | Status: AC
  Filled 2017-12-14: qty 2

## 2017-12-14 MED ORDER — HEPARIN SOD (PORK) LOCK FLUSH 100 UNIT/ML IV SOLN
500.0000 [IU] | Freq: Once | INTRAVENOUS | Status: AC | PRN
  Administered 2017-12-14: 500 [IU]
  Filled 2017-12-14: qty 5

## 2017-12-14 MED ORDER — TRASTUZUMAB CHEMO 150 MG IV SOLR
450.0000 mg | Freq: Once | INTRAVENOUS | Status: AC
  Administered 2017-12-14: 450 mg via INTRAVENOUS
  Filled 2017-12-14: qty 21.43

## 2017-12-14 MED ORDER — SODIUM CHLORIDE 0.9% FLUSH
10.0000 mL | INTRAVENOUS | Status: DC | PRN
  Filled 2017-12-14: qty 10

## 2017-12-14 MED ORDER — DIPHENHYDRAMINE HCL 25 MG PO CAPS
ORAL_CAPSULE | ORAL | Status: AC
Start: 2017-12-14 — End: 2017-12-14
  Filled 2017-12-14: qty 2

## 2017-12-14 MED ORDER — DIPHENHYDRAMINE HCL 25 MG PO CAPS
50.0000 mg | ORAL_CAPSULE | Freq: Once | ORAL | Status: AC
  Administered 2017-12-14: 50 mg via ORAL

## 2017-12-14 MED ORDER — ACETAMINOPHEN 325 MG PO TABS
650.0000 mg | ORAL_TABLET | Freq: Once | ORAL | Status: AC
  Administered 2017-12-14: 650 mg via ORAL

## 2017-12-14 MED ORDER — SODIUM CHLORIDE 0.9% FLUSH
10.0000 mL | INTRAVENOUS | Status: DC | PRN
  Administered 2017-12-14: 10 mL
  Filled 2017-12-14: qty 10

## 2017-12-14 NOTE — Patient Instructions (Signed)
[facility] Discharge Instructions for Patients Receiving Chemotherapy  Today you received the following chemotherapy agents Herceptin and Perjeta.   To help prevent nausea and vomiting after your treatment, we encourage you to take your nausea medication as directed.   If you develop nausea and vomiting that is not controlled by your nausea medication, call the clinic.   BELOW ARE SYMPTOMS THAT SHOULD BE REPORTED IMMEDIATELY:  *FEVER GREATER THAN 100.5 F  *CHILLS WITH OR WITHOUT FEVER  NAUSEA AND VOMITING THAT IS NOT CONTROLLED WITH YOUR NAUSEA MEDICATION  *UNUSUAL SHORTNESS OF BREATH  *UNUSUAL BRUISING OR BLEEDING  TENDERNESS IN MOUTH AND THROAT WITH OR WITHOUT PRESENCE OF ULCERS  *URINARY PROBLEMS  *BOWEL PROBLEMS  UNUSUAL RASH Items with * indicate a potential emergency and should be followed up as soon as possible.  Feel free to call the clinic should you have any questions or concerns. The clinic phone number is [phone].  Please show the CHEMO ALERT CARD at check-in to the Emergency Department and triage nurse.   

## 2017-12-15 ENCOUNTER — Ambulatory Visit
Admission: RE | Admit: 2017-12-15 | Discharge: 2017-12-15 | Disposition: A | Discharge status: Home / Self Care | Payer: BLUE CROSS/BLUE SHIELD | Source: Ambulatory Visit | Attending: Radiation Oncology | Admitting: Radiation Oncology

## 2017-12-15 DIAGNOSIS — Z170 Estrogen receptor positive status [ER+]: Secondary | ICD-10-CM | POA: Diagnosis not present

## 2017-12-15 DIAGNOSIS — C773 Secondary and unspecified malignant neoplasm of axilla and upper limb lymph nodes: Secondary | ICD-10-CM | POA: Diagnosis not present

## 2017-12-15 DIAGNOSIS — Z510 Encounter for antineoplastic radiation therapy: Secondary | ICD-10-CM | POA: Diagnosis not present

## 2017-12-15 DIAGNOSIS — Z79899 Other long term (current) drug therapy: Secondary | ICD-10-CM | POA: Diagnosis not present

## 2017-12-15 DIAGNOSIS — C50612 Malignant neoplasm of axillary tail of left female breast: Secondary | ICD-10-CM | POA: Diagnosis not present

## 2017-12-15 DIAGNOSIS — Z79891 Long term (current) use of opiate analgesic: Secondary | ICD-10-CM | POA: Diagnosis not present

## 2017-12-16 ENCOUNTER — Ambulatory Visit
Admission: RE | Admit: 2017-12-16 | Discharge: 2017-12-16 | Disposition: A | Discharge status: Home / Self Care | Payer: BLUE CROSS/BLUE SHIELD | Source: Ambulatory Visit | Attending: Radiation Oncology | Admitting: Radiation Oncology

## 2017-12-16 DIAGNOSIS — Z79899 Other long term (current) drug therapy: Secondary | ICD-10-CM | POA: Diagnosis not present

## 2017-12-16 DIAGNOSIS — C773 Secondary and unspecified malignant neoplasm of axilla and upper limb lymph nodes: Secondary | ICD-10-CM | POA: Diagnosis not present

## 2017-12-16 DIAGNOSIS — Z510 Encounter for antineoplastic radiation therapy: Secondary | ICD-10-CM | POA: Diagnosis not present

## 2017-12-16 DIAGNOSIS — Z170 Estrogen receptor positive status [ER+]: Secondary | ICD-10-CM | POA: Diagnosis not present

## 2017-12-16 DIAGNOSIS — Z79891 Long term (current) use of opiate analgesic: Secondary | ICD-10-CM | POA: Diagnosis not present

## 2017-12-16 DIAGNOSIS — C50612 Malignant neoplasm of axillary tail of left female breast: Secondary | ICD-10-CM | POA: Diagnosis not present

## 2017-12-17 ENCOUNTER — Ambulatory Visit
Admission: RE | Admit: 2017-12-17 | Discharge: 2017-12-17 | Disposition: A | Discharge status: Home / Self Care | Payer: BLUE CROSS/BLUE SHIELD | Source: Ambulatory Visit | Attending: Radiation Oncology | Admitting: Radiation Oncology

## 2017-12-17 DIAGNOSIS — Z170 Estrogen receptor positive status [ER+]: Secondary | ICD-10-CM | POA: Diagnosis not present

## 2017-12-17 DIAGNOSIS — C773 Secondary and unspecified malignant neoplasm of axilla and upper limb lymph nodes: Secondary | ICD-10-CM | POA: Diagnosis not present

## 2017-12-17 DIAGNOSIS — C50612 Malignant neoplasm of axillary tail of left female breast: Secondary | ICD-10-CM | POA: Diagnosis not present

## 2017-12-17 DIAGNOSIS — Z510 Encounter for antineoplastic radiation therapy: Secondary | ICD-10-CM | POA: Diagnosis not present

## 2017-12-17 DIAGNOSIS — Z79891 Long term (current) use of opiate analgesic: Secondary | ICD-10-CM | POA: Diagnosis not present

## 2017-12-17 DIAGNOSIS — Z79899 Other long term (current) drug therapy: Secondary | ICD-10-CM | POA: Diagnosis not present

## 2017-12-20 ENCOUNTER — Ambulatory Visit
Admission: RE | Admit: 2017-12-20 | Discharge: 2017-12-20 | Disposition: A | Discharge status: Home / Self Care | Payer: BLUE CROSS/BLUE SHIELD | Source: Ambulatory Visit | Attending: Radiation Oncology | Admitting: Radiation Oncology

## 2017-12-20 DIAGNOSIS — C50612 Malignant neoplasm of axillary tail of left female breast: Secondary | ICD-10-CM | POA: Diagnosis not present

## 2017-12-20 DIAGNOSIS — Z79891 Long term (current) use of opiate analgesic: Secondary | ICD-10-CM | POA: Diagnosis not present

## 2017-12-20 DIAGNOSIS — Z79899 Other long term (current) drug therapy: Secondary | ICD-10-CM | POA: Diagnosis not present

## 2017-12-20 DIAGNOSIS — Z510 Encounter for antineoplastic radiation therapy: Secondary | ICD-10-CM | POA: Diagnosis not present

## 2017-12-20 DIAGNOSIS — Z170 Estrogen receptor positive status [ER+]: Secondary | ICD-10-CM | POA: Diagnosis not present

## 2017-12-20 DIAGNOSIS — C773 Secondary and unspecified malignant neoplasm of axilla and upper limb lymph nodes: Secondary | ICD-10-CM | POA: Diagnosis not present

## 2017-12-21 ENCOUNTER — Ambulatory Visit
Admission: RE | Admit: 2017-12-21 | Discharge: 2017-12-21 | Disposition: A | Discharge status: Home / Self Care | Payer: BLUE CROSS/BLUE SHIELD | Source: Ambulatory Visit | Attending: Radiation Oncology | Admitting: Radiation Oncology

## 2017-12-21 DIAGNOSIS — Z510 Encounter for antineoplastic radiation therapy: Secondary | ICD-10-CM | POA: Diagnosis not present

## 2017-12-21 DIAGNOSIS — C773 Secondary and unspecified malignant neoplasm of axilla and upper limb lymph nodes: Secondary | ICD-10-CM | POA: Diagnosis not present

## 2017-12-21 DIAGNOSIS — Z79891 Long term (current) use of opiate analgesic: Secondary | ICD-10-CM | POA: Diagnosis not present

## 2017-12-21 DIAGNOSIS — Z79899 Other long term (current) drug therapy: Secondary | ICD-10-CM | POA: Diagnosis not present

## 2017-12-21 DIAGNOSIS — C50612 Malignant neoplasm of axillary tail of left female breast: Secondary | ICD-10-CM | POA: Diagnosis not present

## 2017-12-21 DIAGNOSIS — Z170 Estrogen receptor positive status [ER+]: Secondary | ICD-10-CM | POA: Diagnosis not present

## 2017-12-22 ENCOUNTER — Ambulatory Visit
Admission: RE | Admit: 2017-12-22 | Discharge: 2017-12-22 | Disposition: A | Discharge status: Home / Self Care | Payer: BLUE CROSS/BLUE SHIELD | Source: Ambulatory Visit | Attending: Radiation Oncology | Admitting: Radiation Oncology

## 2017-12-22 DIAGNOSIS — Z79891 Long term (current) use of opiate analgesic: Secondary | ICD-10-CM | POA: Diagnosis not present

## 2017-12-22 DIAGNOSIS — C773 Secondary and unspecified malignant neoplasm of axilla and upper limb lymph nodes: Secondary | ICD-10-CM | POA: Diagnosis not present

## 2017-12-22 DIAGNOSIS — Z170 Estrogen receptor positive status [ER+]: Secondary | ICD-10-CM | POA: Diagnosis not present

## 2017-12-22 DIAGNOSIS — C50612 Malignant neoplasm of axillary tail of left female breast: Secondary | ICD-10-CM | POA: Diagnosis not present

## 2017-12-22 DIAGNOSIS — Z79899 Other long term (current) drug therapy: Secondary | ICD-10-CM | POA: Diagnosis not present

## 2017-12-22 DIAGNOSIS — Z510 Encounter for antineoplastic radiation therapy: Secondary | ICD-10-CM | POA: Diagnosis not present

## 2017-12-23 ENCOUNTER — Ambulatory Visit
Admission: RE | Admit: 2017-12-23 | Discharge: 2017-12-23 | Disposition: A | Discharge status: Home / Self Care | Payer: BLUE CROSS/BLUE SHIELD | Source: Ambulatory Visit | Attending: Radiation Oncology | Admitting: Radiation Oncology

## 2017-12-23 DIAGNOSIS — Z510 Encounter for antineoplastic radiation therapy: Secondary | ICD-10-CM | POA: Diagnosis not present

## 2017-12-23 DIAGNOSIS — Z170 Estrogen receptor positive status [ER+]: Secondary | ICD-10-CM | POA: Diagnosis not present

## 2017-12-23 DIAGNOSIS — Z79891 Long term (current) use of opiate analgesic: Secondary | ICD-10-CM | POA: Diagnosis not present

## 2017-12-23 DIAGNOSIS — C50612 Malignant neoplasm of axillary tail of left female breast: Secondary | ICD-10-CM | POA: Diagnosis not present

## 2017-12-23 DIAGNOSIS — Z79899 Other long term (current) drug therapy: Secondary | ICD-10-CM | POA: Diagnosis not present

## 2017-12-23 DIAGNOSIS — C773 Secondary and unspecified malignant neoplasm of axilla and upper limb lymph nodes: Secondary | ICD-10-CM | POA: Diagnosis not present

## 2017-12-24 ENCOUNTER — Ambulatory Visit
Admission: RE | Admit: 2017-12-24 | Discharge: 2017-12-24 | Disposition: A | Discharge status: Home / Self Care | Payer: BLUE CROSS/BLUE SHIELD | Source: Ambulatory Visit | Attending: Radiation Oncology | Admitting: Radiation Oncology

## 2017-12-24 DIAGNOSIS — Z170 Estrogen receptor positive status [ER+]: Secondary | ICD-10-CM | POA: Diagnosis not present

## 2017-12-24 DIAGNOSIS — C773 Secondary and unspecified malignant neoplasm of axilla and upper limb lymph nodes: Secondary | ICD-10-CM | POA: Diagnosis not present

## 2017-12-24 DIAGNOSIS — Z510 Encounter for antineoplastic radiation therapy: Secondary | ICD-10-CM | POA: Diagnosis not present

## 2017-12-24 DIAGNOSIS — Z79891 Long term (current) use of opiate analgesic: Secondary | ICD-10-CM | POA: Diagnosis not present

## 2017-12-24 DIAGNOSIS — Z79899 Other long term (current) drug therapy: Secondary | ICD-10-CM | POA: Diagnosis not present

## 2017-12-24 DIAGNOSIS — C50612 Malignant neoplasm of axillary tail of left female breast: Secondary | ICD-10-CM | POA: Diagnosis not present

## 2017-12-27 ENCOUNTER — Ambulatory Visit
Admission: RE | Admit: 2017-12-27 | Discharge: 2017-12-27 | Disposition: A | Discharge status: Home / Self Care | Payer: BLUE CROSS/BLUE SHIELD | Source: Ambulatory Visit | Attending: Radiation Oncology | Admitting: Radiation Oncology

## 2017-12-27 DIAGNOSIS — Z79891 Long term (current) use of opiate analgesic: Secondary | ICD-10-CM | POA: Diagnosis not present

## 2017-12-27 DIAGNOSIS — Z170 Estrogen receptor positive status [ER+]: Secondary | ICD-10-CM | POA: Diagnosis not present

## 2017-12-27 DIAGNOSIS — C50612 Malignant neoplasm of axillary tail of left female breast: Secondary | ICD-10-CM | POA: Diagnosis not present

## 2017-12-27 DIAGNOSIS — C773 Secondary and unspecified malignant neoplasm of axilla and upper limb lymph nodes: Secondary | ICD-10-CM | POA: Diagnosis not present

## 2017-12-27 DIAGNOSIS — Z510 Encounter for antineoplastic radiation therapy: Secondary | ICD-10-CM | POA: Diagnosis not present

## 2017-12-27 DIAGNOSIS — Z79899 Other long term (current) drug therapy: Secondary | ICD-10-CM | POA: Diagnosis not present

## 2017-12-28 ENCOUNTER — Ambulatory Visit
Admission: RE | Admit: 2017-12-28 | Discharge: 2017-12-28 | Disposition: A | Discharge status: Home / Self Care | Payer: BLUE CROSS/BLUE SHIELD | Source: Ambulatory Visit | Attending: Radiation Oncology | Admitting: Radiation Oncology

## 2017-12-28 DIAGNOSIS — Z170 Estrogen receptor positive status [ER+]: Secondary | ICD-10-CM | POA: Diagnosis not present

## 2017-12-28 DIAGNOSIS — Z510 Encounter for antineoplastic radiation therapy: Secondary | ICD-10-CM | POA: Diagnosis not present

## 2017-12-28 DIAGNOSIS — Z79891 Long term (current) use of opiate analgesic: Secondary | ICD-10-CM | POA: Diagnosis not present

## 2017-12-28 DIAGNOSIS — C50612 Malignant neoplasm of axillary tail of left female breast: Secondary | ICD-10-CM | POA: Diagnosis not present

## 2017-12-28 DIAGNOSIS — C773 Secondary and unspecified malignant neoplasm of axilla and upper limb lymph nodes: Secondary | ICD-10-CM | POA: Diagnosis not present

## 2017-12-28 DIAGNOSIS — Z79899 Other long term (current) drug therapy: Secondary | ICD-10-CM | POA: Diagnosis not present

## 2017-12-29 ENCOUNTER — Ambulatory Visit
Admission: RE | Admit: 2017-12-29 | Discharge: 2017-12-29 | Disposition: A | Discharge status: Home / Self Care | Payer: BLUE CROSS/BLUE SHIELD | Source: Ambulatory Visit | Attending: Radiation Oncology | Admitting: Radiation Oncology

## 2017-12-29 DIAGNOSIS — C50612 Malignant neoplasm of axillary tail of left female breast: Secondary | ICD-10-CM | POA: Diagnosis not present

## 2017-12-29 DIAGNOSIS — C773 Secondary and unspecified malignant neoplasm of axilla and upper limb lymph nodes: Secondary | ICD-10-CM | POA: Diagnosis not present

## 2017-12-29 DIAGNOSIS — Z79891 Long term (current) use of opiate analgesic: Secondary | ICD-10-CM | POA: Diagnosis not present

## 2017-12-29 DIAGNOSIS — Z510 Encounter for antineoplastic radiation therapy: Secondary | ICD-10-CM | POA: Diagnosis not present

## 2017-12-29 DIAGNOSIS — Z170 Estrogen receptor positive status [ER+]: Secondary | ICD-10-CM | POA: Diagnosis not present

## 2017-12-29 DIAGNOSIS — Z79899 Other long term (current) drug therapy: Secondary | ICD-10-CM | POA: Diagnosis not present

## 2017-12-30 ENCOUNTER — Ambulatory Visit
Admission: RE | Admit: 2017-12-30 | Discharge: 2017-12-30 | Disposition: A | Discharge status: Home / Self Care | Payer: BLUE CROSS/BLUE SHIELD | Source: Ambulatory Visit | Attending: Radiation Oncology | Admitting: Radiation Oncology

## 2017-12-30 DIAGNOSIS — Z79899 Other long term (current) drug therapy: Secondary | ICD-10-CM | POA: Diagnosis not present

## 2017-12-30 DIAGNOSIS — C773 Secondary and unspecified malignant neoplasm of axilla and upper limb lymph nodes: Secondary | ICD-10-CM | POA: Diagnosis not present

## 2017-12-30 DIAGNOSIS — Z170 Estrogen receptor positive status [ER+]: Secondary | ICD-10-CM | POA: Diagnosis not present

## 2017-12-30 DIAGNOSIS — Z79891 Long term (current) use of opiate analgesic: Secondary | ICD-10-CM | POA: Diagnosis not present

## 2017-12-30 DIAGNOSIS — C50612 Malignant neoplasm of axillary tail of left female breast: Secondary | ICD-10-CM | POA: Diagnosis not present

## 2017-12-30 DIAGNOSIS — Z510 Encounter for antineoplastic radiation therapy: Secondary | ICD-10-CM | POA: Diagnosis not present

## 2017-12-31 ENCOUNTER — Ambulatory Visit
Admission: RE | Admit: 2017-12-31 | Discharge: 2017-12-31 | Disposition: A | Discharge status: Home / Self Care | Payer: BLUE CROSS/BLUE SHIELD | Source: Ambulatory Visit | Attending: Radiation Oncology | Admitting: Radiation Oncology

## 2017-12-31 DIAGNOSIS — Z170 Estrogen receptor positive status [ER+]: Secondary | ICD-10-CM | POA: Diagnosis not present

## 2017-12-31 DIAGNOSIS — Z510 Encounter for antineoplastic radiation therapy: Secondary | ICD-10-CM | POA: Diagnosis not present

## 2017-12-31 DIAGNOSIS — C773 Secondary and unspecified malignant neoplasm of axilla and upper limb lymph nodes: Secondary | ICD-10-CM | POA: Diagnosis not present

## 2017-12-31 DIAGNOSIS — Z79891 Long term (current) use of opiate analgesic: Secondary | ICD-10-CM | POA: Diagnosis not present

## 2017-12-31 DIAGNOSIS — C50612 Malignant neoplasm of axillary tail of left female breast: Secondary | ICD-10-CM | POA: Diagnosis not present

## 2017-12-31 DIAGNOSIS — Z79899 Other long term (current) drug therapy: Secondary | ICD-10-CM | POA: Diagnosis not present

## 2018-01-03 ENCOUNTER — Other Ambulatory Visit: Payer: Self-pay

## 2018-01-03 ENCOUNTER — Ambulatory Visit
Admission: RE | Admit: 2018-01-03 | Discharge: 2018-01-03 | Disposition: A | Discharge status: Home / Self Care | Payer: BLUE CROSS/BLUE SHIELD | Source: Ambulatory Visit | Attending: Radiation Oncology | Admitting: Radiation Oncology

## 2018-01-03 DIAGNOSIS — C50612 Malignant neoplasm of axillary tail of left female breast: Secondary | ICD-10-CM | POA: Diagnosis not present

## 2018-01-03 DIAGNOSIS — C773 Secondary and unspecified malignant neoplasm of axilla and upper limb lymph nodes: Secondary | ICD-10-CM | POA: Diagnosis not present

## 2018-01-03 DIAGNOSIS — Z79891 Long term (current) use of opiate analgesic: Secondary | ICD-10-CM | POA: Diagnosis not present

## 2018-01-03 DIAGNOSIS — Z170 Estrogen receptor positive status [ER+]: Secondary | ICD-10-CM | POA: Diagnosis not present

## 2018-01-03 DIAGNOSIS — Z510 Encounter for antineoplastic radiation therapy: Secondary | ICD-10-CM | POA: Diagnosis not present

## 2018-01-03 DIAGNOSIS — Z79899 Other long term (current) drug therapy: Secondary | ICD-10-CM | POA: Diagnosis not present

## 2018-01-03 NOTE — Assessment & Plan Note (Signed)
04/14/2017 Left axillary lymph node biopsy: Metastatic carcinoma positive for CK 7, ER positive,GATA-3 equivocal, negative for CK 20,GCDFP, CDX-2, TTF-1; HER-2 positive ratio 2.08 mammogram and ultrasound revealed 3 discrete abnormal left axillary lymph nodes largest measures 1.8 cm Clinical stage: TX N1MX  Treatment plan: 1. Neoadjuvant TCH Perjeta 6 cycles completed 08/31/2017 followed by Herceptin, Perjeta maintenance for 1 year 2. followed by axillary lymph node dissection. Breast surgery will not be performed because there is no primary tumor identifiable on breast MRI: 10/14/17: 0/13 LN Negative 3. Followed by radiation 4. Followed by antiestrogen therapy ------------------------------------------------------------------------------------------------------------------ Current Treatment:Herceptin Perjeta maintenance therapy for 1 year Toxicities:  Continue with maintenance therapy with Herceptin and Perjeta every 3 weeks F/U with me in 6 weeks

## 2018-01-04 ENCOUNTER — Inpatient Hospital Stay: Payer: BLUE CROSS/BLUE SHIELD

## 2018-01-04 ENCOUNTER — Inpatient Hospital Stay (HOSPITAL_BASED_OUTPATIENT_CLINIC_OR_DEPARTMENT_OTHER): Payer: BLUE CROSS/BLUE SHIELD | Admitting: Hematology and Oncology

## 2018-01-04 ENCOUNTER — Telehealth: Payer: Self-pay | Admitting: Hematology and Oncology

## 2018-01-04 ENCOUNTER — Ambulatory Visit
Admission: RE | Admit: 2018-01-04 | Discharge: 2018-01-04 | Disposition: A | Discharge status: Home / Self Care | Payer: BLUE CROSS/BLUE SHIELD | Source: Ambulatory Visit | Attending: Radiation Oncology | Admitting: Radiation Oncology

## 2018-01-04 ENCOUNTER — Encounter: Payer: Self-pay | Admitting: *Deleted

## 2018-01-04 VITALS — BP 134/74 | HR 81 | Temp 97.9°F | Resp 18

## 2018-01-04 DIAGNOSIS — C773 Secondary and unspecified malignant neoplasm of axilla and upper limb lymph nodes: Secondary | ICD-10-CM

## 2018-01-04 DIAGNOSIS — Z95828 Presence of other vascular implants and grafts: Secondary | ICD-10-CM

## 2018-01-04 DIAGNOSIS — C801 Malignant (primary) neoplasm, unspecified: Secondary | ICD-10-CM | POA: Diagnosis not present

## 2018-01-04 DIAGNOSIS — Z79891 Long term (current) use of opiate analgesic: Secondary | ICD-10-CM | POA: Diagnosis not present

## 2018-01-04 DIAGNOSIS — Z5112 Encounter for antineoplastic immunotherapy: Secondary | ICD-10-CM | POA: Diagnosis not present

## 2018-01-04 DIAGNOSIS — Z170 Estrogen receptor positive status [ER+]: Principal | ICD-10-CM

## 2018-01-04 DIAGNOSIS — C50612 Malignant neoplasm of axillary tail of left female breast: Secondary | ICD-10-CM | POA: Diagnosis not present

## 2018-01-04 DIAGNOSIS — Z79899 Other long term (current) drug therapy: Secondary | ICD-10-CM | POA: Diagnosis not present

## 2018-01-04 DIAGNOSIS — Z510 Encounter for antineoplastic radiation therapy: Secondary | ICD-10-CM | POA: Diagnosis not present

## 2018-01-04 LAB — CBC WITH DIFFERENTIAL (CANCER CENTER ONLY)
Basophils Absolute: 0 10*3/uL (ref 0.0–0.1)
Basophils Relative: 1 %
Eosinophils Absolute: 0.1 10*3/uL (ref 0.0–0.5)
Eosinophils Relative: 2 %
HCT: 38 % (ref 34.8–46.6)
HEMOGLOBIN: 13 g/dL (ref 11.6–15.9)
LYMPHS PCT: 18 %
Lymphs Abs: 0.8 10*3/uL — ABNORMAL LOW (ref 0.9–3.3)
MCH: 30.6 pg (ref 25.1–34.0)
MCHC: 34.1 g/dL (ref 31.5–36.0)
MCV: 89.7 fL (ref 79.5–101.0)
Monocytes Absolute: 0.3 10*3/uL (ref 0.1–0.9)
Monocytes Relative: 7 %
NEUTROS ABS: 3.4 10*3/uL (ref 1.5–6.5)
Neutrophils Relative %: 72 %
Platelet Count: 264 10*3/uL (ref 145–400)
RBC: 4.24 MIL/uL (ref 3.70–5.45)
RDW: 12.4 % (ref 11.2–16.1)
WBC Count: 4.6 10*3/uL (ref 3.9–10.3)

## 2018-01-04 LAB — CMP (CANCER CENTER ONLY)
ALBUMIN: 4.3 g/dL (ref 3.5–5.0)
ALK PHOS: 79 U/L (ref 40–150)
ALT: 19 U/L (ref 0–55)
ANION GAP: 11 (ref 3–11)
AST: 22 U/L (ref 5–34)
BILIRUBIN TOTAL: 0.3 mg/dL (ref 0.2–1.2)
BUN: 17 mg/dL (ref 7–26)
CALCIUM: 10.2 mg/dL (ref 8.4–10.4)
CO2: 29 mmol/L (ref 22–29)
CREATININE: 0.88 mg/dL (ref 0.60–1.10)
Chloride: 100 mmol/L (ref 98–109)
GFR, Est AFR Am: >60 mL/min (ref >60)
GFR, Estimated: >60 mL/min (ref >60)
GLUCOSE: 97 mg/dL (ref 70–140)
Potassium: 3.8 mmol/L (ref 3.3–4.7)
SODIUM: 140 mmol/L (ref 136–145)
TOTAL PROTEIN: 7.6 g/dL (ref 6.4–8.3)

## 2018-01-04 MED ORDER — SODIUM CHLORIDE 0.9 % IV SOLN
420.0000 mg | Freq: Once | INTRAVENOUS | Status: AC
  Administered 2018-01-04: 420 mg via INTRAVENOUS
  Filled 2018-01-04: qty 14

## 2018-01-04 MED ORDER — SODIUM CHLORIDE 0.9 % IV SOLN
Freq: Once | INTRAVENOUS | Status: AC
  Administered 2018-01-04: 12:00:00 via INTRAVENOUS

## 2018-01-04 MED ORDER — HEPARIN SOD (PORK) LOCK FLUSH 100 UNIT/ML IV SOLN
500.0000 [IU] | Freq: Once | INTRAVENOUS | Status: AC | PRN
  Administered 2018-01-04: 500 [IU]
  Filled 2018-01-04: qty 5

## 2018-01-04 MED ORDER — TRASTUZUMAB CHEMO 150 MG IV SOLR
378.0000 mg | Freq: Once | INTRAVENOUS | Status: AC
  Administered 2018-01-04: 378 mg via INTRAVENOUS
  Filled 2018-01-04: qty 18

## 2018-01-04 MED ORDER — DIPHENHYDRAMINE HCL 25 MG PO CAPS
ORAL_CAPSULE | ORAL | Status: AC
  Filled 2018-01-04: qty 2

## 2018-01-04 MED ORDER — ACETAMINOPHEN 325 MG PO TABS
ORAL_TABLET | ORAL | Status: AC
  Filled 2018-01-04: qty 2

## 2018-01-04 MED ORDER — SODIUM CHLORIDE 0.9% FLUSH
10.0000 mL | INTRAVENOUS | Status: DC | PRN
  Administered 2018-01-04: 10 mL
  Filled 2018-01-04: qty 10

## 2018-01-04 MED ORDER — SODIUM CHLORIDE 0.9% FLUSH
10.0000 mL | INTRAVENOUS | Status: DC | PRN
  Administered 2018-01-04: 10 mL via INTRAVENOUS
  Filled 2018-01-04: qty 10

## 2018-01-04 MED ORDER — ACETAMINOPHEN 325 MG PO TABS
650.0000 mg | ORAL_TABLET | Freq: Once | ORAL | Status: AC
  Administered 2018-01-04: 650 mg via ORAL

## 2018-01-04 MED ORDER — DIPHENHYDRAMINE HCL 25 MG PO CAPS
50.0000 mg | ORAL_CAPSULE | Freq: Once | ORAL | Status: AC
  Administered 2018-01-04: 50 mg via ORAL

## 2018-01-04 NOTE — Progress Notes (Signed)
Patient Care Team: Deland Pretty, MD as PCP - General (Internal Medicine)  DIAGNOSIS:  Encounter Diagnosis  Name Primary?  . 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[place])   04/14/2017 Initial Diagnosis    Left axillary lymph node biopsy: Metastatic carcinoma positive for CK 7, ER positive,GATA-3 equivocal, negative for CK 20,GCDFP, CDX-2, TTF-1; HER-2 positive ratio 2.08; mammogram and ultrasound revealed 3 discrete abnormal left axillary lymph nodes largest measures 1.8 cm      04/23/2017 Breast MRI    Left axillary lymphadenopathy numerous enlarged level I axillary lymph nodes largest 2.1 cm, no other abdominal masses in the left breast itself, no MRI evidence of malignancy in the right breast      04/26/2017 Imaging    CT CAP: Prominent left axillary lymph nodes no distant metastases, sclerotic focus left pedicle of T9 vertebra favored benign, T5 vertebral hemangioma      05/12/2017 - 08/31/2017 Neo-Adjuvant Chemotherapy    TCH Perjeta x6 followed by Herceptin Perjeta maintenance for 1 year      10/14/2017 Surgery    Left axillary lymph node dissection: 0/13 lymph nodes complete pathologic response.  Breast was not operated on because no breast primary was ever identified      12/09/2017 -  Radiation Therapy    Adjuvant radiation therapy       CHIEF COMPLIANT: Herceptin Perjeta maintenance  INTERVAL HISTORY: Melissa [name] is a 57 year old with above-mentioned history of left breast cancer treated with neoadjuvant chemotherapy.  Axillary lymph node dissection did not identified any enlarged lymph nodes suggestive of pathologic complete response.  Breast was not operated because there was no breast primary identified.  She is continuing on Herceptin and Perjeta maintenance and appears to be tolerating it fairly  well.  REVIEW OF SYSTEMS:   Constitutional: Denies fevers, chills or abnormal weight loss Eyes: Denies blurriness of vision Ears, nose, mouth, throat, and face: Denies mucositis or sore throat Respiratory: Denies cough, dyspnea or wheezes Cardiovascular: Denies palpitation, chest discomfort Gastrointestinal:  Denies nausea, heartburn or change in bowel habits Skin: Denies abnormal skin rashes Lymphatics: Denies new lymphadenopathy or easy bruising Neurological:Denies numbness, tingling or new weaknesses Behavioral/Psych: Mood is stable, no new changes  Extremities: No lower extremity edema Breast:  denies any pain or lumps or nodules in either breasts All other systems were reviewed with the patient and are negative.  I have reviewed the past medical history, past surgical history, social history and family history with the patient and they are unchanged from previous note.  ALLERGIES:  has No Known Allergies.  MEDICATIONS:  Current Outpatient Medications  Medication Sig Dispense Refill  . ALPRAZolam (XANAX) 0.5 MG tablet Take 0.5 mg by mouth 3 (three) times daily.  0  . calcium carbonate (OS-CAL) 1250 (500 Ca) MG chewable tablet Chew 1 tablet by mouth daily. Calcium 600 mg    . cholecalciferol (VITAMIN D) 1000 units tablet Take 1,000 Units by mouth daily. 2000 iu    . lidocaine-prilocaine (EMLA) cream Apply to affected area once 30 g 3  . rosuvastatin (CRESTOR) 10 MG tablet   0  . triamterene-hydrochlorothiazide (MAXZIDE-25) 37.5-25 MG tablet Take 1 tablet by mouth daily.    . valACYclovir (VALTREX) 1000 MG tablet take 2 tablets by mouth every 12 hours if needed for 10 days  0  .  zolpidem (AMBIEN) 5 MG tablet Take 1 tablet (5 mg total) at bedtime as needed by mouth for sleep. 30 tablet 3   No current facility-administered medications for this visit.    Facility-Administered Medications Ordered in Other Visits  Medication Dose Route Frequency Provider Last Rate Last Dose  .  heparin lock flush 100 unit/mL  500 Units Intracatheter Once PRN Nicholas Lose, MD      . pertuzumab (PERJETA) 420 mg in sodium chloride 0.9 % 250 mL chemo infusion  420 mg Intravenous Once Nicholas Lose, MD      . sodium chloride flush (NS) 0.9 % injection 10 mL  10 mL Intracatheter PRN Nicholas Lose, MD        PHYSICAL EXAMINATION: ECOG PERFORMANCE STATUS: 1 - Symptomatic but completely ambulatory  Vitals:   01/04/18 1045  BP: (!) 151/91  Pulse: 86  Resp: 18  Temp: 98.3 F (36.8 C)  SpO2: 100%   Filed Weights   01/04/18 1045  Weight: 138 lb 4.8 oz (62.7 kg)    GENERAL:alert, no distress and comfortable SKIN: skin color, texture, turgor are normal, no rashes or significant lesions EYES: normal, Conjunctiva are pink and non-injected, sclera clear OROPHARYNX:no exudate, no erythema and lips, buccal mucosa, and tongue normal  NECK: supple, thyroid normal size, non-tender, without nodularity LYMPH:  no palpable lymphadenopathy in the cervical, axillary or inguinal LUNGS: clear to auscultation and percussion with normal breathing effort HEART: regular rate & rhythm and no murmurs and no lower extremity edema ABDOMEN:abdomen soft, non-tender and normal bowel sounds MUSCULOSKELETAL:no cyanosis of digits and no clubbing  NEURO: alert & oriented x 3 with fluent speech, no focal motor/sensory deficits EXTREMITIES: No lower extremity edema BREAST: No palpable masses or nodules in either right or left breasts. No palpable axillary supraclavicular or infraclavicular adenopathy no breast tenderness or nipple discharge. (exam performed in the presence of a chaperone)  LABORATORY DATA:  I have reviewed the data as listed CMP Latest Ref Rng & Units 01/04/2018 11/23/2017 11/02/2017  Glucose 70 - 140 mg/dL 97 94 128  BUN 7 - 26 mg/dL 17 16.3 12.7  Creatinine 0.6 - 1.1 mg/dL - 0.8 0.9  Sodium 136 - 145 mmol/L 140 140 137  Potassium 3.3 - 4.7 mmol/L 3.8 3.5 3.5  Chloride 98 - 109 mmol/L 100 -  -  CO2 22 - 29 mmol/L _0 Calcium 8.4 - 10.4 mg/dL 10.2 9.8 9.6  Total Protein 6.4 - 8.3 g/dL 7.6 6.9 6.9  Total Bilirubin 0.2 - 1.2 mg/dL 0.3 0.52 0.41  Alkaline Phos 40 - 150 U/L 79 69 66  AST 5 - 34 U/L _1 ALT 0 - 55 U/L _2 Lab Results  Component Value Date   WBC 4.6 01/04/2018   HGB 11.3 (L) 11/23/2017   HCT 38.0 01/04/2018   MCV 89.7 01/04/2018   PLT 264 01/04/2018   NEUTROABS 3.4 01/04/2018    ASSESSMENT & PLAN:  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 Neoadjuvant TCH Perjeta 6 cycles completed 08/31/2017 followed by Herceptin, Perjeta maintenance for 1 year 2. followed by axillary lymph node dissection. Breast surgery will not  be performed because there is no primary tumor identifiable on breast MRI: 10/14/17: 0/13 LN Negative 3. Followed by radiation 4. Followed by antiestrogen therapy ------------------------------------------------------------------------------------------------------------------ Current Treatment:Herceptin Perjeta maintenance therapy for 1 year Toxicities:  Continue with maintenance therapy with Herceptin and Perjeta every 3 weeks F/U with me in 6 weeks     I spent 25 minutes talking to the patient of which more than half was spent in counseling and coordination of care.  No orders of the defined types were placed in this encounter.  The patient has a good understanding of the overall plan. she agrees with it. she will call with any problems that may develop before the next visit here.   Harriette Ohara, MD 01/04/18

## 2018-01-04 NOTE — Progress Notes (Signed)
Patient only wanted to stay for 15 minutes post observation after Perjeta. Vital signs obtained.

## 2018-01-04 NOTE — Patient Instructions (Signed)
[facility] Discharge Instructions for Patients Receiving Chemotherapy  Today you received the following chemotherapy agents Herceptin and Perjeta.   To help prevent nausea and vomiting after your treatment, we encourage you to take your nausea medication as directed.   If you develop nausea and vomiting that is not controlled by your nausea medication, call the clinic.   BELOW ARE SYMPTOMS THAT SHOULD BE REPORTED IMMEDIATELY:  *FEVER GREATER THAN 100.5 F  *CHILLS WITH OR WITHOUT FEVER  NAUSEA AND VOMITING THAT IS NOT CONTROLLED WITH YOUR NAUSEA MEDICATION  *UNUSUAL SHORTNESS OF BREATH  *UNUSUAL BRUISING OR BLEEDING  TENDERNESS IN MOUTH AND THROAT WITH OR WITHOUT PRESENCE OF ULCERS  *URINARY PROBLEMS  *BOWEL PROBLEMS  UNUSUAL RASH Items with * indicate a potential emergency and should be followed up as soon as possible.  Feel free to call the clinic should you have any questions or concerns. The clinic phone number is [phone].  Please show the CHEMO ALERT CARD at check-in to the Emergency Department and triage nurse.   

## 2018-01-04 NOTE — Telephone Encounter (Signed)
Gave patient AVS and calendar of upcoming February through June appointments.

## 2018-01-05 ENCOUNTER — Ambulatory Visit
Admission: RE | Admit: 2018-01-05 | Discharge: 2018-01-05 | Disposition: A | Discharge status: Home / Self Care | Payer: BLUE CROSS/BLUE SHIELD | Source: Ambulatory Visit | Attending: Radiation Oncology | Admitting: Radiation Oncology

## 2018-01-05 DIAGNOSIS — C50612 Malignant neoplasm of axillary tail of left female breast: Secondary | ICD-10-CM | POA: Diagnosis not present

## 2018-01-05 DIAGNOSIS — C773 Secondary and unspecified malignant neoplasm of axilla and upper limb lymph nodes: Secondary | ICD-10-CM | POA: Diagnosis not present

## 2018-01-05 DIAGNOSIS — Z79899 Other long term (current) drug therapy: Secondary | ICD-10-CM | POA: Diagnosis not present

## 2018-01-05 DIAGNOSIS — Z170 Estrogen receptor positive status [ER+]: Secondary | ICD-10-CM | POA: Diagnosis not present

## 2018-01-05 DIAGNOSIS — Z510 Encounter for antineoplastic radiation therapy: Secondary | ICD-10-CM | POA: Diagnosis not present

## 2018-01-05 DIAGNOSIS — Z79891 Long term (current) use of opiate analgesic: Secondary | ICD-10-CM | POA: Diagnosis not present

## 2018-01-06 ENCOUNTER — Ambulatory Visit
Admission: RE | Admit: 2018-01-06 | Discharge: 2018-01-06 | Disposition: A | Discharge status: Home / Self Care | Payer: BLUE CROSS/BLUE SHIELD | Source: Ambulatory Visit | Attending: Radiation Oncology | Admitting: Radiation Oncology

## 2018-01-06 DIAGNOSIS — Z79899 Other long term (current) drug therapy: Secondary | ICD-10-CM | POA: Diagnosis not present

## 2018-01-06 DIAGNOSIS — Z79891 Long term (current) use of opiate analgesic: Secondary | ICD-10-CM | POA: Diagnosis not present

## 2018-01-06 DIAGNOSIS — C773 Secondary and unspecified malignant neoplasm of axilla and upper limb lymph nodes: Secondary | ICD-10-CM | POA: Diagnosis not present

## 2018-01-06 DIAGNOSIS — C50612 Malignant neoplasm of axillary tail of left female breast: Secondary | ICD-10-CM | POA: Diagnosis not present

## 2018-01-06 DIAGNOSIS — Z510 Encounter for antineoplastic radiation therapy: Secondary | ICD-10-CM | POA: Diagnosis not present

## 2018-01-06 DIAGNOSIS — Z170 Estrogen receptor positive status [ER+]: Secondary | ICD-10-CM | POA: Diagnosis not present

## 2018-01-07 ENCOUNTER — Ambulatory Visit
Admission: RE | Admit: 2018-01-07 | Discharge: 2018-01-07 | Disposition: A | Discharge status: Home / Self Care | Payer: BLUE CROSS/BLUE SHIELD | Source: Ambulatory Visit | Attending: Radiation Oncology | Admitting: Radiation Oncology

## 2018-01-07 DIAGNOSIS — Z510 Encounter for antineoplastic radiation therapy: Secondary | ICD-10-CM | POA: Diagnosis not present

## 2018-01-07 DIAGNOSIS — C773 Secondary and unspecified malignant neoplasm of axilla and upper limb lymph nodes: Secondary | ICD-10-CM | POA: Diagnosis not present

## 2018-01-07 DIAGNOSIS — C50612 Malignant neoplasm of axillary tail of left female breast: Secondary | ICD-10-CM | POA: Diagnosis not present

## 2018-01-07 DIAGNOSIS — Z170 Estrogen receptor positive status [ER+]: Secondary | ICD-10-CM | POA: Diagnosis not present

## 2018-01-07 DIAGNOSIS — Z79891 Long term (current) use of opiate analgesic: Secondary | ICD-10-CM | POA: Diagnosis not present

## 2018-01-07 DIAGNOSIS — Z79899 Other long term (current) drug therapy: Secondary | ICD-10-CM | POA: Diagnosis not present

## 2018-01-10 ENCOUNTER — Ambulatory Visit
Admission: RE | Admit: 2018-01-10 | Discharge: 2018-01-10 | Disposition: A | Discharge status: Home / Self Care | Payer: BLUE CROSS/BLUE SHIELD | Source: Ambulatory Visit | Attending: Radiation Oncology | Admitting: Radiation Oncology

## 2018-01-10 DIAGNOSIS — C773 Secondary and unspecified malignant neoplasm of axilla and upper limb lymph nodes: Secondary | ICD-10-CM | POA: Diagnosis not present

## 2018-01-10 DIAGNOSIS — Z79891 Long term (current) use of opiate analgesic: Secondary | ICD-10-CM | POA: Diagnosis not present

## 2018-01-10 DIAGNOSIS — C50612 Malignant neoplasm of axillary tail of left female breast: Secondary | ICD-10-CM

## 2018-01-10 DIAGNOSIS — Z170 Estrogen receptor positive status [ER+]: Principal | ICD-10-CM

## 2018-01-10 DIAGNOSIS — Z79899 Other long term (current) drug therapy: Secondary | ICD-10-CM | POA: Diagnosis not present

## 2018-01-10 DIAGNOSIS — Z510 Encounter for antineoplastic radiation therapy: Secondary | ICD-10-CM | POA: Diagnosis not present

## 2018-01-10 MED ORDER — RADIAPLEXRX EX GEL
Freq: Once | CUTANEOUS | Status: AC
  Administered 2018-01-10: 11:00:00 via TOPICAL

## 2018-01-11 ENCOUNTER — Ambulatory Visit: Payer: BLUE CROSS/BLUE SHIELD

## 2018-01-11 ENCOUNTER — Ambulatory Visit
Admission: RE | Admit: 2018-01-11 | Discharge: 2018-01-11 | Disposition: A | Discharge status: Home / Self Care | Payer: BLUE CROSS/BLUE SHIELD | Source: Ambulatory Visit | Attending: Radiation Oncology | Admitting: Radiation Oncology

## 2018-01-11 DIAGNOSIS — C50612 Malignant neoplasm of axillary tail of left female breast: Secondary | ICD-10-CM | POA: Diagnosis not present

## 2018-01-11 DIAGNOSIS — C773 Secondary and unspecified malignant neoplasm of axilla and upper limb lymph nodes: Secondary | ICD-10-CM | POA: Diagnosis not present

## 2018-01-11 DIAGNOSIS — Z510 Encounter for antineoplastic radiation therapy: Secondary | ICD-10-CM | POA: Diagnosis not present

## 2018-01-11 DIAGNOSIS — Z170 Estrogen receptor positive status [ER+]: Secondary | ICD-10-CM | POA: Diagnosis not present

## 2018-01-11 DIAGNOSIS — Z79891 Long term (current) use of opiate analgesic: Secondary | ICD-10-CM | POA: Diagnosis not present

## 2018-01-11 DIAGNOSIS — Z79899 Other long term (current) drug therapy: Secondary | ICD-10-CM | POA: Diagnosis not present

## 2018-01-12 ENCOUNTER — Ambulatory Visit
Admission: RE | Admit: 2018-01-12 | Discharge: 2018-01-12 | Disposition: A | Discharge status: Home / Self Care | Payer: BLUE CROSS/BLUE SHIELD | Source: Ambulatory Visit | Attending: Radiation Oncology | Admitting: Radiation Oncology

## 2018-01-12 DIAGNOSIS — Z510 Encounter for antineoplastic radiation therapy: Secondary | ICD-10-CM | POA: Diagnosis not present

## 2018-01-12 DIAGNOSIS — Z79891 Long term (current) use of opiate analgesic: Secondary | ICD-10-CM | POA: Diagnosis not present

## 2018-01-12 DIAGNOSIS — C773 Secondary and unspecified malignant neoplasm of axilla and upper limb lymph nodes: Secondary | ICD-10-CM | POA: Diagnosis not present

## 2018-01-12 DIAGNOSIS — C50612 Malignant neoplasm of axillary tail of left female breast: Secondary | ICD-10-CM | POA: Diagnosis not present

## 2018-01-12 DIAGNOSIS — Z79899 Other long term (current) drug therapy: Secondary | ICD-10-CM | POA: Diagnosis not present

## 2018-01-12 DIAGNOSIS — Z170 Estrogen receptor positive status [ER+]: Secondary | ICD-10-CM | POA: Diagnosis not present

## 2018-01-25 ENCOUNTER — Inpatient Hospital Stay: Payer: BLUE CROSS/BLUE SHIELD

## 2018-01-25 ENCOUNTER — Encounter: Payer: Self-pay | Admitting: Radiation Oncology

## 2018-01-25 ENCOUNTER — Inpatient Hospital Stay: Payer: BLUE CROSS/BLUE SHIELD | Attending: Hematology and Oncology

## 2018-01-25 ENCOUNTER — Other Ambulatory Visit: Payer: BLUE CROSS/BLUE SHIELD

## 2018-01-25 VITALS — BP 117/78 | HR 70 | Temp 97.7°F | Resp 17 | Wt 139.0 lb

## 2018-01-25 DIAGNOSIS — C773 Secondary and unspecified malignant neoplasm of axilla and upper limb lymph nodes: Secondary | ICD-10-CM | POA: Diagnosis not present

## 2018-01-25 DIAGNOSIS — Z170 Estrogen receptor positive status [ER+]: Secondary | ICD-10-CM

## 2018-01-25 DIAGNOSIS — C801 Malignant (primary) neoplasm, unspecified: Secondary | ICD-10-CM | POA: Insufficient documentation

## 2018-01-25 DIAGNOSIS — R55 Syncope and collapse: Secondary | ICD-10-CM | POA: Insufficient documentation

## 2018-01-25 DIAGNOSIS — Z5112 Encounter for antineoplastic immunotherapy: Secondary | ICD-10-CM | POA: Insufficient documentation

## 2018-01-25 DIAGNOSIS — Z95828 Presence of other vascular implants and grafts: Secondary | ICD-10-CM

## 2018-01-25 DIAGNOSIS — R197 Diarrhea, unspecified: Secondary | ICD-10-CM | POA: Diagnosis not present

## 2018-01-25 DIAGNOSIS — R110 Nausea: Secondary | ICD-10-CM | POA: Insufficient documentation

## 2018-01-25 DIAGNOSIS — C50612 Malignant neoplasm of axillary tail of left female breast: Secondary | ICD-10-CM

## 2018-01-25 DIAGNOSIS — E7800 Pure hypercholesterolemia, unspecified: Secondary | ICD-10-CM | POA: Insufficient documentation

## 2018-01-25 MED ORDER — SODIUM CHLORIDE 0.9 % IV SOLN
378.0000 mg | Freq: Once | INTRAVENOUS | Status: AC
  Administered 2018-01-25: 378 mg via INTRAVENOUS
  Filled 2018-01-25: qty 18

## 2018-01-25 MED ORDER — SODIUM CHLORIDE 0.9% FLUSH
10.0000 mL | INTRAVENOUS | Status: DC | PRN
  Administered 2018-01-25: 10 mL via INTRAVENOUS
  Filled 2018-01-25: qty 10

## 2018-01-25 MED ORDER — HEPARIN SOD (PORK) LOCK FLUSH 100 UNIT/ML IV SOLN
500.0000 [IU] | Freq: Once | INTRAVENOUS | Status: AC | PRN
  Administered 2018-01-25: 500 [IU]
  Filled 2018-01-25: qty 5

## 2018-01-25 MED ORDER — SODIUM CHLORIDE 0.9% FLUSH
10.0000 mL | INTRAVENOUS | Status: DC | PRN
  Administered 2018-01-25: 10 mL
  Filled 2018-01-25: qty 10

## 2018-01-25 MED ORDER — ACETAMINOPHEN 325 MG PO TABS
650.0000 mg | ORAL_TABLET | Freq: Once | ORAL | Status: AC
  Administered 2018-01-25: 650 mg via ORAL

## 2018-01-25 MED ORDER — DIPHENHYDRAMINE HCL 25 MG PO CAPS
50.0000 mg | ORAL_CAPSULE | Freq: Once | ORAL | Status: AC
  Administered 2018-01-25: 50 mg via ORAL

## 2018-01-25 MED ORDER — DIPHENHYDRAMINE HCL 25 MG PO CAPS
ORAL_CAPSULE | ORAL | Status: AC
  Filled 2018-01-25: qty 2

## 2018-01-25 MED ORDER — SODIUM CHLORIDE 0.9 % IV SOLN
420.0000 mg | Freq: Once | INTRAVENOUS | Status: AC
  Administered 2018-01-25: 420 mg via INTRAVENOUS
  Filled 2018-01-25: qty 14

## 2018-01-25 MED ORDER — ACETAMINOPHEN 325 MG PO TABS
ORAL_TABLET | ORAL | Status: AC
  Filled 2018-01-25: qty 2

## 2018-01-25 MED ORDER — SODIUM CHLORIDE 0.9 % IV SOLN
Freq: Once | INTRAVENOUS | Status: AC
  Administered 2018-01-25: 11:00:00 via INTRAVENOUS

## 2018-01-25 NOTE — Progress Notes (Signed)
  Radiation Oncology         [phone] ________________________________  Name: Melissa [name] MRN: [id number]  Date: 01/25/2018  DOB: 03-08-1961  End of Treatment Note  Diagnosis:   Stage Tx N1 Mx Left Axillary Metastatic Carcinoma, presumed breast primary , ER 40% Positive / PR Negative / Her2 Positive by FISH     Indication for treatment:  Curative       Radiation treatment dates:   12/09/17 - 01/12/18  Site/dose:   Left breast treated to 50 Gy with 25 fx of 2 Gy   Left SCV / posterior axilla treated to 50 Gy with 25 fx of 2 Gy  Beams/energy:   3D / 6X, 10X  Narrative: The patient tolerated radiation treatment relatively well.   She denied fatigue or pain but did endorse dermatitis treated with hydrocortisone cream and Radiaplex.  Plan: The patient has completed radiation treatment. The patient will return to radiation oncology clinic for routine followup in one month. I advised them to call or return sooner if they have any questions or concerns related to their recovery or treatment.  -----------------------------------  Eppie Gibson, MD   This document serves as a record of services personally performed by Eppie Gibson, MD. It was created on his behalf by Linward Natal, a trained medical scribe. The creation of this record is based on the scribe's personal observations and the provider's statements to them. This document has been checked and approved by the attending provider.

## 2018-01-25 NOTE — Progress Notes (Signed)
Patient declined to wait for 30 min observation period. Alert and verbally responsive on exit. Denies concerns or complaints.

## 2018-01-25 NOTE — Patient Instructions (Signed)
[facility] Discharge Instructions for Patients Receiving Chemotherapy  Today you received the following chemotherapy agents Herceptin; Perjeta  To help prevent nausea and vomiting after your treatment, we encourage you to take your nausea medication as directed   If you develop nausea and vomiting that is not controlled by your nausea medication, call the clinic.   BELOW ARE SYMPTOMS THAT SHOULD BE REPORTED IMMEDIATELY:  *FEVER GREATER THAN 100.5 F  *CHILLS WITH OR WITHOUT FEVER  NAUSEA AND VOMITING THAT IS NOT CONTROLLED WITH YOUR NAUSEA MEDICATION  *UNUSUAL SHORTNESS OF BREATH  *UNUSUAL BRUISING OR BLEEDING  TENDERNESS IN MOUTH AND THROAT WITH OR WITHOUT PRESENCE OF ULCERS  *URINARY PROBLEMS  *BOWEL PROBLEMS  UNUSUAL RASH Items with * indicate a potential emergency and should be followed up as soon as possible.  Feel free to call the clinic should you have any questions or concerns. The clinic phone number is [phone].  Please show the CHEMO ALERT CARD at check-in to the Emergency Department and triage nurse.   

## 2018-01-30 ENCOUNTER — Other Ambulatory Visit: Payer: Self-pay

## 2018-01-30 ENCOUNTER — Encounter (HOSPITAL_COMMUNITY): Payer: Self-pay | Admitting: Nurse Practitioner

## 2018-01-30 ENCOUNTER — Emergency Department (HOSPITAL_COMMUNITY)
Admission: EM | Admit: 2018-01-30 | Discharge: 2018-01-30 | Disposition: A | Discharge status: Home / Self Care | Payer: BLUE CROSS/BLUE SHIELD | Attending: Emergency Medicine | Admitting: Emergency Medicine

## 2018-01-30 DIAGNOSIS — R55 Syncope and collapse: Secondary | ICD-10-CM | POA: Diagnosis not present

## 2018-01-30 DIAGNOSIS — Z95828 Presence of other vascular implants and grafts: Secondary | ICD-10-CM | POA: Diagnosis not present

## 2018-01-30 DIAGNOSIS — C50612 Malignant neoplasm of axillary tail of left female breast: Secondary | ICD-10-CM | POA: Insufficient documentation

## 2018-01-30 DIAGNOSIS — I10 Essential (primary) hypertension: Secondary | ICD-10-CM | POA: Diagnosis not present

## 2018-01-30 DIAGNOSIS — R109 Unspecified abdominal pain: Secondary | ICD-10-CM | POA: Diagnosis not present

## 2018-01-30 DIAGNOSIS — R1084 Generalized abdominal pain: Secondary | ICD-10-CM | POA: Insufficient documentation

## 2018-01-30 DIAGNOSIS — Z8541 Personal history of malignant neoplasm of cervix uteri: Secondary | ICD-10-CM | POA: Insufficient documentation

## 2018-01-30 DIAGNOSIS — R110 Nausea: Secondary | ICD-10-CM | POA: Diagnosis not present

## 2018-01-30 DIAGNOSIS — R197 Diarrhea, unspecified: Secondary | ICD-10-CM | POA: Diagnosis not present

## 2018-01-30 DIAGNOSIS — Z79899 Other long term (current) drug therapy: Secondary | ICD-10-CM | POA: Diagnosis not present

## 2018-01-30 LAB — CBC WITH DIFFERENTIAL/PLATELET
Basophils Absolute: 0 10*3/uL (ref 0.0–0.1)
Basophils Relative: 0 %
Eosinophils Absolute: 0 10*3/uL (ref 0.0–0.7)
Eosinophils Relative: 0 %
HCT: 41 % (ref 36.0–46.0)
Hemoglobin: 14.1 g/dL (ref 12.0–15.0)
Lymphocytes Relative: 8 %
Lymphs Abs: 0.6 10*3/uL — ABNORMAL LOW (ref 0.7–4.0)
MCH: 30.6 pg (ref 26.0–34.0)
MCHC: 34.4 g/dL (ref 30.0–36.0)
MCV: 88.9 fL (ref 78.0–100.0)
Monocytes Absolute: 0.4 10*3/uL (ref 0.1–1.0)
Monocytes Relative: 5 %
Neutro Abs: 7 10*3/uL (ref 1.7–7.7)
Neutrophils Relative %: 87 %
Platelets: 275 10*3/uL (ref 150–400)
RBC: 4.61 MIL/uL (ref 3.87–5.11)
RDW: 12.8 % (ref 11.5–15.5)
WBC: 8.1 10*3/uL (ref 4.0–10.5)

## 2018-01-30 LAB — BASIC METABOLIC PANEL
Anion gap: 15 (ref 5–15)
BUN: 23 mg/dL — ABNORMAL HIGH (ref 6–20)
CO2: 24 mmol/L (ref 22–32)
Calcium: 10.7 mg/dL — ABNORMAL HIGH (ref 8.9–10.3)
Chloride: 95 mmol/L — ABNORMAL LOW (ref 101–111)
Creatinine, Ser: 1.03 mg/dL — ABNORMAL HIGH (ref 0.44–1.00)
GFR calc Af Amer: >60 mL/min (ref >60)
GFR calc non Af Amer: 59 mL/min — ABNORMAL LOW (ref >60)
Glucose, Bld: 131 mg/dL — ABNORMAL HIGH (ref 65–99)
Potassium: 3.2 mmol/L — ABNORMAL LOW (ref 3.5–5.1)
Sodium: 134 mmol/L — ABNORMAL LOW (ref 135–145)

## 2018-01-30 LAB — I-STAT TROPONIN, ED: Troponin i, poc: 0 ng/mL (ref 0.00–0.08)

## 2018-01-30 MED ORDER — SODIUM CHLORIDE 0.9 % IV BOLUS (SEPSIS)
1000.0000 mL | Freq: Once | INTRAVENOUS | Status: AC
  Administered 2018-01-30: 1000 mL via INTRAVENOUS

## 2018-01-30 MED ORDER — POTASSIUM CHLORIDE CRYS ER 20 MEQ PO TBCR
40.0000 meq | EXTENDED_RELEASE_TABLET | Freq: Once | ORAL | Status: AC
  Administered 2018-01-30: 40 meq via ORAL
  Filled 2018-01-30: qty 2

## 2018-01-30 NOTE — Discharge Instructions (Signed)
Drink plenty of water and get plenty of rest.  You may take your home potassium supplements 1 tablet daily for the next few days.  Follow-up with your primary care physician for reevaluation of symptoms and possible repeat lab work.  Return to the emergency department if any concerning signs or symptoms develop such as persistent shortness of breath, chest pain, altered mental status, slurred speech, or passing out.
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
## 2018-01-30 NOTE — ED Notes (Signed)
ED PA at bedside ambulating pt.
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
## 2018-01-31 ENCOUNTER — Telehealth: Payer: Self-pay | Admitting: *Deleted

## 2018-01-31 ENCOUNTER — Encounter: Payer: Self-pay | Admitting: Adult Health

## 2018-01-31 ENCOUNTER — Other Ambulatory Visit: Payer: Self-pay | Admitting: Adult Health

## 2018-01-31 ENCOUNTER — Inpatient Hospital Stay (HOSPITAL_BASED_OUTPATIENT_CLINIC_OR_DEPARTMENT_OTHER): Payer: BLUE CROSS/BLUE SHIELD | Admitting: Adult Health

## 2018-01-31 ENCOUNTER — Inpatient Hospital Stay: Payer: BLUE CROSS/BLUE SHIELD

## 2018-01-31 VITALS — BP 134/78 | HR 80 | Temp 98.1°F | Resp 18 | Ht 68.0 in | Wt 138.9 lb

## 2018-01-31 DIAGNOSIS — C773 Secondary and unspecified malignant neoplasm of axilla and upper limb lymph nodes: Secondary | ICD-10-CM | POA: Diagnosis not present

## 2018-01-31 DIAGNOSIS — C801 Malignant (primary) neoplasm, unspecified: Secondary | ICD-10-CM

## 2018-01-31 DIAGNOSIS — Z170 Estrogen receptor positive status [ER+]: Secondary | ICD-10-CM

## 2018-01-31 DIAGNOSIS — Z5112 Encounter for antineoplastic immunotherapy: Secondary | ICD-10-CM | POA: Diagnosis not present

## 2018-01-31 DIAGNOSIS — R55 Syncope and collapse: Secondary | ICD-10-CM | POA: Diagnosis not present

## 2018-01-31 DIAGNOSIS — Z95828 Presence of other vascular implants and grafts: Secondary | ICD-10-CM

## 2018-01-31 DIAGNOSIS — R197 Diarrhea, unspecified: Secondary | ICD-10-CM | POA: Diagnosis not present

## 2018-01-31 DIAGNOSIS — C50612 Malignant neoplasm of axillary tail of left female breast: Secondary | ICD-10-CM

## 2018-01-31 DIAGNOSIS — E7800 Pure hypercholesterolemia, unspecified: Secondary | ICD-10-CM | POA: Diagnosis not present

## 2018-01-31 DIAGNOSIS — R112 Nausea with vomiting, unspecified: Secondary | ICD-10-CM | POA: Diagnosis not present

## 2018-01-31 DIAGNOSIS — R110 Nausea: Secondary | ICD-10-CM | POA: Diagnosis not present

## 2018-01-31 LAB — CBC WITH DIFFERENTIAL (CANCER CENTER ONLY)
BASOS PCT: 0 %
Basophils Absolute: 0 10*3/uL (ref 0.0–0.1)
EOS ABS: 0 10*3/uL (ref 0.0–0.5)
Eosinophils Relative: 1 %
HEMATOCRIT: 34.1 % — AB (ref 34.8–46.6)
Hemoglobin: 11.7 g/dL (ref 11.6–15.9)
Lymphocytes Relative: 14 %
Lymphs Abs: 0.9 10*3/uL (ref 0.9–3.3)
MCH: 30.2 pg (ref 25.1–34.0)
MCHC: 34.2 g/dL (ref 31.5–36.0)
MCV: 88.2 fL (ref 79.5–101.0)
Monocytes Absolute: 0.5 10*3/uL (ref 0.1–0.9)
Monocytes Relative: 8 %
NEUTROS ABS: 5.2 10*3/uL (ref 1.5–6.5)
Neutrophils Relative %: 77 %
Platelet Count: 219 10*3/uL (ref 145–400)
RBC: 3.87 MIL/uL (ref 3.70–5.45)
RDW: 13.2 % (ref 11.2–14.5)
WBC: 6.7 10*3/uL (ref 3.9–10.3)

## 2018-01-31 LAB — CMP (CANCER CENTER ONLY)
ALBUMIN: 3.8 g/dL (ref 3.5–5.0)
ALK PHOS: 83 U/L (ref 40–150)
ALT: 20 U/L (ref 0–55)
AST: 18 U/L (ref 5–34)
Anion gap: 10 (ref 3–11)
BUN: 13 mg/dL (ref 7–26)
CALCIUM: 9.5 mg/dL (ref 8.4–10.4)
CO2: 26 mmol/L (ref 22–29)
CREATININE: 0.83 mg/dL (ref 0.60–1.10)
Chloride: 99 mmol/L (ref 98–109)
GFR, Est AFR Am: >60 mL/min (ref >60)
GFR, Estimated: >60 mL/min (ref >60)
GLUCOSE: 139 mg/dL (ref 70–140)
Potassium: 3.3 mmol/L — ABNORMAL LOW (ref 3.5–5.1)
SODIUM: 135 mmol/L — AB (ref 136–145)
Total Bilirubin: 0.7 mg/dL (ref 0.2–1.2)
Total Protein: 6.7 g/dL (ref 6.4–8.3)

## 2018-01-31 MED ORDER — SODIUM CHLORIDE 0.9 % IV SOLN: Freq: Once | INTRAVENOUS | Status: DC

## 2018-01-31 MED ORDER — SODIUM CHLORIDE 0.9% FLUSH
10.0000 mL | INTRAVENOUS | Status: DC | PRN
  Administered 2018-01-31: 10 mL via INTRAVENOUS
  Filled 2018-01-31: qty 10

## 2018-01-31 MED ORDER — ONDANSETRON HCL 4 MG/2ML IJ SOLN
8.0000 mg | Freq: Once | INTRAMUSCULAR | Status: AC
  Administered 2018-01-31: 8 mg via INTRAVENOUS

## 2018-01-31 MED ORDER — SODIUM CHLORIDE 0.9 % IV SOLN
INTRAVENOUS | Status: DC
  Administered 2018-01-31: 15:00:00 via INTRAVENOUS

## 2018-01-31 MED ORDER — ONDANSETRON HCL 4 MG/2ML IJ SOLN
INTRAMUSCULAR | Status: AC
  Filled 2018-01-31: qty 2

## 2018-01-31 NOTE — Patient Instructions (Addendum)
Central Line, Adult A central line is a thin, flexible tube (catheter) that is put in your vein. It can be used to:  Take blood for lab tests.  Give you medicine.  Give you food and nutrients.  The procedure may vary among doctors and hospitals. Follow these instructions at home: Caring for the tube  Follow instructions from your doctor about: ? Flushing the tube with saline solution. ? Cleaning the tube and the area around it.  Only flush with clean (sterile) supplies. The supplies should be from your doctor, a pharmacy, or another place that your doctor recommends.  Before you flush the tube or clean the area around the tube: ? Wash your hands with soap and water. If you cannot use soap and water, use hand sanitizer. ? Clean the central line hub with rubbing alcohol. Caring for your skin  Keep the area where the tube was put in clean and dry.  Every day, and when changing the bandage, check the skin around the central line for: ? Redness, swelling, or pain. ? Fluid or blood. ? Warmth. ? Pus. ? A bad smell. General instructions  Keep the tube clamped, unless it is being used.  Keep your supplies in a clean, dry location.  If you or someone else accidentally pulls on the tube, make sure: ? The bandage (dressing) is okay. ? There is no bleeding. ? The tube has not been pulled out.  Do not use scissors or sharp objects near the tube.  Do not swim or let the tube soak in a tub.  Ask your doctor what activities are safe for you. Your doctor may tell you not to lift anything or move your arm too much.  Take over-the-counter and prescription medicines only as told by your doctor.  Change bandages as told by your doctor.  Keep your bandage dry. If a bandage gets wet, have it changed right away.  Keep all follow-up visits as told by your doctor. This is important. Throwing away supplies  Throw away any syringes in a trash (disposal) container that is only for sharp  items (sharps container). You can buy a sharps container from a pharmacy, or you can make one by using an empty hard plastic bottle with a cover.  Place any used bandages or infusion bags into a plastic bag. Throw that bag in the trash. Contact a doctor if:  You have any of these where the tube was put in: ? Redness, swelling, or pain. ? Fluid or blood. ? A warm feeling. ? Pus or a bad smell. Get help right away if:  You have: ? A fever. ? Chills. ? Trouble getting enough air (shortness of breath). ? Trouble breathing. ? Pain in your chest. ? Swelling in your neck, face, chest, or arm.  You are coughing.  You feel your heart beating fast or skipping beats.  You feel dizzy or you pass out (faint).  There are red lines coming from where the tube was put in.  The area where the tube was put in is bleeding and the bleeding will not stop.  Your tube is hard to flush.  You do not get a blood return from the tube.  The tube gets loose or comes out.  The tube has a hole or a tear.  The tube leaks. Summary  A central line is a thin, flexible tube (catheter) that is put in your vein. It can be used to take blood for lab tests or to   give you medicine.  Follow instructions from your doctor about flushing and cleaning the tube.  Keep the area where the tube was put in clean and dry.  Ask your doctor what activities are safe for you. This information is not intended to replace advice given to you by your health care provider. Make sure you discuss any questions you have with your health care provider. Document Released: 11/09/2012 Document Revised: 12/10/2016 Document Reviewed: 12/10/2016 Elsevier Interactive Patient Education  2017 [place]. Potassium Content of Foods Potassium is a mineral found in many foods and drinks. It helps keep fluids and minerals balanced in your body and affects how steadily your heart beats. Potassium also helps control your blood pressure and  keep your muscles and nervous system healthy. Certain health conditions and medicines may change the balance of potassium in your body. When this happens, you can help balance your level of potassium through the foods that you do or do not eat. Your health care provider or dietitian may recommend an amount of potassium that you should have each day. The following lists of foods provide the amount of potassium (in parentheses) per serving in each item. High in potassium The following foods and beverages have 200 mg or more of potassium per serving:  Apricots, 2 raw or 5 dry (200 mg).  Artichoke, 1 medium (345 mg).  Avocado, raw,  each (245 mg).  Banana, 1 medium (425 mg).  Beans, lima, or baked beans, canned,  cup (280 mg).  Beans, white, canned,  cup (595 mg).  Beef roast, 3 oz (320 mg).  Beef, ground, 3 oz (270 mg).  Beets, raw or cooked,  cup (260 mg).  Bran muffin, 2 oz (300 mg).  Broccoli,  cup (230 mg).  Brussels sprouts,  cup (250 mg).  Cantaloupe,  cup (215 mg).  Cereal, 100% bran,  cup (200-400 mg).  Cheeseburger, single, fast food, 1 each (225-400 mg).  Chicken, 3 oz (220 mg).  Clams, canned, 3 oz (535 mg).  Crab, 3 oz (225 mg).  Dates, 5 each (270 mg).  Dried beans and peas,  cup (300-475 mg).  Figs, dried, 2 each (260 mg).  Fish: halibut, tuna, cod, snapper, 3 oz (480 mg).  Fish: salmon, haddock, swordfish, perch, 3 oz (300 mg).  Fish, tuna, canned 3 oz (200 mg).  [place] fries, fast food, 3 oz (470 mg).  Granola with fruit and nuts,  cup (200 mg).  Grapefruit juice,  cup (200 mg).  Greens, beet,  cup (655 mg).  Honeydew melon,  cup (200 mg).  Kale, raw, 1 cup (300 mg).  Kiwi, 1 medium (240 mg).  Kohlrabi, rutabaga, parsnips,  cup (280 mg).  Lentils,  cup (365 mg).  Mango, 1 each (325 mg).  Milk, chocolate, 1 cup (420 mg).  Milk: nonfat, low-fat, whole, buttermilk, 1 cup (350-380 mg).  Molasses, 1 Tbsp (295  mg).  Mushrooms,  cup (280) mg.  Nectarine, 1 each (275 mg).  Nuts: almonds, peanuts, hazelnuts, [place], cashew, mixed, 1 oz (200 mg).  Nuts, pistachios, 1 oz (295 mg).  Orange, 1 each (240 mg).  Orange juice,  cup (235 mg).  Papaya, medium,  fruit (390 mg).  Peanut butter, chunky, 2 Tbsp (240 mg).  Peanut butter, smooth, 2 Tbsp (210 mg).  Pear, 1 medium (200 mg).  Pomegranate, 1 whole (400 mg).  Pomegranate juice,  cup (215 mg).  Pork, 3 oz (350 mg).  Potato chips, salted, 1 oz (465 mg).  Potato, baked  with skin, 1 medium (925 mg).  Potatoes, boiled,  cup (255 mg).  Potatoes, mashed,  cup (330 mg).  Prune juice,  cup (370 mg).  Prunes, 5 each (305 mg).  Pudding, chocolate,  cup (230 mg).  Pumpkin, canned,  cup (250 mg).  Raisins, seedless,  cup (270 mg).  Seeds, sunflower or pumpkin, 1 oz (240 mg).  Soy milk, 1 cup (300 mg).  Spinach,  cup (420 mg).  Spinach, canned,  cup (370 mg).  Sweet potato, baked with skin, 1 medium (450 mg).  Swiss chard,  cup (480 mg).  Tomato or vegetable juice,  cup (275 mg).  Tomato sauce or puree,  cup (400-550 mg).  Tomato, raw, 1 medium (290 mg).  Tomatoes, canned,  cup (200-300 mg).  [place], 3 oz (250 mg).  Wheat germ, 1 oz (250 mg).  Winter squash,  cup (250 mg).  Yogurt, plain or fruited, 6 oz (260-435 mg).  Zucchini,  cup (220 mg).  Moderate in potassium The following foods and beverages have 50-200 mg of potassium per serving:  Apple, 1 each (150 mg).  Apple juice,  cup (150 mg).  Applesauce,  cup (90 mg).  Apricot nectar,  cup (140 mg).  Asparagus, small spears,  cup or 6 spears (155 mg).  Bagel, cinnamon raisin, 1 each (130 mg).  Bagel, egg or plain, 4 in., 1 each (70 mg).  Beans, green,  cup (90 mg).  Beans, yellow,  cup (190 mg).  Beer, regular, 12 oz (100 mg).  Beets, canned,  cup (125 mg).  Blackberries,  cup (115 mg).  Blueberries,  cup (60  mg).  Bread, whole wheat, 1 slice (70 mg).  Broccoli, raw,  cup (145 mg).  Cabbage,  cup (150 mg).  Carrots, cooked or raw,  cup (180 mg).  Cauliflower, raw,  cup (150 mg).  Celery, raw,  cup (155 mg).  Cereal, bran flakes, cup (120-150 mg).  Cheese, cottage,  cup (110 mg).  Cherries, 10 each (150 mg).  Chocolate, 1 oz bar (165 mg).  Coffee, brewed 6 oz (90 mg).  Corn,  cup or 1 ear (195 mg).  Cucumbers,  cup (80 mg).  Egg, large, 1 each (60 mg).  Eggplant,  cup (60 mg).  Endive, raw, cup (80 mg).  English muffin, 1 each (65 mg).  Fish, orange roughy, 3 oz (150 mg).  Frankfurter, beef or pork, 1 each (75 mg).  Fruit cocktail,  cup (115 mg).  Grape juice,  cup (170 mg).  Grapefruit,  fruit (175 mg).  Grapes,  cup (155 mg).  Greens: kale, turnip, collard,  cup (110-150 mg).  Ice cream or frozen yogurt, chocolate,  cup (175 mg).  Ice cream or frozen yogurt, vanilla,  cup (120-150 mg).  Lemons, limes, 1 each (80 mg).  Lettuce, all types, 1 cup (100 mg).  Mixed vegetables,  cup (150 mg).  Mushrooms, raw,  cup (110 mg).  Nuts: walnuts, pecans, or macadamia, 1 oz (125 mg).  Oatmeal,  cup (80 mg).  Okra,  cup (110 mg).  Onions, raw,  cup (120 mg).  Peach, 1 each (185 mg).  Peaches, canned,  cup (120 mg).  Pears, canned,  cup (120 mg).  Peas, green, frozen,  cup (90 mg).  Peppers, green,  cup (130 mg).  Peppers, red,  cup (160 mg).  Pineapple juice,  cup (165 mg).  Pineapple, fresh or canned,  cup (100 mg).  Plums, 1 each (105 mg).  Pudding, vanilla,  cup (  150 mg).  Raspberries,  cup (90 mg).  Rhubarb,  cup (115 mg).  Rice, wild,  cup (80 mg).  Shrimp, 3 oz (155 mg).  Spinach, raw, 1 cup (170 mg).  Strawberries,  cup (125 mg).  Summer squash  cup (175-200 mg).  Swiss chard, raw, 1 cup (135 mg).  Tangerines, 1 each (140 mg).  Tea, brewed, 6 oz (65 mg).  Turnips,  cup (140  mg).  Watermelon,  cup (85 mg).  Wine, red, table, 5 oz (180 mg).  Wine, white, table, 5 oz (100 mg).  Low in potassium The following foods and beverages have less than 50 mg of potassium per serving.  Bread, white, 1 slice (30 mg).  Carbonated beverages, 12 oz (less than 5 mg).  Cheese, 1 oz (20-30 mg).  Cranberries,  cup (45 mg).  Cranberry juice cocktail,  cup (20 mg).  Fats and oils, 1 Tbsp (less than 5 mg).  Hummus, 1 Tbsp (32 mg).  Nectar: papaya, mango, or pear,  cup (35 mg).  Rice, white or brown,  cup (50 mg).  Spaghetti or macaroni,  cup cooked (30 mg).  Tortilla, flour or corn, 1 each (50 mg).  Waffle, 4 in., 1 each (50 mg).  Water chestnuts,  cup (40 mg).  This information is not intended to replace advice given to you by your health care provider. Make sure you discuss any questions you have with your health care provider. Document Released: 07/07/2005 Document Revised: 04/30/2016 Document Reviewed: 10/20/2013 Elsevier Interactive Patient Education  [employer].
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
## 2018-01-31 NOTE — Assessment & Plan Note (Addendum)
04/14/2017 Left axillary lymph node biopsy: Metastatic carcinoma positive for CK 7, ER positive,GATA-3 equivocal, negative for CK 20,GCDFP, CDX-2, TTF-1; HER-2 positive ratio 2.08 mammogram and ultrasound revealed 3 discrete abnormal left axillary lymph nodes largest measures 1.8 cm Clinical stage: TX N1MX  Treatment plan: 1. Neoadjuvant TCH Perjeta 6 cycles completed 08/31/2017 followed by Herceptin, Perjeta maintenance for 1 year 2. followed by axillary lymph node dissection. Breast surgery will not be performed because there is no primary tumor identifiable on breast MRI: 10/14/17: 0/13 LN Negative 3. Followed by radiation 4. Followed by antiestrogen therapy ------------------------------------------------------------------------------------------------------------------ Current Treatment:Herceptin Perjeta maintenance therapy for 1 year  Patient received treatment last week.  She came in today for evaluation due to nausea, vomiting, diarrhea.  She had syncopal episode yesterday.  ? Vagal episode.  The nausea, vomiting, diarrhea are either secondary to perjeta, or viral.    She will receive IV Zofran, and IV fluids today.  She will see cardiology tomorrow.  She will call for any questions or concerns.  Red flags reviewed.    Patient met with Dr. Lindi Adie today as well.    F/u with Dr. Lindi Adie on 02/15/18.
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
## 2018-02-01 ENCOUNTER — Encounter (HOSPITAL_COMMUNITY): Payer: Self-pay | Admitting: Cardiology

## 2018-02-01 ENCOUNTER — Ambulatory Visit (HOSPITAL_BASED_OUTPATIENT_CLINIC_OR_DEPARTMENT_OTHER)
Admission: RE | Admit: 2018-02-01 | Discharge: 2018-02-01 | Disposition: A | Discharge status: Home / Self Care | Payer: BLUE CROSS/BLUE SHIELD | Source: Ambulatory Visit | Attending: Cardiology | Admitting: Cardiology

## 2018-02-01 ENCOUNTER — Telehealth: Payer: Self-pay | Admitting: Adult Health

## 2018-02-01 ENCOUNTER — Ambulatory Visit (HOSPITAL_COMMUNITY)
Admission: RE | Admit: 2018-02-01 | Discharge: 2018-02-01 | Disposition: A | Discharge status: Home / Self Care | Payer: BLUE CROSS/BLUE SHIELD | Source: Ambulatory Visit | Attending: Cardiology | Admitting: Cardiology

## 2018-02-01 ENCOUNTER — Other Ambulatory Visit: Payer: Self-pay

## 2018-02-01 VITALS — BP 120/69 | HR 78 | Wt 137.5 lb

## 2018-02-01 DIAGNOSIS — C50612 Malignant neoplasm of axillary tail of left female breast: Secondary | ICD-10-CM

## 2018-02-01 DIAGNOSIS — C801 Malignant (primary) neoplasm, unspecified: Secondary | ICD-10-CM | POA: Diagnosis not present

## 2018-02-01 DIAGNOSIS — Z170 Estrogen receptor positive status [ER+]: Secondary | ICD-10-CM | POA: Diagnosis not present

## 2018-02-01 DIAGNOSIS — I10 Essential (primary) hypertension: Secondary | ICD-10-CM | POA: Insufficient documentation

## 2018-02-01 DIAGNOSIS — Z79811 Long term (current) use of aromatase inhibitors: Secondary | ICD-10-CM | POA: Insufficient documentation

## 2018-02-01 DIAGNOSIS — R55 Syncope and collapse: Secondary | ICD-10-CM | POA: Diagnosis not present

## 2018-02-01 DIAGNOSIS — Z79899 Other long term (current) drug therapy: Secondary | ICD-10-CM | POA: Diagnosis not present

## 2018-02-01 DIAGNOSIS — Z853 Personal history of malignant neoplasm of breast: Secondary | ICD-10-CM | POA: Diagnosis not present

## 2018-02-01 DIAGNOSIS — E785 Hyperlipidemia, unspecified: Secondary | ICD-10-CM | POA: Insufficient documentation

## 2018-02-01 NOTE — Progress Notes (Signed)
Oncology: Dr. Baruch Merl JAXSYN CATALFAMO is a 57 y.o. female with history of breast cancer, HTN, hyperlipidemia was referred for cardio-oncology evaluation by Dr. Lindi Adie.  Axillary node met found initially in 5/18, ER+/PR+/HER2+.  Breast MRI did not show a primary tumor.  Plan for carboplatin/docetaxol/pertuzumab/trastuzumab x 6 cycles then trastuzumab to complete 1 year.  She has had left axillary node dissection in 11/18 and will need radiation.   She presents today for follow up with Echo. Seen in ED 01/30/18 for syncope. Felt to be vasovagal in setting of GI illness. Given 1 L of fluid with improvement of symptoms. Seen in oncology clinic yesterday and given further IV fluids and Zofran. States her stress level has been really high. She has had several episodes of syncope in the past, but has been a long time. She had clear s/s of abdominal cramping and diaphoresis, prior to syncope. Pt also had N/V and diarrhea afterward.  Pt has had unremarkable work up for ~ 30 years of episodic abdominal pain. Today she is feeling much better   PMH: 1. Breast cancer: Axillary node met found initially in 5/18, ER+/PR+/HER2+.  Breast MRI did not show a primary tumor.  Plan for carboplatin/docetaxol/pertuzumab/trastuzumab x 6 cycles then trastuzumab to complete 1 year.  Lymph node dissection L axilla in 11/18.  - Echo (5/18): EF 55-60% - Echo (8/18): EF 60-65%, GLS -18.2%, RV normal size and systolic function.  - Echo (11/18): EF 60-65%, GLS -20.4%, normal RV size and systolic function.  - Echo (2/19): EF 60-65%, GLS -18.7, normal RV size and systolic function.  2. Hyperlipidemia 3. HTN 4. GERD  FH: Mitral valve prolapse.   Social History   Socioeconomic History  . Marital status: Married    Spouse name: None  . Number of children: None  . Years of education: None  . Highest education level: None  Social Needs  . Financial resource strain: None  . Food insecurity - worry: None  . Food insecurity -  inability: None  . Transportation needs - medical: None  . Transportation needs - non-medical: None  Occupational History  . None  Tobacco Use  . Smoking status: Never Smoker  . Smokeless tobacco: Never Used  Substance and Sexual Activity  . Alcohol use: Yes    Alcohol/week: 1.2 oz    Types: 2 Glasses of wine per week    Frequency: Never    Comment: socially  . Drug use: No  . Sexual activity: None  Other Topics Concern  . None  Social History Narrative  . None   Review of systems complete and found to be negative unless listed in HPI.    Current Outpatient Medications  Medication Sig Dispense Refill  . ALPRAZolam (XANAX) 0.5 MG tablet Take 0.5 mg by mouth 3 (three) times daily as needed for anxiety.   0  . anastrozole (ARIMIDEX) 1 MG tablet Take 1 mg by mouth daily.    . calcium carbonate (OS-CAL) 1250 (500 Ca) MG chewable tablet Chew 1 tablet by mouth daily. Calcium 600 mg    . cholecalciferol (VITAMIN D) 1000 units tablet Take 1,000 Units by mouth daily. 2000 iu    . lidocaine-prilocaine (EMLA) cream Apply to affected area once 30 g 3  . loperamide (IMODIUM) 2 MG capsule Take 4 mg by mouth as needed for diarrhea or loose stools.    . methocarbamol (ROBAXIN) 750 MG tablet Take 750 mg by mouth every 8 (eight) hours as needed for muscle spasms.    Marland Kitchen  rosuvastatin (CRESTOR) 10 MG tablet Take 10 mg by mouth daily.   0  . triamterene-hydrochlorothiazide (MAXZIDE-25) 37.5-25 MG tablet Take 1 tablet by mouth daily.    Marland Kitchen zolpidem (AMBIEN) 5 MG tablet Take 1 tablet (5 mg total) at bedtime as needed by mouth for sleep. 30 tablet 3   No current facility-administered medications for this encounter.    Facility-Administered Medications Ordered in Other Encounters  Medication Dose Route Frequency Provider Last Rate Last Dose  . 0.9 %  sodium chloride infusion   Intravenous Continuous Gardenia Phlegm, NP 500 mL/hr at 01/31/18 1434    . sodium chloride flush (NS) 0.9 % injection 10  mL  10 mL Intravenous PRN Nicholas Lose, MD   10 mL at 01/31/18 1414   Vitals:   02/01/18 1206  BP: 120/69  Pulse: 78  SpO2: 100%  Weight: 137 lb 8 oz (62.4 kg)   Wt Readings from Last 3 Encounters:  02/01/18 137 lb 8 oz (62.4 kg)  01/31/18 138 lb 14.4 oz (63 kg)  01/25/18 139 lb (63 kg)    EKG in ED showed NSR 88 bpm.  General: Well appearing. No resp difficulty. HEENT: Normal Neck: Supple. JVP 5-6. Carotids 2+ bilat; no bruits. No thyromegaly or nodule noted. Cor: PMI nondisplaced. RRR, No M/G/R noted Lungs: CTAB, normal effort. Abdomen: Soft, non-tender, non-distended, no HSM. No bruits or masses. +BS  Extremities: No cyanosis, clubbing, or rash. R and LLE no edema.  Neuro: Alert & orientedx3, cranial nerves grossly intact. moves all 4 extremities w/o difficulty. Affect pleasant   1. Breast cancer: Patient continues on Herceptin.   - Todays echo personally reviewed by Dr. Aundra Dubin. EF and strain parameters remain normal.  - She will continue Herceptin, return for office visit and echo in 3 months. (She finishes herceptin in June, so this would be her last visit).  2. HTN: BP controlled on current regimen.  3. Syncope - Strongly suspect vasovagal with history of same, in the midst of high stress and possible GI bug. - If recurs, consider event monitor to r/o cardiac etiology. At this point, very low suspicion  Shirley Friar, PA-C  02/01/2018   Patient seen with PA, agree with the above note.  Echo done today was reviewed, EF and strain remain stable compared to prior.   Syncopal event described above was noted.  Based on history, most likely vasovagal.  If it recurs, will need event monitoring.   Followup in 3 months with echo.  [name] 02/02/2018

## 2018-02-01 NOTE — Telephone Encounter (Signed)
Per 2/25 no los °

## 2018-02-01 NOTE — Progress Notes (Signed)
  Echocardiogram 2D Echocardiogram has been performed.  Neziah Braley T Jalin Alicea 02/01/2018, 11:35 AM

## 2018-02-06 ENCOUNTER — Other Ambulatory Visit: Payer: Self-pay | Admitting: Hematology and Oncology

## 2018-02-11 ENCOUNTER — Encounter: Payer: Self-pay | Admitting: Radiation Oncology

## 2018-02-14 ENCOUNTER — Other Ambulatory Visit: Payer: Self-pay

## 2018-02-14 DIAGNOSIS — C773 Secondary and unspecified malignant neoplasm of axilla and upper limb lymph nodes: Secondary | ICD-10-CM

## 2018-02-15 ENCOUNTER — Inpatient Hospital Stay: Payer: BLUE CROSS/BLUE SHIELD

## 2018-02-15 ENCOUNTER — Encounter: Payer: Self-pay | Admitting: *Deleted

## 2018-02-15 ENCOUNTER — Inpatient Hospital Stay: Payer: BLUE CROSS/BLUE SHIELD | Attending: Hematology and Oncology | Admitting: Hematology and Oncology

## 2018-02-15 VITALS — BP 136/61 | HR 68 | Temp 97.8°F | Resp 17

## 2018-02-15 DIAGNOSIS — Z923 Personal history of irradiation: Secondary | ICD-10-CM | POA: Diagnosis not present

## 2018-02-15 DIAGNOSIS — C50612 Malignant neoplasm of axillary tail of left female breast: Secondary | ICD-10-CM

## 2018-02-15 DIAGNOSIS — C773 Secondary and unspecified malignant neoplasm of axilla and upper limb lymph nodes: Secondary | ICD-10-CM

## 2018-02-15 DIAGNOSIS — Z170 Estrogen receptor positive status [ER+]: Secondary | ICD-10-CM | POA: Insufficient documentation

## 2018-02-15 DIAGNOSIS — Z79899 Other long term (current) drug therapy: Secondary | ICD-10-CM | POA: Diagnosis not present

## 2018-02-15 DIAGNOSIS — Z0001 Encounter for general adult medical examination with abnormal findings: Secondary | ICD-10-CM | POA: Diagnosis not present

## 2018-02-15 DIAGNOSIS — C801 Malignant (primary) neoplasm, unspecified: Secondary | ICD-10-CM | POA: Insufficient documentation

## 2018-02-15 DIAGNOSIS — Z5112 Encounter for antineoplastic immunotherapy: Secondary | ICD-10-CM | POA: Diagnosis not present

## 2018-02-15 DIAGNOSIS — Z79811 Long term (current) use of aromatase inhibitors: Secondary | ICD-10-CM | POA: Insufficient documentation

## 2018-02-15 DIAGNOSIS — C50919 Malignant neoplasm of unspecified site of unspecified female breast: Secondary | ICD-10-CM

## 2018-02-15 DIAGNOSIS — Z95828 Presence of other vascular implants and grafts: Secondary | ICD-10-CM

## 2018-02-15 LAB — CBC WITH DIFFERENTIAL (CANCER CENTER ONLY)
BASOS PCT: 0 %
Basophils Absolute: 0 10*3/uL (ref 0.0–0.1)
EOS ABS: 0.1 10*3/uL (ref 0.0–0.5)
Eosinophils Relative: 1 %
HCT: 36.5 % (ref 34.8–46.6)
HEMOGLOBIN: 12.3 g/dL (ref 11.6–15.9)
LYMPHS ABS: 1.2 10*3/uL (ref 0.9–3.3)
Lymphocytes Relative: 30 %
MCH: 30.1 pg (ref 25.1–34.0)
MCHC: 33.7 g/dL (ref 31.5–36.0)
MCV: 89.5 fL (ref 79.5–101.0)
Monocytes Absolute: 0.3 10*3/uL (ref 0.1–0.9)
Monocytes Relative: 7 %
NEUTROS ABS: 2.5 10*3/uL (ref 1.5–6.5)
Neutrophils Relative %: 62 %
Platelet Count: 256 10*3/uL (ref 145–400)
RBC: 4.08 MIL/uL (ref 3.70–5.45)
RDW: 13.2 % (ref 11.2–14.5)
WBC: 4 10*3/uL (ref 3.9–10.3)

## 2018-02-15 LAB — CMP (CANCER CENTER ONLY)
ALT: 19 U/L (ref 0–55)
ANION GAP: 10 (ref 3–11)
AST: 18 U/L (ref 5–34)
Albumin: 4 g/dL (ref 3.5–5.0)
Alkaline Phosphatase: 84 U/L (ref 40–150)
BUN: 20 mg/dL (ref 7–26)
CHLORIDE: 100 mmol/L (ref 98–109)
CO2: 28 mmol/L (ref 22–29)
CREATININE: 0.96 mg/dL (ref 0.60–1.10)
Calcium: 10.3 mg/dL (ref 8.4–10.4)
GFR, Est AFR Am: >60 mL/min (ref >60)
Glucose, Bld: 121 mg/dL (ref 70–140)
Potassium: 3.3 mmol/L — ABNORMAL LOW (ref 3.5–5.1)
Sodium: 138 mmol/L (ref 136–145)
Total Bilirubin: 0.6 mg/dL (ref 0.2–1.2)
Total Protein: 7.4 g/dL (ref 6.4–8.3)

## 2018-02-15 MED ORDER — TRASTUZUMAB CHEMO 150 MG IV SOLR
6.0000 mg/kg | Freq: Once | INTRAVENOUS | Status: AC
  Administered 2018-02-15: 378 mg via INTRAVENOUS
  Filled 2018-02-15: qty 18

## 2018-02-15 MED ORDER — SODIUM CHLORIDE 0.9% FLUSH
10.0000 mL | INTRAVENOUS | Status: DC | PRN
  Administered 2018-02-15: 10 mL via INTRAVENOUS
  Filled 2018-02-15: qty 10

## 2018-02-15 MED ORDER — SODIUM CHLORIDE 0.9% FLUSH
10.0000 mL | INTRAVENOUS | Status: DC | PRN
  Administered 2018-02-15: 10 mL
  Filled 2018-02-15: qty 10

## 2018-02-15 MED ORDER — DIPHENHYDRAMINE HCL 25 MG PO CAPS
50.0000 mg | ORAL_CAPSULE | Freq: Once | ORAL | Status: AC
  Administered 2018-02-15: 50 mg via ORAL

## 2018-02-15 MED ORDER — DIPHENHYDRAMINE HCL 25 MG PO CAPS
ORAL_CAPSULE | ORAL | Status: AC
  Filled 2018-02-15: qty 2

## 2018-02-15 MED ORDER — SODIUM CHLORIDE 0.9 % IV SOLN
Freq: Once | INTRAVENOUS | Status: AC
  Administered 2018-02-15: 13:00:00 via INTRAVENOUS

## 2018-02-15 MED ORDER — HEPARIN SOD (PORK) LOCK FLUSH 100 UNIT/ML IV SOLN
500.0000 [IU] | Freq: Once | INTRAVENOUS | Status: AC | PRN
  Administered 2018-02-15: 500 [IU]
  Filled 2018-02-15: qty 5

## 2018-02-15 MED ORDER — ACETAMINOPHEN 325 MG PO TABS
650.0000 mg | ORAL_TABLET | Freq: Once | ORAL | Status: AC
  Administered 2018-02-15: 650 mg via ORAL

## 2018-02-15 MED ORDER — ACETAMINOPHEN 325 MG PO TABS
ORAL_TABLET | ORAL | Status: AC
  Filled 2018-02-15: qty 2

## 2018-02-15 MED ORDER — SODIUM CHLORIDE 0.9 % IV SOLN
420.0000 mg | Freq: Once | INTRAVENOUS | Status: AC
  Administered 2018-02-15: 420 mg via INTRAVENOUS
  Filled 2018-02-15: qty 14

## 2018-02-15 NOTE — Progress Notes (Signed)
Pt only stayed 15 min for Perjeta post observation. VSS at D/C.

## 2018-02-15 NOTE — Assessment & Plan Note (Addendum)
04/14/2017 Left axillary lymph node biopsy: Metastatic carcinoma positive for CK 7, ER positive,GATA-3 equivocal, negative for CK 20,GCDFP, CDX-2, TTF-1; HER-2 positive ratio 2.08 mammogram and ultrasound revealed 3 discrete abnormal left axillary lymph nodes largest measures 1.8 cm Clinical stage: TX N1MX  Treatment plan: 1. Neoadjuvant TCH Perjeta 6 cycles completed 08/31/2017 followed by Herceptin, Perjeta maintenance for 1 year 2. followed by axillary lymph node dissection. Breast surgery will not be performed because there is no primary tumor identifiable on breast MRI: 10/14/17: 0/13 LN Negative 3. Followed by radiation 12/08/2017-01/12/2018 4. Followed by antiestrogen therapy started 01/30/2018 ------------------------------------------------------------------------------------------------------------------ Current Treatment:Herceptin Perjeta maintenance therapy for 1 year Toxicities: No side effects to Herceptin and Perjeta  Anastrozole toxicities:  Continue with maintenance therapy with Herceptin and Perjeta every 3 weeks F/U with me in 6 weeks

## 2018-02-15 NOTE — Progress Notes (Signed)
Patient Care Team: Deland Pretty, MD as PCP - General (Internal Medicine)  DIAGNOSIS:  Encounter Diagnosis  Name Primary?  . 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[place])   04/14/2017 Initial Diagnosis    Left axillary lymph node biopsy: Metastatic carcinoma positive for CK 7, ER positive,GATA-3 equivocal, negative for CK 20,GCDFP, CDX-2, TTF-1; HER-2 positive ratio 2.08; mammogram and ultrasound revealed 3 discrete abnormal left axillary lymph nodes largest measures 1.8 cm      04/23/2017 Breast MRI    Left axillary lymphadenopathy numerous enlarged level I axillary lymph nodes largest 2.1 cm, no other abdominal masses in the left breast itself, no MRI evidence of malignancy in the right breast      04/26/2017 Imaging    CT CAP: Prominent left axillary lymph nodes no distant metastases, sclerotic focus left pedicle of T9 vertebra favored benign, T5 vertebral hemangioma      05/12/2017 - 08/31/2017 Neo-Adjuvant Chemotherapy    TCH Perjeta x6 followed by Herceptin Perjeta maintenance for 1 year      10/14/2017 Surgery    Left axillary lymph node dissection: 0/13 lymph nodes complete pathologic response.  Breast was not operated on because no breast primary was ever identified      12/09/2017 - 01/11/2018 Radiation Therapy    Adjuvant radiation therapy      01/30/2018 -  Anti-estrogen oral therapy    Anastrozole 1 mg daily       CHIEF COMPLIANT: Herceptin Perjeta maintenance and anastrozole therapy  INTERVAL HISTORY: Melissa [name] is a 57 year old with above-mentioned history of HER-2 positive Neer positive breast cancer is currently in adjuvant therapy with Herceptin and Perjeta maintenance.  She has 4 more doses and after that she wants to get the port removed.  He was started on anastrozole therapy on 01/26/2018.   She is tolerating it extremely well.  She does not have any hot flashes or myalgias.  REVIEW OF SYSTEMS:   Constitutional: Denies fevers, chills or abnormal weight loss Eyes: Denies blurriness of vision Ears, nose, mouth, throat, and face: Denies mucositis or sore throat Respiratory: Denies cough, dyspnea or wheezes Cardiovascular: Denies palpitation, chest discomfort Gastrointestinal:  Denies nausea, heartburn or change in bowel habits Skin: Denies abnormal skin rashes Lymphatics: Denies new lymphadenopathy or easy bruising Neurological:Denies numbness, tingling or new weaknesses Behavioral/Psych: Mood is stable, no new changes  Extremities: No lower extremity edema All other systems were reviewed with the patient and are negative.  I have reviewed the past medical history, past surgical history, social history and family history with the patient and they are unchanged from previous note.  ALLERGIES:  has No Known Allergies.  MEDICATIONS:  Current Outpatient Medications  Medication Sig Dispense Refill  . ALPRAZolam (XANAX) 0.5 MG tablet Take 0.5 mg by mouth 3 (three) times daily as needed for anxiety.   0  . anastrozole (ARIMIDEX) 1 MG tablet Take 1 mg by mouth daily.    . calcium carbonate (OS-CAL) 1250 (500 Ca) MG chewable tablet Chew 1 tablet by mouth daily. Calcium 600 mg    . cholecalciferol (VITAMIN D) 1000 units tablet Take 1,000 Units by mouth daily. 2000 iu    . lidocaine-prilocaine (EMLA) cream Apply to affected area once 30 g 3  . loperamide (IMODIUM) 2 MG capsule Take 4 mg by mouth as needed  for diarrhea or loose stools.    . methocarbamol (ROBAXIN) 750 MG tablet Take 750 mg by mouth every 8 (eight) hours as needed for muscle spasms.    . rosuvastatin (CRESTOR) 10 MG tablet Take 10 mg by mouth daily.   0  . triamterene-hydrochlorothiazide (MAXZIDE-25) 37.5-25 MG tablet Take 1 tablet by mouth daily.    Marland Kitchen zolpidem (AMBIEN) 5 MG tablet TAKE 1 TABLET BY MOUTH AT BEDTIME AS  NEEDED FOR SLEEP 90 tablet 3   No current facility-administered medications for this visit.     PHYSICAL EXAMINATION: ECOG PERFORMANCE STATUS: 1 - Symptomatic but completely ambulatory  Vitals:   02/15/18 1102  BP: 128/82  Pulse: 77  Resp: 18  Temp: 98 F (36.7 C)  SpO2: 100%   Filed Weights   02/15/18 1102  Weight: 137 lb 12.8 oz (62.5 kg)    GENERAL:alert, no distress and comfortable SKIN: skin color, texture, turgor are normal, no rashes or significant lesions EYES: normal, Conjunctiva are pink and non-injected, sclera clear OROPHARYNX:no exudate, no erythema and lips, buccal mucosa, and tongue normal  NECK: supple, thyroid normal size, non-tender, without nodularity LYMPH:  no palpable lymphadenopathy in the cervical, axillary or inguinal LUNGS: clear to auscultation and percussion with normal breathing effort HEART: regular rate & rhythm and no murmurs and no lower extremity edema ABDOMEN:abdomen soft, non-tender and normal bowel sounds MUSCULOSKELETAL:no cyanosis of digits and no clubbing  NEURO: alert & oriented x 3 with fluent speech, no focal motor/sensory deficits EXTREMITIES: No lower extremity edema BREAST: No palpable masses or nodules in either right or left breasts. No palpable axillary supraclavicular or infraclavicular adenopathy no breast tenderness or nipple discharge. (exam performed in the presence of a chaperone)  LABORATORY DATA:  I have reviewed the data as listed CMP Latest Ref Rng & Units 01/31/2018 01/30/2018 01/04/2018  Glucose 70 - 140 mg/dL 139 131(H) 97  BUN 7 - 26 mg/dL 13 23(H) 17  Creatinine 0.60 - 1.10 mg/dL 0.83 1.03(H) 0.88  Sodium 136 - 145 mmol/L 135(L) 134(L) 140  Potassium 3.5 - 5.1 mmol/L 3.3(L) 3.2(L) 3.8  Chloride 98 - 109 mmol/L 99 95(L) 100  CO2 22 - 29 mmol/L _0 Calcium 8.4 - 10.4 mg/dL 9.5 10.7(H) 10.2  Total Protein 6.4 - 8.3 g/dL 6.7 - 7.6  Total Bilirubin 0.2 - 1.2 mg/dL 0.7 - 0.3  Alkaline Phos 40 - 150 U/L 83 -  79  AST 5 - 34 U/L 18 - 22  ALT 0 - 55 U/L 20 - 19    Lab Results  Component Value Date   WBC 4.0 02/15/2018   HGB 14.1 01/30/2018   HCT 36.5 02/15/2018   MCV 89.5 02/15/2018   PLT 256 02/15/2018   NEUTROABS 2.5 02/15/2018    ASSESSMENT & PLAN:  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 Neoadjuvant TCH Perjeta 6 cycles completed 08/31/2017 followed by Herceptin, Perjeta maintenance for 1 year 2. followed by axillary lymph node dissection. Breast surgery will not be performed because there is no primary tumor identifiable on breast MRI: 10/14/17: 0/13 LN Negative 3. Followed by radiation 12/08/2017-01/12/2018 4. Followed by antiestrogen therapy started 01/30/2018 ------------------------------------------------------------------------------------------------------------------ Current  Treatment:Herceptin Perjeta maintenance therapy for 1 year Toxicities: No side effects to Herceptin and Perjeta  Anastrozole toxicities: Complained of hot flashes which are pretty profound.  However she does not want to take anything for it.  She thinks she can tough it out.  I sent a message to Dr. Donne Hazel to remove her port As soon as she completes her last dose of Herceptin.  Continue with maintenance therapy with Herceptin and Perjeta every 3 weeks I discussed with her about Neratinib and provided her with information on this medicine.  F/U with [place] in 6 weeks   I spent 25 minutes talking to the patient of which more than half was spent in counseling and coordination of care.  No orders of the defined types were placed in this  encounter.  The patient has a good understanding of the overall plan. she agrees with it. she will call with any problems that may develop before the next visit here.   Harriette Ohara, MD 02/15/18

## 2018-02-15 NOTE — Patient Instructions (Signed)
[facility] Discharge Instructions for Patients Receiving Chemotherapy  Today you received the following chemotherapy agents Herceptin; Perjeta  To help prevent nausea and vomiting after your treatment, we encourage you to take your nausea medication as directed   If you develop nausea and vomiting that is not controlled by your nausea medication, call the clinic.   BELOW ARE SYMPTOMS THAT SHOULD BE REPORTED IMMEDIATELY:  *FEVER GREATER THAN 100.5 F  *CHILLS WITH OR WITHOUT FEVER  NAUSEA AND VOMITING THAT IS NOT CONTROLLED WITH YOUR NAUSEA MEDICATION  *UNUSUAL SHORTNESS OF BREATH  *UNUSUAL BRUISING OR BLEEDING  TENDERNESS IN MOUTH AND THROAT WITH OR WITHOUT PRESENCE OF ULCERS  *URINARY PROBLEMS  *BOWEL PROBLEMS  UNUSUAL RASH Items with * indicate a potential emergency and should be followed up as soon as possible.  Feel free to call the clinic should you have any questions or concerns. The clinic phone number is [phone].  Please show the CHEMO ALERT CARD at check-in to the Emergency Department and triage nurse.   

## 2018-02-18 ENCOUNTER — Encounter: Payer: Self-pay | Admitting: Radiation Oncology

## 2018-02-18 ENCOUNTER — Other Ambulatory Visit: Payer: Self-pay

## 2018-02-18 ENCOUNTER — Ambulatory Visit
Admission: RE | Admit: 2018-02-18 | Discharge: 2018-02-18 | Disposition: A | Discharge status: Home / Self Care | Payer: BLUE CROSS/BLUE SHIELD | Source: Ambulatory Visit | Attending: Radiation Oncology | Admitting: Radiation Oncology

## 2018-02-18 VITALS — BP 135/79 | HR 76 | Temp 98.5°F | Wt 139.2 lb

## 2018-02-18 DIAGNOSIS — C773 Secondary and unspecified malignant neoplasm of axilla and upper limb lymph nodes: Secondary | ICD-10-CM | POA: Diagnosis not present

## 2018-02-18 DIAGNOSIS — Z79811 Long term (current) use of aromatase inhibitors: Secondary | ICD-10-CM | POA: Diagnosis not present

## 2018-02-18 DIAGNOSIS — Z170 Estrogen receptor positive status [ER+]: Secondary | ICD-10-CM | POA: Diagnosis not present

## 2018-02-18 DIAGNOSIS — C50612 Malignant neoplasm of axillary tail of left female breast: Secondary | ICD-10-CM | POA: Insufficient documentation

## 2018-02-18 DIAGNOSIS — Z79899 Other long term (current) drug therapy: Secondary | ICD-10-CM | POA: Diagnosis not present

## 2018-02-18 DIAGNOSIS — Z923 Personal history of irradiation: Secondary | ICD-10-CM | POA: Diagnosis not present

## 2018-02-18 HISTORY — DX: Personal history of irradiation: Z92.3

## 2018-02-18 NOTE — Progress Notes (Signed)
Melissa [name] presents for follow up of radiation completed 01/12/18 to her Left Breast. She reports tenderness to her Left Breast. The skin to her Left Breast has healed well. She is applying vitamin E cream every once in a while. She is taking Anastrozole daily and continues Herceptin with Dr. Lindi Adie.   BP 135/79   Pulse 76   Temp 98.5 F (36.9 C)   Wt 139 lb 3.2 oz (63.1 kg)   SpO2 99% Comment: room air  BMI 21.17 kg/m    Wt Readings from Last 3 Encounters:  02/18/18 139 lb 3.2 oz (63.1 kg)  02/15/18 137 lb 12.8 oz (62.5 kg)  02/01/18 137 lb 8 oz (62.4 kg)

## 2018-02-18 NOTE — Progress Notes (Signed)
Radiation Oncology         [phone] ________________________________  Name: Melissa [name] MRN: [id number]  Date: 02/18/2018  DOB: Mar 20, 1961  Follow-Up Visit Note  Outpatient  CC: Deland Pretty, MD  Rolm Bookbinder, MD  Diagnosis and Prior Radiotherapy:    ICD-10-CM   1. 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, estrogen receptor positive ([place]) C50.612    Z17.0   2. 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 Left breast treated to 50 Gy in 25 fractions of 2 Gy  2. Left subclavian/axilla treated to 50 Gy in 25 fractions of 2 Gy  CHIEF COMPLAINT: Here for follow-up and surveillance of left axillary metastatic carcinoma  Narrative:  The patient returns today for routine follow-up of radiation completed 1 month ago to her left breast and subclavian/axilla. She reports tenderness to her left breast, and the skin to her left breast has healed well. She is applying vitamin E cream every once in a while. She is taking anastrozole daily and continues Herceptin with Dr. Lindi Adie.                               ALLERGIES:  has No Known Allergies.  Meds: Current Outpatient Medications  Medication Sig Dispense Refill  . ALPRAZolam (XANAX) 0.5 MG tablet Take 0.5 mg by mouth 3 (three) times daily as needed for anxiety.   0  . anastrozole (ARIMIDEX) 1 MG tablet Take 1 mg by mouth daily.    . calcium carbonate (OS-CAL) 1250 (500 Ca) MG chewable tablet Chew 1 tablet by mouth daily. Calcium 600 mg    . cholecalciferol (VITAMIN D) 1000 units tablet Take 1,000 Units by mouth daily. 2000 iu    . methocarbamol (ROBAXIN) 750 MG tablet Take 750 mg by mouth every 8 (eight) hours as needed for muscle spasms.    . rosuvastatin  (CRESTOR) 10 MG tablet Take 10 mg by mouth daily.   0  . triamterene-hydrochlorothiazide (MAXZIDE-25) 37.5-25 MG tablet Take 1 tablet by mouth daily.    Marland Kitchen zolpidem (AMBIEN) 5 MG tablet TAKE 1 TABLET BY MOUTH AT BEDTIME AS NEEDED FOR SLEEP 90 tablet 3  . lidocaine-prilocaine (EMLA) cream Apply to affected area once 30 g 3  . loperamide (IMODIUM) 2 MG capsule Take 4 mg by mouth as needed for diarrhea or loose stools.     No current facility-administered medications for this encounter.     Physical Findings: The patient is in no acute distress. Patient is alert and oriented.  weight is 139 lb 3.2 oz (63.1 kg). Her temperature is 98.5 F (36.9 C). Her blood pressure is 135/79 and her pulse is 76. Her oxygen saturation is 99%.  excellent skin healing in radiotherapy fields.     Lab Findings: Lab Results  Component Value Date   WBC 4.0 02/15/2018   HGB 14.1 01/30/2018   HCT 36.5 02/15/2018   MCV 89.5 02/15/2018   PLT 256 02/15/2018    Radiographic Findings: No results found.  Impression/Plan: Healing well from radiotherapy to the breast tissue.  Continue skin care with topical Vitamin E Oil and / or lotion for at least 2 more months for further healing.  I encouraged  her to continue with yearly mammography and followup with medical oncology. I will see her back on an as-needed basis. I have encouraged her to call if she has any issues or concerns in the future. I wished her the very best.   _____________________________________   Eppie Gibson, MD  This document serves as a record of services personally performed by Eppie Gibson, MD. It was created on her behalf by Rae Lips, a trained medical scribe. The creation of this record is based on the scribe's personal observations and the provider's statements to them. This document has been checked and approved by the attending provider.

## 2018-02-22 DIAGNOSIS — Z0000 Encounter for general adult medical examination without abnormal findings: Secondary | ICD-10-CM | POA: Diagnosis not present

## 2018-02-22 DIAGNOSIS — Z1212 Encounter for screening for malignant neoplasm of rectum: Secondary | ICD-10-CM | POA: Diagnosis not present

## 2018-02-22 DIAGNOSIS — Z1151 Encounter for screening for human papillomavirus (HPV): Secondary | ICD-10-CM | POA: Diagnosis not present

## 2018-02-22 DIAGNOSIS — Z01419 Encounter for gynecological examination (general) (routine) without abnormal findings: Secondary | ICD-10-CM | POA: Diagnosis not present

## 2018-02-22 DIAGNOSIS — Z23 Encounter for immunization: Secondary | ICD-10-CM | POA: Diagnosis not present

## 2018-03-08 ENCOUNTER — Other Ambulatory Visit: Payer: BLUE CROSS/BLUE SHIELD

## 2018-03-08 ENCOUNTER — Inpatient Hospital Stay: Payer: BLUE CROSS/BLUE SHIELD

## 2018-03-08 ENCOUNTER — Inpatient Hospital Stay: Payer: BLUE CROSS/BLUE SHIELD | Attending: Hematology and Oncology

## 2018-03-08 VITALS — BP 115/79 | HR 67 | Temp 99.2°F | Resp 16

## 2018-03-08 DIAGNOSIS — C773 Secondary and unspecified malignant neoplasm of axilla and upper limb lymph nodes: Secondary | ICD-10-CM | POA: Diagnosis not present

## 2018-03-08 DIAGNOSIS — C50919 Malignant neoplasm of unspecified site of unspecified female breast: Secondary | ICD-10-CM | POA: Diagnosis not present

## 2018-03-08 DIAGNOSIS — R0781 Pleurodynia: Secondary | ICD-10-CM | POA: Diagnosis not present

## 2018-03-08 DIAGNOSIS — Z79811 Long term (current) use of aromatase inhibitors: Secondary | ICD-10-CM | POA: Insufficient documentation

## 2018-03-08 DIAGNOSIS — Z170 Estrogen receptor positive status [ER+]: Secondary | ICD-10-CM | POA: Diagnosis not present

## 2018-03-08 DIAGNOSIS — Z923 Personal history of irradiation: Secondary | ICD-10-CM | POA: Diagnosis not present

## 2018-03-08 DIAGNOSIS — Z79899 Other long term (current) drug therapy: Secondary | ICD-10-CM | POA: Diagnosis not present

## 2018-03-08 DIAGNOSIS — I10 Essential (primary) hypertension: Secondary | ICD-10-CM | POA: Diagnosis not present

## 2018-03-08 DIAGNOSIS — C50612 Malignant neoplasm of axillary tail of left female breast: Secondary | ICD-10-CM

## 2018-03-08 DIAGNOSIS — Z5112 Encounter for antineoplastic immunotherapy: Secondary | ICD-10-CM | POA: Insufficient documentation

## 2018-03-08 MED ORDER — HEPARIN SOD (PORK) LOCK FLUSH 100 UNIT/ML IV SOLN
500.0000 [IU] | Freq: Once | INTRAVENOUS | Status: AC | PRN
  Administered 2018-03-08: 500 [IU]
  Filled 2018-03-08: qty 5

## 2018-03-08 MED ORDER — SODIUM CHLORIDE 0.9 % IV SOLN
Freq: Once | INTRAVENOUS | Status: AC
  Administered 2018-03-08: 11:00:00 via INTRAVENOUS

## 2018-03-08 MED ORDER — ACETAMINOPHEN 325 MG PO TABS
ORAL_TABLET | ORAL | Status: AC
  Filled 2018-03-08: qty 2

## 2018-03-08 MED ORDER — DIPHENHYDRAMINE HCL 25 MG PO CAPS
ORAL_CAPSULE | ORAL | Status: AC
  Filled 2018-03-08: qty 2

## 2018-03-08 MED ORDER — TRASTUZUMAB CHEMO 150 MG IV SOLR
6.0000 mg/kg | Freq: Once | INTRAVENOUS | Status: AC
  Administered 2018-03-08: 378 mg via INTRAVENOUS
  Filled 2018-03-08: qty 18

## 2018-03-08 MED ORDER — ACETAMINOPHEN 325 MG PO TABS
650.0000 mg | ORAL_TABLET | Freq: Once | ORAL | Status: AC
  Administered 2018-03-08: 650 mg via ORAL

## 2018-03-08 MED ORDER — PERTUZUMAB CHEMO INJECTION 420 MG/14ML
420.0000 mg | Freq: Once | INTRAVENOUS | Status: AC
  Administered 2018-03-08: 420 mg via INTRAVENOUS
  Filled 2018-03-08: qty 14

## 2018-03-08 MED ORDER — DIPHENHYDRAMINE HCL 25 MG PO CAPS
50.0000 mg | ORAL_CAPSULE | Freq: Once | ORAL | Status: AC
  Administered 2018-03-08: 50 mg via ORAL

## 2018-03-08 MED ORDER — SODIUM CHLORIDE 0.9% FLUSH
10.0000 mL | INTRAVENOUS | Status: DC | PRN
  Administered 2018-03-08: 10 mL
  Filled 2018-03-08: qty 10

## 2018-03-08 NOTE — Patient Instructions (Signed)
[facility] Discharge Instructions for Patients Receiving Chemotherapy  Today you received the following chemotherapy agents Herceptin; Perjeta  To help prevent nausea and vomiting after your treatment, we encourage you to take your nausea medication as directed   If you develop nausea and vomiting that is not controlled by your nausea medication, call the clinic.   BELOW ARE SYMPTOMS THAT SHOULD BE REPORTED IMMEDIATELY:  *FEVER GREATER THAN 100.5 F  *CHILLS WITH OR WITHOUT FEVER  NAUSEA AND VOMITING THAT IS NOT CONTROLLED WITH YOUR NAUSEA MEDICATION  *UNUSUAL SHORTNESS OF BREATH  *UNUSUAL BRUISING OR BLEEDING  TENDERNESS IN MOUTH AND THROAT WITH OR WITHOUT PRESENCE OF ULCERS  *URINARY PROBLEMS  *BOWEL PROBLEMS  UNUSUAL RASH Items with * indicate a potential emergency and should be followed up as soon as possible.  Feel free to call the clinic should you have any questions or concerns. The clinic phone number is [phone].  Please show the CHEMO ALERT CARD at check-in to the Emergency Department and triage nurse.   

## 2018-03-08 NOTE — Progress Notes (Signed)
Patient declined 30 minute post infusion observation. Patient stayed 15 minutes. Stable at discharge.

## 2018-03-14 DIAGNOSIS — Z90710 Acquired absence of both cervix and uterus: Secondary | ICD-10-CM | POA: Diagnosis not present

## 2018-03-14 DIAGNOSIS — Z8541 Personal history of malignant neoplasm of cervix uteri: Secondary | ICD-10-CM | POA: Diagnosis not present

## 2018-03-28 ENCOUNTER — Other Ambulatory Visit: Payer: Self-pay | Admitting: Adult Health

## 2018-03-28 DIAGNOSIS — Z170 Estrogen receptor positive status [ER+]: Principal | ICD-10-CM

## 2018-03-28 DIAGNOSIS — C50612 Malignant neoplasm of axillary tail of left female breast: Secondary | ICD-10-CM

## 2018-03-28 DIAGNOSIS — C801 Malignant (primary) neoplasm, unspecified: Secondary | ICD-10-CM

## 2018-03-28 DIAGNOSIS — C773 Secondary and unspecified malignant neoplasm of axilla and upper limb lymph nodes: Secondary | ICD-10-CM

## 2018-03-29 ENCOUNTER — Inpatient Hospital Stay: Payer: BLUE CROSS/BLUE SHIELD

## 2018-03-29 ENCOUNTER — Ambulatory Visit (HOSPITAL_COMMUNITY)
Admission: RE | Admit: 2018-03-29 | Discharge: 2018-03-29 | Disposition: A | Discharge status: Home / Self Care | Payer: BLUE CROSS/BLUE SHIELD | Source: Ambulatory Visit | Attending: Adult Health | Admitting: Adult Health

## 2018-03-29 ENCOUNTER — Encounter: Payer: Self-pay | Admitting: Adult Health

## 2018-03-29 ENCOUNTER — Inpatient Hospital Stay (HOSPITAL_BASED_OUTPATIENT_CLINIC_OR_DEPARTMENT_OTHER): Payer: BLUE CROSS/BLUE SHIELD | Admitting: Adult Health

## 2018-03-29 ENCOUNTER — Encounter: Payer: Self-pay | Admitting: *Deleted

## 2018-03-29 VITALS — BP 141/76 | HR 72 | Temp 97.7°F | Resp 18

## 2018-03-29 VITALS — BP 125/88 | HR 77 | Temp 98.6°F | Resp 17 | Ht 68.0 in | Wt 138.2 lb

## 2018-03-29 DIAGNOSIS — Z170 Estrogen receptor positive status [ER+]: Secondary | ICD-10-CM

## 2018-03-29 DIAGNOSIS — Z79811 Long term (current) use of aromatase inhibitors: Secondary | ICD-10-CM

## 2018-03-29 DIAGNOSIS — R0781 Pleurodynia: Secondary | ICD-10-CM | POA: Diagnosis not present

## 2018-03-29 DIAGNOSIS — C773 Secondary and unspecified malignant neoplasm of axilla and upper limb lymph nodes: Secondary | ICD-10-CM

## 2018-03-29 DIAGNOSIS — Z79899 Other long term (current) drug therapy: Secondary | ICD-10-CM | POA: Diagnosis not present

## 2018-03-29 DIAGNOSIS — I10 Essential (primary) hypertension: Secondary | ICD-10-CM

## 2018-03-29 DIAGNOSIS — C801 Malignant (primary) neoplasm, unspecified: Secondary | ICD-10-CM

## 2018-03-29 DIAGNOSIS — C50919 Malignant neoplasm of unspecified site of unspecified female breast: Secondary | ICD-10-CM

## 2018-03-29 DIAGNOSIS — R918 Other nonspecific abnormal finding of lung field: Secondary | ICD-10-CM | POA: Diagnosis not present

## 2018-03-29 DIAGNOSIS — Z5112 Encounter for antineoplastic immunotherapy: Secondary | ICD-10-CM | POA: Diagnosis not present

## 2018-03-29 DIAGNOSIS — C50612 Malignant neoplasm of axillary tail of left female breast: Secondary | ICD-10-CM

## 2018-03-29 DIAGNOSIS — Z923 Personal history of irradiation: Secondary | ICD-10-CM

## 2018-03-29 DIAGNOSIS — R079 Chest pain, unspecified: Secondary | ICD-10-CM | POA: Diagnosis not present

## 2018-03-29 LAB — CMP (CANCER CENTER ONLY)
ALT: 15 U/L (ref 0–55)
AST: 22 U/L (ref 5–34)
Albumin: 3.7 g/dL (ref 3.5–5.0)
Alkaline Phosphatase: 89 U/L (ref 40–150)
Anion gap: 10 (ref 3–11)
BUN: 22 mg/dL (ref 7–26)
CO2: 26 mmol/L (ref 22–29)
Calcium: 9.9 mg/dL (ref 8.4–10.4)
Chloride: 102 mmol/L (ref 98–109)
Creatinine: 0.9 mg/dL (ref 0.60–1.10)
GFR, Estimated: >60 mL/min (ref >60)
Glucose, Bld: 80 mg/dL (ref 70–140)
POTASSIUM: 4 mmol/L (ref 3.5–5.1)
SODIUM: 138 mmol/L (ref 136–145)
Total Bilirubin: 0.3 mg/dL (ref 0.2–1.2)
Total Protein: 7.1 g/dL (ref 6.4–8.3)

## 2018-03-29 LAB — CBC WITH DIFFERENTIAL (CANCER CENTER ONLY)
Basophils Absolute: 0 10*3/uL (ref 0.0–0.1)
Basophils Relative: 0 %
Eosinophils Absolute: 0.1 10*3/uL (ref 0.0–0.5)
Eosinophils Relative: 1 %
HCT: 33.5 % — ABNORMAL LOW (ref 34.8–46.6)
HEMOGLOBIN: 11.2 g/dL — AB (ref 11.6–15.9)
LYMPHS ABS: 1.6 10*3/uL (ref 0.9–3.3)
LYMPHS PCT: 33 %
MCH: 30.2 pg (ref 25.1–34.0)
MCHC: 33.4 g/dL (ref 31.5–36.0)
MCV: 90.3 fL (ref 79.5–101.0)
MONOS PCT: 7 %
Monocytes Absolute: 0.4 10*3/uL (ref 0.1–0.9)
NEUTROS PCT: 59 %
Neutro Abs: 2.8 10*3/uL (ref 1.5–6.5)
Platelet Count: 320 10*3/uL (ref 145–400)
RBC: 3.71 MIL/uL (ref 3.70–5.45)
RDW: 13.1 % (ref 11.2–14.5)
WBC Count: 4.8 10*3/uL (ref 3.9–10.3)

## 2018-03-29 MED ORDER — SODIUM CHLORIDE 0.9 % IV SOLN
420.0000 mg | Freq: Once | INTRAVENOUS | Status: AC
  Administered 2018-03-29: 420 mg via INTRAVENOUS
  Filled 2018-03-29: qty 14

## 2018-03-29 MED ORDER — SODIUM CHLORIDE 0.9 % IV SOLN
Freq: Once | INTRAVENOUS | Status: AC
  Administered 2018-03-29: 12:00:00 via INTRAVENOUS

## 2018-03-29 MED ORDER — HEPARIN SOD (PORK) LOCK FLUSH 100 UNIT/ML IV SOLN
500.0000 [IU] | Freq: Once | INTRAVENOUS | Status: AC | PRN
  Administered 2018-03-29: 500 [IU]
  Filled 2018-03-29: qty 5

## 2018-03-29 MED ORDER — DIPHENHYDRAMINE HCL 25 MG PO CAPS
50.0000 mg | ORAL_CAPSULE | Freq: Once | ORAL | Status: AC
  Administered 2018-03-29: 50 mg via ORAL

## 2018-03-29 MED ORDER — SODIUM CHLORIDE 0.9% FLUSH
10.0000 mL | INTRAVENOUS | Status: DC | PRN
  Administered 2018-03-29: 10 mL
  Filled 2018-03-29: qty 10

## 2018-03-29 MED ORDER — ACETAMINOPHEN 325 MG PO TABS
650.0000 mg | ORAL_TABLET | Freq: Once | ORAL | Status: AC
  Administered 2018-03-29: 650 mg via ORAL

## 2018-03-29 MED ORDER — SODIUM CHLORIDE 0.9 % IV SOLN
6.0000 mg/kg | Freq: Once | INTRAVENOUS | Status: AC
  Administered 2018-03-29: 378 mg via INTRAVENOUS
  Filled 2018-03-29: qty 18

## 2018-03-29 MED ORDER — DIPHENHYDRAMINE HCL 25 MG PO CAPS
ORAL_CAPSULE | ORAL | Status: AC
  Filled 2018-03-29: qty 2

## 2018-03-29 MED ORDER — ACETAMINOPHEN 325 MG PO TABS
ORAL_TABLET | ORAL | Status: AC
Start: 2018-03-29 — End: 2018-03-29
  Filled 2018-03-29: qty 2

## 2018-03-29 NOTE — Progress Notes (Signed)
[facility] Follow up:    Deland Pretty, MD [address] [place] [address]   DIAGNOSIS: Cancer Staging 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[place]) Staging form: Breast, AJCC 8th Edition - Clinical: Stage Unknown (cTX, cN1(f), cM0, ER: Positive, PR: Negative, HER2: Positive) - Unsigned   SUMMARY OF ONCOLOGIC HISTORY:   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[place])   04/14/2017 Initial Diagnosis    Left axillary lymph node biopsy: Metastatic carcinoma positive for CK 7, ER positive,GATA-3 equivocal, negative for CK 20,GCDFP, CDX-2, TTF-1; HER-2 positive ratio 2.08; mammogram and ultrasound revealed 3 discrete abnormal left axillary lymph nodes largest measures 1.8 cm      04/23/2017 Breast MRI    Left axillary lymphadenopathy numerous enlarged level I axillary lymph nodes largest 2.1 cm, no other abdominal masses in the left breast itself, no MRI evidence of malignancy in the right breast      04/26/2017 Imaging    CT CAP: Prominent left axillary lymph nodes no distant metastases, sclerotic focus left pedicle of T9 vertebra favored benign, T5 vertebral hemangioma      05/12/2017 - 08/31/2017 Neo-Adjuvant Chemotherapy    TCH Perjeta x6 followed by Herceptin Perjeta maintenance for 1 year      10/14/2017 Surgery    Left axillary lymph node dissection: 0/13 lymph nodes complete pathologic response.  Breast was not operated on because no breast primary was ever identified      12/09/2017 - 01/11/2018 Radiation Therapy    Adjuvant radiation therapy      01/30/2018 -  Anti-estrogen oral therapy    Anastrozole 1 mg daily       CURRENT THERAPY: Herceptin/Perjeta and Anastrozole  INTERVAL HISTORY: Elza Rafter 58 y.o. female returns for evaluation of her triple positive breast cancer.  She is taking Anastrozole daily and receives her maintenance Herceptin every  three weeks.  She is achy from time to time.  She notes some pain in her feet worse at the end of the day.  She is frustrated because she knows that she needs to give her body more time to heal, she just is ready for things to go back to normal.     Patient Active Problem List   Diagnosis Date Noted  . Secondary malignant neoplasm of axillary lymph nodes ([place]) 11/17/2017  . Breast cancer, left ([place]) 10/14/2017  . Port catheter in place 07/20/2017  . 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 Malignant (primary) neoplasm, unspecified ([place]) 04/26/2017  . 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[place]) 04/21/2017    has No Known Allergies.  MEDICAL HISTORY: Past Medical History:  Diagnosis Date  . Breast cancer ([place]) 04/2017   left breast  . Cervical cancer ([place])    1990, treated with hysterectomy only  . GERD (gastroesophageal reflux disease)   . History of radiation therapy 12/09/17- 01/12/18   Left Breast treated to 50 Gy with 25 fx of 2 Gy  . Hypercholesteremia   . Hypertension     SURGICAL HISTORY: Past Surgical History:  Procedure Laterality Date  . ABDOMINAL HYSTERECTOMY  1990  . AXILLARY LYMPH NODE DISSECTION Left 10/14/2017   Procedure: LEFT AXILLARY LYMPH NODE DISSECTION;  Surgeon: Rolm Bookbinder, MD;  Location: [facility];  Service: General;  Laterality: Left;  . EYE SURGERY  eye lift  . PORTACATH PLACEMENT Right 05/12/2017   Procedure: INSERTION PORT-A-CATH WITH ULTRASOUND;  Surgeon: Rolm Bookbinder, MD;  Location: [facility];  Service: General;  Laterality: Right;    SOCIAL HISTORY: Social History   Socioeconomic History  . Marital status: Married    Spouse name: Not on file  . Number of children: Not on file  . Years of education: Not on file  . Highest education level: Not on file  Occupational History  . Not on file   Social Needs  . Financial resource strain: Not on file  . Food insecurity:    Worry: Not on file    Inability: Not on file  . Transportation needs:    Medical: Not on file    Non-medical: Not on file  Tobacco Use  . Smoking status: Never Smoker  . Smokeless tobacco: Never Used  Substance and Sexual Activity  . Alcohol use: Yes    Alcohol/week: 1.2 oz    Types: 2 Glasses of wine per week    Frequency: Never    Comment: socially  . Drug use: No  . Sexual activity: Not on file  Lifestyle  . Physical activity:    Days per week: Not on file    Minutes per session: Not on file  . Stress: Not on file  Relationships  . Social connections:    Talks on phone: Not on file    Gets together: Not on file    Attends religious service: Not on file    Active member of club or organization: Not on file    Attends meetings of clubs or organizations: Not on file    Relationship status: Not on file  . Intimate partner violence:    Fear of current or ex partner: Not on file    Emotionally abused: Not on file    Physically abused: Not on file    Forced sexual activity: Not on file  Other Topics Concern  . Not on file  Social History Narrative  . Not on file    FAMILY HISTORY: History reviewed. No pertinent family history.  Review of Systems  Constitutional: Positive for fatigue. Negative for appetite change, chills, fever and unexpected weight change.  HENT:   Negative for hearing loss and lump/mass.   Eyes: Negative for eye problems and icterus.  Respiratory: Negative for chest tightness, cough and shortness of breath.   Cardiovascular: Negative for chest pain, leg swelling and palpitations.  Gastrointestinal: Negative for abdominal distention, abdominal pain, constipation, diarrhea, nausea and vomiting.  Endocrine: Negative for hot flashes.  Musculoskeletal: Positive for arthralgias.  Skin: Negative for itching and rash.  Neurological: Negative for dizziness and numbness.   Hematological: Negative for adenopathy. Does not bruise/bleed easily.      PHYSICAL EXAMINATION  ECOG PERFORMANCE STATUS: 1 - Symptomatic but completely ambulatory  Vitals:   03/29/18 1117  BP: 125/88  Pulse: 77  Resp: 17  Temp: 98.6 F (37 C)  SpO2: 100%    Physical Exam  Constitutional: She is oriented to person, place, and time and well-developed, well-nourished, and in no distress.  HENT:  Head: Normocephalic and atraumatic.  Mouth/Throat: Oropharynx is clear and moist. No oropharyngeal exudate.  Eyes: Pupils are equal, round, and reactive to light. No scleral icterus.  Neck: Neck supple.  Cardiovascular: Normal rate, regular rhythm and normal heart sounds.  Pulmonary/Chest: Effort normal and breath sounds normal. No respiratory distress. She has no wheezes. She has no rales.  Abdominal:  Soft. Bowel sounds are normal. She exhibits no distension and no mass. There is no tenderness. There is no rebound and no guarding.  Musculoskeletal: She exhibits no edema.  Lymphadenopathy:    She has no cervical adenopathy.  Neurological: She is alert and oriented to person, place, and time.  Skin: Skin is warm and dry. No rash noted. No erythema.  Psychiatric: Mood and affect normal.    LABORATORY DATA:  CBC    Component Value Date/Time   WBC 4.8 03/29/2018 1031   WBC 8.1 01/30/2018 1611   RBC 3.71 03/29/2018 1031   HGB 11.2 (L) 03/29/2018 1031   HGB 11.3 (L) 11/23/2017 1033   HCT 33.5 (L) 03/29/2018 1031   HCT 33.3 (L) 11/23/2017 1033   PLT 320 03/29/2018 1031   PLT 245 11/23/2017 1033   MCV 90.3 03/29/2018 1031   MCV 95.4 11/23/2017 1033   MCH 30.2 03/29/2018 1031   MCHC 33.4 03/29/2018 1031   RDW 13.1 03/29/2018 1031   RDW 12.3 11/23/2017 1033   LYMPHSABS 1.6 03/29/2018 1031   LYMPHSABS 2.3 11/23/2017 1033   MONOABS 0.4 03/29/2018 1031   MONOABS 0.3 11/23/2017 1033   EOSABS 0.1 03/29/2018 1031   EOSABS 0.1 11/23/2017 1033   BASOSABS 0.0 03/29/2018 1031    BASOSABS 0.0 11/23/2017 1033    CMP     Component Value Date/Time   NA 138 03/29/2018 1031   NA 140 11/23/2017 1032   K 4.0 03/29/2018 1031   K 3.5 11/23/2017 1032   CL 102 03/29/2018 1031   CO2 26 03/29/2018 1031   CO2 28 11/23/2017 1032   GLUCOSE 80 03/29/2018 1031   GLUCOSE 94 11/23/2017 1032   BUN 22 03/29/2018 1031   BUN 16.3 11/23/2017 1032   CREATININE 0.90 03/29/2018 1031   CREATININE 0.8 11/23/2017 1032   CALCIUM 9.9 03/29/2018 1031   CALCIUM 9.8 11/23/2017 1032   PROT 7.1 03/29/2018 1031   PROT 6.9 11/23/2017 1032   ALBUMIN 3.7 03/29/2018 1031   ALBUMIN 3.8 11/23/2017 1032   AST 22 03/29/2018 1031   AST 20 11/23/2017 1032   ALT 15 03/29/2018 1031   ALT 16 11/23/2017 1032   ALKPHOS 89 03/29/2018 1031   ALKPHOS 69 11/23/2017 1032   BILITOT 0.3 03/29/2018 1031   BILITOT 0.52 11/23/2017 1032   GFRNONAA >60 03/29/2018 1031   GFRAA >60 03/29/2018 1031      ASSESSMENT and THERAPY PLAN:   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[place]) 04/14/2017 Left axillary lymph node biopsy: Metastatic carcinoma positive for CK 7, ER positive,GATA-3 equivocal, negative for CK 20,GCDFP, CDX-2, TTF-1; HER-2 positive ratio 2.08 mammogram and ultrasound revealed 3 discrete abnormal left axillary lymph nodes largest measures 1.8 cm Clinical stage: TX N1MX  Treatment plan: 1. Neoadjuvant TCH Perjeta 6 cycles completed 08/31/2017 followed by Herceptin, Perjeta maintenance for 1 year 2. followed by axillary lymph node dissection. Breast surgery will not be performed because there is no primary tumor identifiable on breast MRI: 10/14/17: 0/13 LN Negative 3. Followed by radiation 12/08/2017-01/12/2018 4. Followed by antiestrogen therapy started 01/30/2018 ------------------------------------------------------------------------------------------------------------------ Current Treatment:Herceptin Perjeta maintenance therapy for 1 year/Anastrozole  daily Toxicities: No side effects to Herceptin and Perjeta  Verdis and I reviewed her achiness and that it could be a side effect of Anastrozole.  She and I reviewed that her body is still healing and that it will take some time to heal.  She will continue with her current treatment  plan.  She will return every 3 weeks for Herceptin/Perjeta and in 6 weeks for f/u with Dr. Lindi Adie.        Orders Placed This Encounter  Procedures  . DG Chest 2 View    Standing Status:   Future    Number of Occurrences:   1    Standing Expiration Date:   03/29/2019    Order Specific Question:   Reason for Exam (SYMPTOM  OR DIAGNOSIS REQUIRED)    Answer:   pleuritic chest pain, h/o breast cancer    Order Specific Question:   Is patient pregnant?    Answer:   No    Order Specific Question:   Preferred imaging location?    Answer:   [facility]    Order Specific Question:   Radiology Contrast Protocol - do NOT remove file path    Answer:   [email or web address]    All questions were answered. The patient knows to call the clinic with any problems, questions or concerns. We can certainly see the patient much sooner if necessary.  A total of (30) minutes of face-to-face time was spent with this patient with greater than 50% of that time in counseling and care-coordination.  This note was electronically signed.  Scot Dock, NP 03/29/2018

## 2018-03-29 NOTE — Progress Notes (Signed)
Patient did not want to stay for the full 30 minute observation. Vital signs obtained prior to d/c.

## 2018-03-29 NOTE — Patient Instructions (Signed)
[facility] Discharge Instructions for Patients Receiving Chemotherapy  Today you received the following chemotherapy agents herceptin/perjeta  To help prevent nausea and vomiting after your treatment, we encourage you to take your nausea medication as directed   If you develop nausea and vomiting that is not controlled by your nausea medication, call the clinic.   BELOW ARE SYMPTOMS THAT SHOULD BE REPORTED IMMEDIATELY:  *FEVER GREATER THAN 100.5 F  *CHILLS WITH OR WITHOUT FEVER  NAUSEA AND VOMITING THAT IS NOT CONTROLLED WITH YOUR NAUSEA MEDICATION  *UNUSUAL SHORTNESS OF BREATH  *UNUSUAL BRUISING OR BLEEDING  TENDERNESS IN MOUTH AND THROAT WITH OR WITHOUT PRESENCE OF ULCERS  *URINARY PROBLEMS  *BOWEL PROBLEMS  UNUSUAL RASH Items with * indicate a potential emergency and should be followed up as soon as possible.  Feel free to call the clinic you have any questions or concerns. The clinic phone number is [phone].  

## 2018-03-29 NOTE — Assessment & Plan Note (Addendum)
04/14/2017 Left axillary lymph node biopsy: Metastatic carcinoma positive for CK 7, ER positive,GATA-3 equivocal, negative for CK 20,GCDFP, CDX-2, TTF-1; HER-2 positive ratio 2.08 mammogram and ultrasound revealed 3 discrete abnormal left axillary lymph nodes largest measures 1.8 cm Clinical stage: TX N1MX  Treatment plan: 1. Neoadjuvant TCH Perjeta 6 cycles completed 08/31/2017 followed by Herceptin, Perjeta maintenance for 1 year 2. followed by axillary lymph node dissection. Breast surgery will not be performed because there is no primary tumor identifiable on breast MRI: 10/14/17: 0/13 LN Negative 3. Followed by radiation 12/08/2017-01/12/2018 4. Followed by antiestrogen therapy started 01/30/2018 ------------------------------------------------------------------------------------------------------------------ Current Treatment:Herceptin Perjeta maintenance therapy for 1 year/Anastrozole daily Toxicities: No side effects to Herceptin and Perjeta  Melissa [name] and I reviewed her achiness and that it could be a side effect of Anastrozole.  She and I reviewed that her body is still healing and that it will take some time to heal.  She will continue with her current treatment plan.  She will return every 3 weeks for Herceptin/Perjeta and in 6 weeks for f/u with Dr. Gudena.      

## 2018-03-30 ENCOUNTER — Telehealth: Payer: Self-pay | Admitting: *Deleted

## 2018-03-30 ENCOUNTER — Telehealth: Payer: Self-pay

## 2018-03-30 MED ORDER — DOXYCYCLINE HYCLATE 100 MG PO TABS: 100.0000 mg | ORAL_TABLET | Freq: Two times a day (BID) | ORAL | 0 refills | Status: DC

## 2018-03-30 NOTE — Telephone Encounter (Signed)
Per 4/23 no los 

## 2018-03-30 NOTE — Telephone Encounter (Signed)
This RN spoke with per per xray results and need for antibiotic. Verified pharmacy and prescription sent per [facility]/NP

## 2018-03-31 ENCOUNTER — Telehealth: Payer: Self-pay

## 2018-03-31 ENCOUNTER — Other Ambulatory Visit: Payer: Self-pay

## 2018-03-31 DIAGNOSIS — R0781 Pleurodynia: Secondary | ICD-10-CM

## 2018-03-31 NOTE — Telephone Encounter (Signed)
"  Calling in reference to chest xray results you folks called to me about yesterday.  I have a couple questions.  Call me [phone]."

## 2018-03-31 NOTE — Telephone Encounter (Signed)
Pt called and wanted to know more about her cxr results. Explained to pt more about her cxr and that she will need to have another one done, a few weeks after she has completed antibiotic therapy. Pt verbalized understanding.

## 2018-03-31 NOTE — Telephone Encounter (Signed)
Already called and spoke with pt. Thank you.

## 2018-04-14 ENCOUNTER — Telehealth: Payer: Self-pay

## 2018-04-14 ENCOUNTER — Telehealth: Payer: Self-pay | Admitting: *Deleted

## 2018-04-14 NOTE — Telephone Encounter (Signed)
Voicemail forwarded to collaborative to assist with "questions about beach, lakes and public pools for port-cath still apply at this time.  Going to the beach today."

## 2018-04-14 NOTE — Telephone Encounter (Signed)
Returned pt's call regarding she is going to the beach and said when she first got her port last year, was told not to get in water, but wants to know now that it's been a year, is it ok to get in beach or pool due.  I let pt know that it was probably due to just having the port surgery and heal time so now ok to get in water as long as there are no obvious issues at the port area and her skin is intact.  Pt voiced understanding. No other needs per pt at this time.

## 2018-04-19 ENCOUNTER — Other Ambulatory Visit: Payer: Self-pay | Admitting: Adult Health

## 2018-04-19 ENCOUNTER — Inpatient Hospital Stay: Payer: BLUE CROSS/BLUE SHIELD | Attending: Hematology and Oncology

## 2018-04-19 ENCOUNTER — Ambulatory Visit (HOSPITAL_COMMUNITY)
Admission: RE | Admit: 2018-04-19 | Discharge: 2018-04-19 | Disposition: A | Discharge status: Home / Self Care | Payer: BLUE CROSS/BLUE SHIELD | Source: Ambulatory Visit | Attending: Adult Health | Admitting: Adult Health

## 2018-04-19 ENCOUNTER — Inpatient Hospital Stay: Payer: BLUE CROSS/BLUE SHIELD

## 2018-04-19 VITALS — BP 126/63 | HR 66 | Temp 97.8°F | Resp 18

## 2018-04-19 DIAGNOSIS — Z170 Estrogen receptor positive status [ER+]: Principal | ICD-10-CM

## 2018-04-19 DIAGNOSIS — I700 Atherosclerosis of aorta: Secondary | ICD-10-CM | POA: Insufficient documentation

## 2018-04-19 DIAGNOSIS — J189 Pneumonia, unspecified organism: Secondary | ICD-10-CM | POA: Diagnosis not present

## 2018-04-19 DIAGNOSIS — Z95828 Presence of other vascular implants and grafts: Secondary | ICD-10-CM

## 2018-04-19 DIAGNOSIS — R0781 Pleurodynia: Secondary | ICD-10-CM | POA: Diagnosis not present

## 2018-04-19 DIAGNOSIS — C773 Secondary and unspecified malignant neoplasm of axilla and upper limb lymph nodes: Secondary | ICD-10-CM | POA: Insufficient documentation

## 2018-04-19 DIAGNOSIS — Z79899 Other long term (current) drug therapy: Secondary | ICD-10-CM | POA: Diagnosis not present

## 2018-04-19 DIAGNOSIS — C50612 Malignant neoplasm of axillary tail of left female breast: Secondary | ICD-10-CM

## 2018-04-19 DIAGNOSIS — C801 Malignant (primary) neoplasm, unspecified: Secondary | ICD-10-CM

## 2018-04-19 DIAGNOSIS — Z5112 Encounter for antineoplastic immunotherapy: Secondary | ICD-10-CM | POA: Diagnosis not present

## 2018-04-19 DIAGNOSIS — C50919 Malignant neoplasm of unspecified site of unspecified female breast: Secondary | ICD-10-CM | POA: Insufficient documentation

## 2018-04-19 LAB — CBC WITH DIFFERENTIAL (CANCER CENTER ONLY)
Basophils Absolute: 0 10*3/uL (ref 0.0–0.1)
Basophils Relative: 0 %
Eosinophils Absolute: 0 10*3/uL (ref 0.0–0.5)
Eosinophils Relative: 1 %
HCT: 34.3 % — ABNORMAL LOW (ref 34.8–46.6)
Hemoglobin: 11.7 g/dL (ref 11.6–15.9)
Lymphocytes Relative: 30 %
Lymphs Abs: 1.1 10*3/uL (ref 0.9–3.3)
MCH: 30.9 pg (ref 25.1–34.0)
MCHC: 34 g/dL (ref 31.5–36.0)
MCV: 90.8 fL (ref 79.5–101.0)
Monocytes Absolute: 0.2 10*3/uL (ref 0.1–0.9)
Monocytes Relative: 6 %
Neutro Abs: 2.3 10*3/uL (ref 1.5–6.5)
Neutrophils Relative %: 63 %
Platelet Count: 235 10*3/uL (ref 145–400)
RBC: 3.78 MIL/uL (ref 3.70–5.45)
RDW: 13.4 % (ref 11.2–14.5)
WBC Count: 3.6 10*3/uL — ABNORMAL LOW (ref 3.9–10.3)

## 2018-04-19 LAB — CMP (CANCER CENTER ONLY)
ALT: 19 U/L (ref 0–55)
AST: 19 U/L (ref 5–34)
Albumin: 3.9 g/dL (ref 3.5–5.0)
Alkaline Phosphatase: 80 U/L (ref 40–150)
Anion gap: 9 (ref 3–11)
BUN: 19 mg/dL (ref 7–26)
CO2: 30 mmol/L — ABNORMAL HIGH (ref 22–29)
Calcium: 10 mg/dL (ref 8.4–10.4)
Chloride: 101 mmol/L (ref 98–109)
Creatinine: 0.86 mg/dL (ref 0.60–1.10)
GFR, Est AFR Am: >60 mL/min (ref >60)
GFR, Estimated: >60 mL/min (ref >60)
Glucose, Bld: 108 mg/dL (ref 70–140)
Potassium: 3.3 mmol/L — ABNORMAL LOW (ref 3.5–5.1)
Sodium: 140 mmol/L (ref 136–145)
Total Bilirubin: 0.3 mg/dL (ref 0.2–1.2)
Total Protein: 7 g/dL (ref 6.4–8.3)

## 2018-04-19 MED ORDER — DIPHENHYDRAMINE HCL 25 MG PO CAPS
50.0000 mg | ORAL_CAPSULE | Freq: Once | ORAL | Status: AC
  Administered 2018-04-19: 50 mg via ORAL

## 2018-04-19 MED ORDER — SODIUM CHLORIDE 0.9% FLUSH
10.0000 mL | INTRAVENOUS | Status: DC | PRN
  Administered 2018-04-19: 10 mL
  Filled 2018-04-19: qty 10

## 2018-04-19 MED ORDER — HEPARIN SOD (PORK) LOCK FLUSH 100 UNIT/ML IV SOLN
500.0000 [IU] | Freq: Once | INTRAVENOUS | Status: AC | PRN
  Administered 2018-04-19: 500 [IU]
  Filled 2018-04-19: qty 5

## 2018-04-19 MED ORDER — ACETAMINOPHEN 325 MG PO TABS
650.0000 mg | ORAL_TABLET | Freq: Once | ORAL | Status: AC
  Administered 2018-04-19: 650 mg via ORAL

## 2018-04-19 MED ORDER — SODIUM CHLORIDE 0.9 % IV SOLN
Freq: Once | INTRAVENOUS | Status: AC
  Administered 2018-04-19: 12:00:00 via INTRAVENOUS

## 2018-04-19 MED ORDER — DIPHENHYDRAMINE HCL 25 MG PO CAPS
ORAL_CAPSULE | ORAL | Status: AC
  Filled 2018-04-19: qty 2

## 2018-04-19 MED ORDER — SODIUM CHLORIDE 0.9 % IV SOLN
420.0000 mg | Freq: Once | INTRAVENOUS | Status: AC
  Administered 2018-04-19: 420 mg via INTRAVENOUS
  Filled 2018-04-19: qty 14

## 2018-04-19 MED ORDER — SODIUM CHLORIDE 0.9% FLUSH
10.0000 mL | INTRAVENOUS | Status: DC | PRN
  Administered 2018-04-19: 10 mL via INTRAVENOUS
  Filled 2018-04-19: qty 10

## 2018-04-19 MED ORDER — ACETAMINOPHEN 325 MG PO TABS
ORAL_TABLET | ORAL | Status: AC
  Filled 2018-04-19: qty 2

## 2018-04-19 MED ORDER — TRASTUZUMAB CHEMO 150 MG IV SOLR
6.0000 mg/kg | Freq: Once | INTRAVENOUS | Status: AC
  Administered 2018-04-19: 378 mg via INTRAVENOUS
  Filled 2018-04-19: qty 18

## 2018-04-19 NOTE — Patient Instructions (Signed)
[facility] Discharge Instructions for Patients Receiving Chemotherapy  Today you received the following chemotherapy agents Herceptin and Perjeta.   To help prevent nausea and vomiting after your treatment, we encourage you to take your nausea medication as directed.   If you develop nausea and vomiting that is not controlled by your nausea medication, call the clinic.   BELOW ARE SYMPTOMS THAT SHOULD BE REPORTED IMMEDIATELY:  *FEVER GREATER THAN 100.5 F  *CHILLS WITH OR WITHOUT FEVER  NAUSEA AND VOMITING THAT IS NOT CONTROLLED WITH YOUR NAUSEA MEDICATION  *UNUSUAL SHORTNESS OF BREATH  *UNUSUAL BRUISING OR BLEEDING  TENDERNESS IN MOUTH AND THROAT WITH OR WITHOUT PRESENCE OF ULCERS  *URINARY PROBLEMS  *BOWEL PROBLEMS  UNUSUAL RASH Items with * indicate a potential emergency and should be followed up as soon as possible.  Feel free to call the clinic should you have any questions or concerns. The clinic phone number is [phone].  Please show the CHEMO ALERT CARD at check-in to the Emergency Department and triage nurse.   

## 2018-04-19 NOTE — Progress Notes (Signed)
Patient declines to stay for 30 minute post perjeta observation.

## 2018-04-20 ENCOUNTER — Other Ambulatory Visit: Payer: Self-pay | Admitting: Adult Health

## 2018-04-20 ENCOUNTER — Ambulatory Visit
Admission: RE | Admit: 2018-04-20 | Discharge: 2018-04-20 | Disposition: A | Discharge status: Home / Self Care | Payer: BLUE CROSS/BLUE SHIELD | Source: Ambulatory Visit | Attending: Hematology and Oncology | Admitting: Hematology and Oncology

## 2018-04-20 DIAGNOSIS — C773 Secondary and unspecified malignant neoplasm of axilla and upper limb lymph nodes: Secondary | ICD-10-CM

## 2018-04-20 DIAGNOSIS — C801 Malignant (primary) neoplasm, unspecified: Secondary | ICD-10-CM

## 2018-04-20 DIAGNOSIS — R922 Inconclusive mammogram: Secondary | ICD-10-CM | POA: Diagnosis not present

## 2018-04-20 DIAGNOSIS — R9389 Abnormal findings on diagnostic imaging of other specified body structures: Secondary | ICD-10-CM

## 2018-04-20 NOTE — Progress Notes (Signed)
Attempted to call patient with chest xray results and to inform her that I need to order a CT chest to fully evaluate.    Wilber Bihari, NP

## 2018-04-26 ENCOUNTER — Telehealth: Payer: Self-pay

## 2018-04-26 ENCOUNTER — Other Ambulatory Visit: Payer: Self-pay | Admitting: General Surgery

## 2018-04-26 NOTE — Telephone Encounter (Signed)
Spoke with patient about getting a CT chest scheduled and pt in agreement.  Pt requested that central scheduling call her to make appt since her schedule is so busy.  Nurse made call to CS and made request per patient, CS to call and set up CT scan.

## 2018-05-03 ENCOUNTER — Other Ambulatory Visit (HOSPITAL_COMMUNITY): Payer: Self-pay

## 2018-05-03 ENCOUNTER — Ambulatory Visit (HOSPITAL_BASED_OUTPATIENT_CLINIC_OR_DEPARTMENT_OTHER)
Admission: RE | Admit: 2018-05-03 | Discharge: 2018-05-03 | Disposition: A | Discharge status: Home / Self Care | Payer: BLUE CROSS/BLUE SHIELD | Source: Ambulatory Visit | Attending: Cardiology | Admitting: Cardiology

## 2018-05-03 ENCOUNTER — Encounter (HOSPITAL_COMMUNITY): Payer: Self-pay | Admitting: Cardiology

## 2018-05-03 ENCOUNTER — Ambulatory Visit (HOSPITAL_COMMUNITY)
Admission: RE | Admit: 2018-05-03 | Discharge: 2018-05-03 | Disposition: A | Discharge status: Home / Self Care | Payer: BLUE CROSS/BLUE SHIELD | Source: Ambulatory Visit | Attending: Internal Medicine | Admitting: Internal Medicine

## 2018-05-03 ENCOUNTER — Other Ambulatory Visit: Payer: Self-pay

## 2018-05-03 VITALS — BP 137/73 | HR 100 | Wt 142.8 lb

## 2018-05-03 DIAGNOSIS — Z79899 Other long term (current) drug therapy: Secondary | ICD-10-CM | POA: Insufficient documentation

## 2018-05-03 DIAGNOSIS — I10 Essential (primary) hypertension: Secondary | ICD-10-CM | POA: Insufficient documentation

## 2018-05-03 DIAGNOSIS — C50612 Malignant neoplasm of axillary tail of left female breast: Secondary | ICD-10-CM

## 2018-05-03 DIAGNOSIS — Z170 Estrogen receptor positive status [ER+]: Secondary | ICD-10-CM | POA: Diagnosis not present

## 2018-05-03 DIAGNOSIS — K219 Gastro-esophageal reflux disease without esophagitis: Secondary | ICD-10-CM | POA: Diagnosis not present

## 2018-05-03 DIAGNOSIS — E785 Hyperlipidemia, unspecified: Secondary | ICD-10-CM | POA: Insufficient documentation

## 2018-05-03 NOTE — Patient Instructions (Signed)
Follow up as needed

## 2018-05-03 NOTE — Progress Notes (Signed)
Oncology: Dr. Baruch Merl Melissa [name] is a 57 y.o. female with history of breast cancer, HTN, hyperlipidemia was referred for cardio-oncology evaluation by Dr. Lindi Adie.  Axillary node met found initially in 5/18, ER+/PR+/HER2+.  Breast MRI did not show a primary tumor.  Plan for carboplatin/docetaxol/pertuzumab/trastuzumab x 6 cycles then trastuzumab to complete 1 year.  She has had left axillary node dissection in 11/18 and radition.   Seen in ED 01/30/18 for syncope. Felt to be vasovagal/dehydration in setting of GI illness. No recurrence.    She returns today for cardio-oncology followup.  Has been doing very well.  No dyspnea or chest pain.  No lightheadedness/dizziness.  She has 1 dose of Herceptin to go.   PMH: 1. Breast cancer: Axillary node met found initially in 5/18, ER+/PR+/HER2+.  Breast MRI did not show a primary tumor.  Plan for carboplatin/docetaxol/pertuzumab/trastuzumab x 6 cycles then trastuzumab to complete 1 year.  Lymph node dissection L axilla in 11/18.  - Echo (5/18): EF 55-60% - Echo (8/18): EF 60-65%, GLS -18.2%, RV normal size and systolic function.  - Echo (11/18): EF 60-65%, GLS -20.4%, normal RV size and systolic function.  - Echo (2/19): EF 60-65%, GLS -18.7, normal RV size and systolic function.  - Echo (5/19): EF 81-19%, normal diastolic function, GLS -14.7% 2. Hyperlipidemia 3. HTN 4. GERD  FH: Mitral valve prolapse.   Social History   Socioeconomic History  . Marital status: Married    Spouse name: Not on file  . Number of children: Not on file  . Years of education: Not on file  . Highest education level: Not on file  Occupational History  . Not on file  Social Needs  . Financial resource strain: Not on file  . Food insecurity:    Worry: Not on file    Inability: Not on file  . Transportation needs:    Medical: Not on file    Non-medical: Not on file  Tobacco Use  . Smoking status: Never Smoker  . Smokeless tobacco: Never Used  Substance and  Sexual Activity  . Alcohol use: Yes    Alcohol/week: 1.2 oz    Types: 2 Glasses of wine per week    Frequency: Never    Comment: socially  . Drug use: No  . Sexual activity: Not on file  Lifestyle  . Physical activity:    Days per week: Not on file    Minutes per session: Not on file  . Stress: Not on file  Relationships  . Social connections:    Talks on phone: Not on file    Gets together: Not on file    Attends religious service: Not on file    Active member of club or organization: Not on file    Attends meetings of clubs or organizations: Not on file    Relationship status: Not on file  Other Topics Concern  . Not on file  Social History Narrative  . Not on file   Review of systems complete and found to be negative unless listed in HPI.    Current Outpatient Medications  Medication Sig Dispense Refill  . ALPRAZolam (XANAX) 0.5 MG tablet Take 0.5 mg by mouth 3 (three) times daily as needed for anxiety.   0  . anastrozole (ARIMIDEX) 1 MG tablet Take 1 mg by mouth daily.    . calcium carbonate (OS-CAL) 1250 (500 Ca) MG chewable tablet Chew 1 tablet by mouth daily. Calcium 600 mg    . cholecalciferol (  VITAMIN D) 1000 units tablet Take 1,000 Units by mouth daily. 2000 iu    . levocetirizine (XYZAL) 5 MG tablet Take 5 mg by mouth every evening.    . lidocaine-prilocaine (EMLA) cream Apply to affected area once 30 g 3  . loperamide (IMODIUM) 2 MG capsule Take 4 mg by mouth as needed for diarrhea or loose stools.    . Magnesium Citrate 200 MG TABS Take by mouth.    . methocarbamol (ROBAXIN) 750 MG tablet Take 750 mg by mouth every 8 (eight) hours as needed for muscle spasms.    . rosuvastatin (CRESTOR) 10 MG tablet Take 10 mg by mouth daily.   0  . triamterene-hydrochlorothiazide (MAXZIDE-25) 37.5-25 MG tablet Take 1 tablet by mouth daily.    . Turmeric 500 MG TABS Take by mouth.    . zolpidem (AMBIEN) 5 MG tablet TAKE 1 TABLET BY MOUTH AT BEDTIME AS NEEDED FOR SLEEP 90 tablet  3   No current facility-administered medications for this encounter.    Vitals:   05/03/18 0957  BP: 137/73  Pulse: 100  SpO2: (!) 66%  Weight: 142 lb 12 oz (64.8 kg)   Wt Readings from Last 3 Encounters:  05/03/18 142 lb 12 oz (64.8 kg)  03/29/18 138 lb 3.2 oz (62.7 kg)  02/18/18 139 lb 3.2 oz (63.1 kg)    General: NAD Neck: No JVD, no thyromegaly or thyroid nodule.  Lungs: Clear to auscultation bilaterally with normal respiratory effort. CV: Nondisplaced PMI.  Heart regular S1/S2, no S3/S4, no murmur.  No peripheral edema.  No carotid bruit.  Normal pedal pulses.  Abdomen: Soft, nontender, no hepatosplenomegaly, no distention.  Skin: Intact without lesions or rashes.  Neurologic: Alert and oriented x 3.  Psych: Normal affect. Extremities: No clubbing or cyanosis.  HEENT: Normal.   1. Breast cancer: Patient has nearly completed Herceptin.  Echo was reviewed today and is stable.  She will not need further echoes.  2. HTN: BP controlled on current regimen.  3. Syncope: No recurrence, suspect vasovagal/dehydration.   Followup prn  [name], MD  05/03/2018

## 2018-05-03 NOTE — Progress Notes (Signed)
  Echocardiogram 2D Echocardiogram has been performed.  Melissa [name] 05/03/2018, 9:44 AM

## 2018-05-05 ENCOUNTER — Ambulatory Visit (HOSPITAL_COMMUNITY)
Admission: RE | Admit: 2018-05-05 | Discharge: 2018-05-05 | Disposition: A | Discharge status: Home / Self Care | Payer: BLUE CROSS/BLUE SHIELD | Source: Ambulatory Visit | Attending: Adult Health | Admitting: Adult Health

## 2018-05-05 DIAGNOSIS — Z5111 Encounter for antineoplastic chemotherapy: Secondary | ICD-10-CM | POA: Diagnosis not present

## 2018-05-05 DIAGNOSIS — I700 Atherosclerosis of aorta: Secondary | ICD-10-CM | POA: Diagnosis not present

## 2018-05-05 DIAGNOSIS — R9389 Abnormal findings on diagnostic imaging of other specified body structures: Secondary | ICD-10-CM | POA: Diagnosis not present

## 2018-05-05 DIAGNOSIS — Y842 Radiological procedure and radiotherapy as the cause of abnormal reaction of the patient, or of later complication, without mention of misadventure at the time of the procedure: Secondary | ICD-10-CM | POA: Insufficient documentation

## 2018-05-05 DIAGNOSIS — C50912 Malignant neoplasm of unspecified site of left female breast: Secondary | ICD-10-CM | POA: Diagnosis not present

## 2018-05-05 DIAGNOSIS — C801 Malignant (primary) neoplasm, unspecified: Secondary | ICD-10-CM

## 2018-05-05 MED ORDER — IOHEXOL 300 MG/ML  SOLN
75.0000 mL | Freq: Once | INTRAMUSCULAR | Status: AC | PRN
  Administered 2018-05-05: 75 mL via INTRAVENOUS

## 2018-05-09 ENCOUNTER — Telehealth: Payer: Self-pay

## 2018-05-09 NOTE — Telephone Encounter (Signed)
-----   Message from Gardenia Phlegm, NP sent at 05/09/2018 12:03 PM EDT ----- Im not sure if patient got the results that her ct scan was normal.  Thanks,  [place] ----- Message ----- From: Interface, Rad Results In Sent: 05/05/2018   7:49 PM To: Gardenia Phlegm, NP

## 2018-05-09 NOTE — Telephone Encounter (Signed)
Spoke with patient regarding normal CT results per LC.  Patient voiced understanding and has appt with [facility] tomorrow.

## 2018-05-10 ENCOUNTER — Telehealth: Payer: Self-pay | Admitting: Pharmacist

## 2018-05-10 ENCOUNTER — Inpatient Hospital Stay: Payer: BLUE CROSS/BLUE SHIELD

## 2018-05-10 ENCOUNTER — Telehealth: Payer: Self-pay

## 2018-05-10 ENCOUNTER — Telehealth: Payer: Self-pay | Admitting: Pharmacy Technician

## 2018-05-10 ENCOUNTER — Encounter: Payer: Self-pay | Admitting: *Deleted

## 2018-05-10 ENCOUNTER — Telehealth: Payer: Self-pay | Admitting: Hematology and Oncology

## 2018-05-10 ENCOUNTER — Inpatient Hospital Stay (HOSPITAL_BASED_OUTPATIENT_CLINIC_OR_DEPARTMENT_OTHER): Payer: BLUE CROSS/BLUE SHIELD | Admitting: Hematology and Oncology

## 2018-05-10 ENCOUNTER — Inpatient Hospital Stay: Payer: BLUE CROSS/BLUE SHIELD | Attending: Hematology and Oncology

## 2018-05-10 DIAGNOSIS — Z170 Estrogen receptor positive status [ER+]: Secondary | ICD-10-CM | POA: Diagnosis not present

## 2018-05-10 DIAGNOSIS — C773 Secondary and unspecified malignant neoplasm of axilla and upper limb lymph nodes: Secondary | ICD-10-CM | POA: Insufficient documentation

## 2018-05-10 DIAGNOSIS — Z79811 Long term (current) use of aromatase inhibitors: Secondary | ICD-10-CM | POA: Diagnosis not present

## 2018-05-10 DIAGNOSIS — Z5112 Encounter for antineoplastic immunotherapy: Secondary | ICD-10-CM | POA: Insufficient documentation

## 2018-05-10 DIAGNOSIS — C50612 Malignant neoplasm of axillary tail of left female breast: Secondary | ICD-10-CM

## 2018-05-10 DIAGNOSIS — C801 Malignant (primary) neoplasm, unspecified: Secondary | ICD-10-CM

## 2018-05-10 DIAGNOSIS — Z923 Personal history of irradiation: Secondary | ICD-10-CM | POA: Insufficient documentation

## 2018-05-10 DIAGNOSIS — R197 Diarrhea, unspecified: Secondary | ICD-10-CM | POA: Insufficient documentation

## 2018-05-10 DIAGNOSIS — Z79899 Other long term (current) drug therapy: Secondary | ICD-10-CM | POA: Insufficient documentation

## 2018-05-10 DIAGNOSIS — Z95828 Presence of other vascular implants and grafts: Secondary | ICD-10-CM

## 2018-05-10 LAB — CMP (CANCER CENTER ONLY)
ALBUMIN: 4.1 g/dL (ref 3.5–5.0)
ALT: 15 U/L (ref 0–55)
ANION GAP: 9 (ref 3–11)
AST: 19 U/L (ref 5–34)
Alkaline Phosphatase: 89 U/L (ref 40–150)
BUN: 21 mg/dL (ref 7–26)
CHLORIDE: 101 mmol/L (ref 98–109)
CO2: 28 mmol/L (ref 22–29)
CREATININE: 0.84 mg/dL (ref 0.60–1.10)
Calcium: 10 mg/dL (ref 8.4–10.4)
GFR, Estimated: >60 mL/min (ref >60)
GLUCOSE: 86 mg/dL (ref 70–140)
Potassium: 3.9 mmol/L (ref 3.5–5.1)
SODIUM: 138 mmol/L (ref 136–145)
Total Bilirubin: 0.2 mg/dL (ref 0.2–1.2)
Total Protein: 7.4 g/dL (ref 6.4–8.3)

## 2018-05-10 LAB — CBC WITH DIFFERENTIAL (CANCER CENTER ONLY)
Basophils Absolute: 0 10*3/uL (ref 0.0–0.1)
Basophils Relative: 0 %
Eosinophils Absolute: 0 10*3/uL (ref 0.0–0.5)
Eosinophils Relative: 1 %
HEMATOCRIT: 36.5 % (ref 34.8–46.6)
HEMOGLOBIN: 12.2 g/dL (ref 11.6–15.9)
LYMPHS ABS: 1.6 10*3/uL (ref 0.9–3.3)
Lymphocytes Relative: 35 %
MCH: 30.5 pg (ref 25.1–34.0)
MCHC: 33.4 g/dL (ref 31.5–36.0)
MCV: 91.3 fL (ref 79.5–101.0)
MONO ABS: 0.4 10*3/uL (ref 0.1–0.9)
Monocytes Relative: 8 %
NEUTROS ABS: 2.6 10*3/uL (ref 1.5–6.5)
NEUTROS PCT: 56 %
Platelet Count: 255 10*3/uL (ref 145–400)
RBC: 4 MIL/uL (ref 3.70–5.45)
RDW: 12.7 % (ref 11.2–14.5)
WBC Count: 4.6 10*3/uL (ref 3.9–10.3)

## 2018-05-10 MED ORDER — SODIUM CHLORIDE 0.9% FLUSH
10.0000 mL | INTRAVENOUS | Status: DC | PRN
  Administered 2018-05-10: 10 mL via INTRAVENOUS
  Filled 2018-05-10: qty 10

## 2018-05-10 MED ORDER — ACETAMINOPHEN 325 MG PO TABS
650.0000 mg | ORAL_TABLET | Freq: Once | ORAL | Status: AC
  Administered 2018-05-10: 650 mg via ORAL

## 2018-05-10 MED ORDER — SODIUM CHLORIDE 0.9% FLUSH
10.0000 mL | INTRAVENOUS | Status: DC | PRN
  Administered 2018-05-10: 10 mL
  Filled 2018-05-10: qty 10

## 2018-05-10 MED ORDER — BUDESONIDE ER 9 MG PO TB24: 9.0000 mg | ORAL_TABLET | Freq: Every day | ORAL | 2 refills | Status: DC

## 2018-05-10 MED ORDER — DIPHENHYDRAMINE HCL 25 MG PO CAPS
ORAL_CAPSULE | ORAL | Status: AC
  Filled 2018-05-10: qty 2

## 2018-05-10 MED ORDER — ACETAMINOPHEN 325 MG PO TABS
ORAL_TABLET | ORAL | Status: AC
Start: 2018-05-10 — End: ?
  Filled 2018-05-10: qty 2

## 2018-05-10 MED ORDER — TRASTUZUMAB CHEMO 150 MG IV SOLR
6.0000 mg/kg | Freq: Once | INTRAVENOUS | Status: AC
  Administered 2018-05-10: 378 mg via INTRAVENOUS
  Filled 2018-05-10: qty 18

## 2018-05-10 MED ORDER — LOPERAMIDE HCL 2 MG PO CAPS: 4.0000 mg | ORAL_CAPSULE | Freq: Three times a day (TID) | ORAL | 2 refills | Status: DC | PRN

## 2018-05-10 MED ORDER — SODIUM CHLORIDE 0.9 % IV SOLN
420.0000 mg | Freq: Once | INTRAVENOUS | Status: AC
  Administered 2018-05-10: 420 mg via INTRAVENOUS
  Filled 2018-05-10: qty 14

## 2018-05-10 MED ORDER — DIPHENHYDRAMINE HCL 25 MG PO CAPS
50.0000 mg | ORAL_CAPSULE | Freq: Once | ORAL | Status: AC
  Administered 2018-05-10: 50 mg via ORAL

## 2018-05-10 MED ORDER — NERATINIB MALEATE 40 MG PO TABS: 240.0000 mg | ORAL_TABLET | Freq: Every day | ORAL | 3 refills | Status: DC

## 2018-05-10 MED ORDER — SODIUM CHLORIDE 0.9 % IV SOLN
Freq: Once | INTRAVENOUS | Status: AC
  Administered 2018-05-10: 13:00:00 via INTRAVENOUS

## 2018-05-10 MED ORDER — HEPARIN SOD (PORK) LOCK FLUSH 100 UNIT/ML IV SOLN
500.0000 [IU] | Freq: Once | INTRAVENOUS | Status: AC | PRN
  Administered 2018-05-10: 500 [IU]
  Filled 2018-05-10: qty 5

## 2018-05-10 NOTE — Telephone Encounter (Signed)
Oral Chemotherapy Pharmacist Encounter   I spoke with patient and daughter in exam room for overview of: [name].  Last Herceptin and Perjeta: 05/10/18  Counseled patient on administration, dosing, side effects, monitoring, drug-food interactions, safe handling, storage, and disposal.  Prescribing information for [name] recommends dose initiation at full dose, 6 tablets (240 mg) once daily, with the use of scheduled loperamide antidiarrheal prophylaxis for the first 8 weeks of therapy.  [name] will be initiated on a dose titration schedule. Antidiarrheal prophylaxis will not be used.   Patient will use loperamide as needed if symptoms of diarrhea occur.   Weeks 1 and 2: Patient will take [name] 40mg  tablets, 4 tablets (160mg ) by mouth once daily with food  Patient will be seen in the office for symptom management check 2 weeks after starting [name]. Further dose titration to target dose will occur based on physician and patient discretion.  Patient knows to avoid grapefruit and grapefruit juice while on treatment with [name].  Patient instructed to avoid use of PPIs or H2RAs while on treatment with [name], can separate antacids from [name] by 3 hours if acid suppression is needed.  [name] start date: 05/31/18  Adverse effects include but are not limited to: diarrhea, nausea, vomiting, mouth sores, fatigue, rash, and abdominal pain.    Prescriptions for loperamide and budesonide for anti-diarrheal prophylaxis sent to patient's preferred local pharmacy. Patient provided with manufacturer voucher for 3 months of anti-diarrheal medications at $0 out of pocket cost to patient to take to pharmacy.  Patient instructed to increase or decrease loperamide dose to maintain 1-2 bowel movements per day. She will take budesonide daily.  Reviewed with patient importance of keeping a medication schedule and plan for any missed doses.  Mrs. Ipock voiced understanding and appreciation.   All  questions answered. Medication reconciliation performed and medication/allergy list updated.  Patient provided phone number to [facility] (ph: [phone]) as this will be dispensing pharmacy for [name] once insurance authorization is obtained.   Patient knows to call the office with questions or concerns. Oral Oncology Clinic will continue to follow.  Thank you,  Johny Drilling, PharmD, BCPS, BCOP  05/10/2018   3:11 PM Oral Oncology Clinic [phone]

## 2018-05-10 NOTE — Telephone Encounter (Signed)
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  Last Herceptin and Perjeta: 05/10/18  Labs from 05/10/18 assessed, OK for treatment.  Current medication list in Epic reviewed, DDI with [name] and calcium carbonate identified:  Antacids may decrease serum concentration of [name] due to impaired absorption. Patient will be counseled to separate calcium supplement from [name] by 3 hours.  Prescription will be sent to appropriate specialty pharmacy once insurance authorization is obtained.  Oral Oncology Clinic will continue to follow for insurance authorization, copayment issues, initial counseling and start date.  Jesse Mack, PharmD, BCPS, BCOP  05/10/2018 12:27 PM Oral Oncology Clinic [phone]  

## 2018-05-10 NOTE — Telephone Encounter (Signed)
Oral Oncology Patient Advocate Encounter  Received notification from [facility] that prior authorization for [name] is required.  PA submitted on CoverMyMeds Key [id number] Status is pending  Oral Oncology Clinic will continue to follow.   Fabio Asa. Melynda Keller, [place] Patient [place] [phone] 05/10/2018 2:27 PM

## 2018-05-10 NOTE — Assessment & Plan Note (Signed)
04/14/2017 Left axillary lymph node biopsy: Metastatic carcinoma positive for CK 7, ER positive,GATA-3 equivocal, negative for CK 20,GCDFP, CDX-2, TTF-1; HER-2 positive ratio 2.08 mammogram and ultrasound revealed 3 discrete abnormal left axillary lymph nodes largest measures 1.8 cm Clinical stage: TX N1MX  Treatment plan: 1. Neoadjuvant TCH Perjeta 6 cycles completed 08/31/2017 followed by Herceptin, Perjeta maintenance for 1 year 2. followed by axillary lymph node dissection. Breast surgery will not be performed because there is no primary tumor identifiable on breast MRI: 10/14/17: 0/13 LN Negative 3. Followed by radiation 12/08/2017-01/12/2018 4. Followed by antiestrogen therapy started 01/30/2018 ------------------------------------------------------------------------------------------------------------------ Current Treatment:Herceptin Perjeta maintenance therapy 2 days a last treatment/Anastrozole daily Toxicities: No side effects to Herceptin and Perjeta  Neratinib counseling: Neratinib is up and HER-2 inhibitor that is currently approved for treatment of HER-2 positive breast cancer after conclusion of one year of Herceptin treatment. Based on the studies it does appear to have a progression free survival difference of 2% absolute value. The starting dose of the spill is 200 mg once daily. The greatest side effect of this medication is diarrhea. Patient will need to be on prophylactic antidiarrheal medications. Days 1 to 14: Loperamide 4 mg orally 3 times daily Days 15 to 56: Loperamide 4 mg orally twice daily Days 57 to 365: Loperamide 4 mg as needed (maximum: 16 mg/day) During the treatment will have to monitor the liver functions. Apart from the diarrhea other side effects include fatigue 27%, skin rash 18%, muscle spasms in 11%.  Return to clinic in 3 weeks for toxicity check on Neratinib. Oral chemotherapy pharmacist Denyse Amass spent time discussing about the pros and cons of  Neratinib.

## 2018-05-10 NOTE — Telephone Encounter (Signed)
LVM for patient regarding added lab appt. On 07/09. Cyndia Bent RN

## 2018-05-10 NOTE — Progress Notes (Signed)
Patient Care Team: Deland Pretty, MD as PCP - General (Internal Medicine)  DIAGNOSIS:  Encounter Diagnosis  Name Primary?  . 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[place])   04/14/2017 Initial Diagnosis    Left axillary lymph node biopsy: Metastatic carcinoma positive for CK 7, ER positive,GATA-3 equivocal, negative for CK 20,GCDFP, CDX-2, TTF-1; HER-2 positive ratio 2.08; mammogram and ultrasound revealed 3 discrete abnormal left axillary lymph nodes largest measures 1.8 cm      04/23/2017 Breast MRI    Left axillary lymphadenopathy numerous enlarged level I axillary lymph nodes largest 2.1 cm, no other abdominal masses in the left breast itself, no MRI evidence of malignancy in the right breast      04/26/2017 Imaging    CT CAP: Prominent left axillary lymph nodes no distant metastases, sclerotic focus left pedicle of T9 vertebra favored benign, T5 vertebral hemangioma      05/12/2017 - 08/31/2017 Neo-Adjuvant Chemotherapy    TCH Perjeta x6 followed by Herceptin Perjeta maintenance for 1 year      10/14/2017 Surgery    Left axillary lymph node dissection: 0/13 lymph nodes complete pathologic response.  Breast was not operated on because no breast primary was ever identified      12/09/2017 - 01/11/2018 Radiation Therapy    Adjuvant radiation therapy      01/30/2018 -  Anti-estrogen oral therapy    Anastrozole 1 mg daily       CHIEF COMPLIANT: Last cycle of Herceptin and Perjeta  INTERVAL HISTORY: Melissa [name] is a 57 year old with above-mentioned history of left breast cancer who underwent neoadjuvant chemotherapy followed by surgery and radiation and is currently on anastrozole along with Herceptin and Perjeta.  Today the last dosage of Herceptin and Perjeta.  Apart from mild diarrhea she is appears to be  tolerating it extremely well.  She is also tolerating anastrozole fairly well.  She does have intermittent muscle aches and stiffness.  REVIEW OF SYSTEMS:   Constitutional: Denies fevers, chills or abnormal weight loss Eyes: Denies blurriness of vision Ears, nose, mouth, throat, and face: Denies mucositis or sore throat Respiratory: Denies cough, dyspnea or wheezes Cardiovascular: Denies palpitation, chest discomfort Gastrointestinal:  Denies nausea, heartburn or change in bowel habits Skin: Denies abnormal skin rashes Lymphatics: Denies new lymphadenopathy or easy bruising Neurological:Denies numbness, tingling or new weaknesses Behavioral/Psych: Mood is stable, no new changes  Extremities: No lower extremity edema Breast:  denies any pain or lumps or nodules in either breasts All other systems were reviewed with the patient and are negative.  I have reviewed the past medical history, past surgical history, social history and family history with the patient and they are unchanged from previous note.  ALLERGIES:  has No Known Allergies.  MEDICATIONS:  Current Outpatient Medications  Medication Sig Dispense Refill  . ALPRAZolam (XANAX) 0.5 MG tablet Take 0.5 mg by mouth 3 (three) times daily as needed for anxiety.   0  . anastrozole (ARIMIDEX) 1 MG tablet Take 1 mg by mouth daily.    . calcium carbonate (OS-CAL) 1250 (500 Ca) MG chewable tablet Chew 1 tablet by mouth daily. Calcium 600 mg    . cholecalciferol (VITAMIN D) 1000 units tablet Take 1,000 Units by mouth daily. 2000 iu    . levocetirizine (XYZAL) 5 MG tablet Take 5 mg by  mouth every evening.    . lidocaine-prilocaine (EMLA) cream Apply to affected area once 30 g 3  . loperamide (IMODIUM) 2 MG capsule Take 4 mg by mouth as needed for diarrhea or loose stools.    . Magnesium Citrate 200 MG TABS Take by mouth.    . Neratinib Maleate ([name]) 40 MG tablet Take 6 tablets (240 mg total) by mouth daily. Take with food. 180 tablet 3   . rosuvastatin (CRESTOR) 10 MG tablet Take 10 mg by mouth daily.   0  . triamterene-hydrochlorothiazide (MAXZIDE-25) 37.5-25 MG tablet Take 1 tablet by mouth daily.    . Turmeric 500 MG TABS Take by mouth.    . zolpidem (AMBIEN) 5 MG tablet TAKE 1 TABLET BY MOUTH AT BEDTIME AS NEEDED FOR SLEEP 90 tablet 3   No current facility-administered medications for this visit.     PHYSICAL EXAMINATION: ECOG PERFORMANCE STATUS: 1 - Symptomatic but completely ambulatory  Vitals:   05/10/18 1120  BP: (!) 151/86  Pulse: 75  Resp: 18  Temp: 98 F (36.7 C)  SpO2: 100%   Filed Weights   05/10/18 1120  Weight: 142 lb 12.8 oz (64.8 kg)    GENERAL:alert, no distress and comfortable SKIN: skin color, texture, turgor are normal, no rashes or significant lesions EYES: normal, Conjunctiva are pink and non-injected, sclera clear OROPHARYNX:no exudate, no erythema and lips, buccal mucosa, and tongue normal  NECK: supple, thyroid normal size, non-tender, without nodularity LYMPH:  no palpable lymphadenopathy in the cervical, axillary or inguinal LUNGS: clear to auscultation and percussion with normal breathing effort HEART: regular rate & rhythm and no murmurs and no lower extremity edema ABDOMEN:abdomen soft, non-tender and normal bowel sounds MUSCULOSKELETAL:no cyanosis of digits and no clubbing  NEURO: alert & oriented x 3 with fluent speech, no focal motor/sensory deficits EXTREMITIES: No lower extremity edema  LABORATORY DATA:  I have reviewed the data as listed CMP Latest Ref Rng & Units 05/10/2018 04/19/2018 03/29/2018  Glucose 70 - 140 mg/dL 86 108 80  BUN 7 - 26 mg/dL 21 19 22   Creatinine 0.60 - 1.10 mg/dL 0.84 0.86 0.90  Sodium 136 - 145 mmol/L 138 140 138  Potassium 3.5 - 5.1 mmol/L 3.9 3.3(L) 4.0  Chloride 98 - 109 mmol/L 101 101 102  CO2 22 - 29 mmol/L 28 30(H) 26  Calcium 8.4 - 10.4 mg/dL 10.0 10.0 9.9  Total Protein 6.4 - 8.3 g/dL 7.4 7.0 7.1  Total Bilirubin 0.2 - 1.2 mg/dL 0.2  0.3 0.3  Alkaline Phos 40 - 150 U/L 89 80 89  AST 5 - 34 U/L 19 19 22   ALT 0 - 55 U/L 15 19 15     Lab Results  Component Value Date   WBC 4.6 05/10/2018   HGB 12.2 05/10/2018   HCT 36.5 05/10/2018   MCV 91.3 05/10/2018   PLT 255 05/10/2018   NEUTROABS 2.6 05/10/2018    ASSESSMENT & PLAN:  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 Neoadjuvant TCH Perjeta 6 cycles completed 08/31/2017 followed by Herceptin, Perjeta maintenance for 1 year 2. followed by axillary lymph node dissection. Breast surgery will not be performed because there is no primary  tumor identifiable on breast MRI: 10/14/17: 0/13 LN Negative 3. Followed by radiation 12/08/2017-01/12/2018 4. Followed by antiestrogen therapy started 01/30/2018 ------------------------------------------------------------------------------------------------------------------ Current Treatment:Herceptin Perjeta maintenance therapy 2 days a last treatment/Anastrozole daily Toxicities: No side effects to Herceptin and Perjeta  Neratinib counseling: Neratinib is up and HER-2 inhibitor that is currently approved for treatment of HER-2 positive breast cancer after conclusion of one year of Herceptin treatment. Based on the studies it does appear to have a progression free survival difference of 2% absolute value. The starting dose of the spill is 200 mg once daily. The greatest side effect of this medication is diarrhea. Patient will need to be on prophylactic antidiarrheal medications. Days 1 to 14: Loperamide 4 mg orally 3 times daily Days 15 to 56: Loperamide 4 mg orally twice  daily Days 57 to 365: Loperamide 4 mg as needed (maximum: 16 mg/day) During the treatment will have to monitor the liver functions. Apart from the diarrhea other side effects include fatigue 27%, skin rash 18%, muscle spasms in 11%.  Return to clinic in 3 weeks for toxicity check on Neratinib. Oral chemotherapy pharmacist Denyse Amass spent time discussing about the pros and cons of Neratinib.      No orders of the defined types were placed in this encounter.  The patient has a good understanding of the overall plan. she agrees with it. she will call with any problems that may develop before the next visit here.   Harriette Ohara, MD 05/10/18

## 2018-05-10 NOTE — Patient Instructions (Signed)
Cancer Center Discharge Instructions for Patients Receiving Chemotherapy  Today you received the following chemotherapy agents Herceptin and Perjeta.   To help prevent nausea and vomiting after your treatment, we encourage you to take your nausea medication as directed.   If you develop nausea and vomiting that is not controlled by your nausea medication, call the clinic.   BELOW ARE SYMPTOMS THAT SHOULD BE REPORTED IMMEDIATELY:  *FEVER GREATER THAN 100.5 F  *CHILLS WITH OR WITHOUT FEVER  NAUSEA AND VOMITING THAT IS NOT CONTROLLED WITH YOUR NAUSEA MEDICATION  *UNUSUAL SHORTNESS OF BREATH  *UNUSUAL BRUISING OR BLEEDING  TENDERNESS IN MOUTH AND THROAT WITH OR WITHOUT PRESENCE OF ULCERS  *URINARY PROBLEMS  *BOWEL PROBLEMS  UNUSUAL RASH Items with * indicate a potential emergency and should be followed up as soon as possible.  Feel free to call the clinic should you have any questions or concerns. The clinic phone number is [phone].  Please show the CHEMO ALERT CARD at check-in to the Emergency Department and triage nurse.   

## 2018-05-10 NOTE — Telephone Encounter (Signed)
Gave patient AVs and calendar of upcoming June appointments. °

## 2018-05-13 MED ORDER — NERATINIB MALEATE 40 MG PO TABS: 240.0000 mg | ORAL_TABLET | Freq: Every day | ORAL | 3 refills | Status: DC

## 2018-05-13 NOTE — Telephone Encounter (Signed)
Oral Oncology Pharmacist Encounter  Received notification that [name] prior authorization is approved.  Prescription for [name] e-scribed to [name] is a limited distribution medication. Supporting information has been faxed to pharmacy to assist in account set-up.  Patient has already been provided contact information of [facility] (ph: [phone]) with instructions to contact dispensing pharmacy week of 6/10 if she has not heard from them to schedule shipment of [name].  [name] start date: 05/31/18  Oral Oncology Clinic will continue to follow.  Johny Drilling, PharmD, BCPS, BCOP  05/13/2018 8:45 AM Oral Oncology Clinic [phone]

## 2018-05-13 NOTE — Telephone Encounter (Signed)
Oral Oncology Patient Advocate Encounter  Prior Authorization for [name] has been approved.    PA# [id number] Effective dates: 05/10/2018 through 05/09/2019  Oral Oncology Clinic will continue to follow.   Fabio Asa. Melynda Keller, [place] Patient [place] [phone] 05/13/2018 8:19 AM

## 2018-05-16 ENCOUNTER — Ambulatory Visit (HOSPITAL_BASED_OUTPATIENT_CLINIC_OR_DEPARTMENT_OTHER): Admit: 2018-05-16 | Payer: BLUE CROSS/BLUE SHIELD | Admitting: General Surgery

## 2018-05-16 ENCOUNTER — Encounter (HOSPITAL_BASED_OUTPATIENT_CLINIC_OR_DEPARTMENT_OTHER): Payer: Self-pay

## 2018-05-16 DIAGNOSIS — Z853 Personal history of malignant neoplasm of breast: Secondary | ICD-10-CM | POA: Diagnosis not present

## 2018-05-16 DIAGNOSIS — Z452 Encounter for adjustment and management of vascular access device: Secondary | ICD-10-CM | POA: Diagnosis not present

## 2018-05-16 SURGERY — REMOVAL PORT-A-CATH
Anesthesia: Monitor Anesthesia Care

## 2018-05-19 NOTE — Telephone Encounter (Signed)
Oral Oncology Patient Advocate Encounter  Received communication from [facility] that the patient's initial shipment of [name] will arrive at her home on 05/24/2018 for a copayment of $0.00.   Fabio Asa. Melynda Keller, [place] Patient [place] [phone] 05/19/2018 12:07 PM

## 2018-05-20 ENCOUNTER — Telehealth: Payer: Self-pay | Admitting: *Deleted

## 2018-05-20 NOTE — Telephone Encounter (Signed)
Received call from patient stating they had a family friend pass away and they are going to [place] at the end of June and patient is going to wait until after she gets back to start [name].  She also needed to change her appointment with Dr. Lindi Adie.  New appointment confirmed for 06/20/18 at 9:45am for lab and 1015 with Dr. Lindi Adie.

## 2018-06-01 ENCOUNTER — Other Ambulatory Visit: Payer: Self-pay

## 2018-06-01 ENCOUNTER — Ambulatory Visit: Payer: BLUE CROSS/BLUE SHIELD | Attending: General Surgery | Admitting: Physical Therapy

## 2018-06-01 DIAGNOSIS — M25512 Pain in left shoulder: Secondary | ICD-10-CM | POA: Insufficient documentation

## 2018-06-01 DIAGNOSIS — L599 Disorder of the skin and subcutaneous tissue related to radiation, unspecified: Secondary | ICD-10-CM | POA: Diagnosis not present

## 2018-06-01 DIAGNOSIS — M25612 Stiffness of left shoulder, not elsewhere classified: Secondary | ICD-10-CM

## 2018-06-01 NOTE — Therapy (Signed)
[place], [place], [address] Phone: [phone]   Fax:  [phone]  Physical Therapy Evaluation  Patient Details  Name: Melissa [name] MRN: [id number] Date of Birth: 09-27-1961 Referring Provider: Dr. Rolm Bookbinder   Encounter Date: 06/01/2018  PT End of Session - 06/01/18 1358    Visit Number  1    Number of Visits  9    Date for PT Re-Evaluation  07/06/18    PT Start Time  9373 pt. late for appt.    PT Stop Time  1348    PT Time Calculation (min)  40 min    Activity Tolerance  Patient tolerated treatment well    Behavior During Therapy  WFL for tasks assessed/performed       Past Medical History:  Diagnosis Date  . Breast cancer ([place]) 04/2017   left breast  . Cervical cancer ([place])    1990, treated with hysterectomy only  . GERD (gastroesophageal reflux disease)   . History of radiation therapy 12/09/17- 01/12/18   Left Breast treated to 50 Gy with 25 fx of 2 Gy  . Hypercholesteremia   . Hypertension     Past Surgical History:  Procedure Laterality Date  . ABDOMINAL HYSTERECTOMY  1990  . AXILLARY LYMPH NODE DISSECTION Left 10/14/2017   Procedure: LEFT AXILLARY LYMPH NODE DISSECTION;  Surgeon: Rolm Bookbinder, MD;  Location: [facility];  Service: General;  Laterality: Left;  . EYE SURGERY     eye lift  . PORTACATH PLACEMENT Right 05/12/2017   Procedure: INSERTION PORT-A-CATH WITH ULTRASOUND;  Surgeon: Rolm Bookbinder, MD;  Location: [facility];  Service: General;  Laterality: Right;    There were no vitals filed for this visit.   Subjective Assessment - 06/01/18 1310    Subjective  "I didn't equate my decline in motion with radiation, but it's really not been good since finishing radiation in February." Has had tingling/numbness develop in left arm (fingers); also has had it in her feet from the chemotherapy--numbness in toes is getting better.    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 No tumor was found in breast. Finished chemo 08/11/17 with maintenance through May 10, 2018. Radiation completed 01/11/18. On Arimidex and starting another chemo pill. Had HTN but is weaning off low dose of meds now. Hyperlipidemia.     Patient Stated Goals  Improve shoulder ROM by quite a bit.    Currently in Pain?  Yes    Pain Score  0-No pain up to 8/10    Pain Location  Other (Comment) left upper quadrant    Pain Orientation  Left    Pain Descriptors / Indicators  Sore;Tender    Aggravating Factors   worse later in the day    Pain Relieving Factors  Tylenol and going to bed; better early in the day         [facility] PT Assessment - 06/01/18 0001      Assessment   Medical Diagnosis  left breast cancer with axillary mets; no tumor found in breast    Referring Provider  Dr. Rolm Bookbinder    Onset Date/Surgical Date  10/14/17    Hand Dominance  Right    Prior Therapy  none this year; was here last December      Precautions   Precautions  Other (comment)    Precaution Comments  cancer precautions  Restrictions   Weight Bearing Restrictions  No      Balance Screen   Has the patient fallen in the past 6 months  No    Has the patient had a decrease in activity level because of a fear of falling?   No    Is the patient reluctant to leave their home because of a fear of falling?   No      Home Film/video editor residence    Living Arrangements  Spouse/significant other    Type of [place]  Two level      Prior Function   Level of Independence  Independent    Leisure  walking 3x/week a mile (20-25 mins.); would like to start yoga      Cognition   Overall Cognitive Status  Within Functional Limits for tasks assessed      Observation/Other Assessments   Skin Integrity  no evidence of radiation effects on her skin; lymph node  dissection scar is well-healed      Posture/Postural Control   Posture/Postural Control  No significant limitations slight forward head      ROM / Strength   AROM / PROM / Strength  AROM;PROM      AROM   AROM Assessment Site  Shoulder    Right/Left Shoulder  Right;Left    Right Shoulder Flexion  158 Degrees    Right Shoulder ABduction  185 Degrees    Right Shoulder Internal Rotation  -- WFL    Right Shoulder External Rotation  90 Degrees    Left Shoulder Flexion  113 Degrees reports pulling "like a tendon" and there's pain    Left Shoulder ABduction  95 Degrees pain in upper arm to forearm    Left Shoulder Internal Rotation  -- [facility]    Left Shoulder External Rotation  60 Degrees      PROM   PROM Assessment Site  Shoulder    Right/Left Shoulder  Left    Left Shoulder Flexion  125 Degrees painful    Left Shoulder ABduction  100 Degrees palpable cording in left axilla      Palpation   Palpation comment  Cording not visible but palpable in left UE        LYMPHEDEMA/ONCOLOGY QUESTIONNAIRE - 06/01/18 1328      Lymphedema Assessments   Lymphedema Assessments  Upper extremities      Left Upper Extremity Lymphedema   10 cm Proximal to Olecranon Process  25.7 cm    Olecranon Process  23.9 cm    10 cm Proximal to Ulnar Styloid Process  20 cm    Just Proximal to Ulnar Styloid Process  14.8 cm    Across Hand at [employer]  17.6 cm    At [place] of 2nd Digit  5.7 cm    Other  has compression sleeves and gloves             Objective measurements completed on examination: See above findings.              PT Education - 06/01/18 1358    Education Details  about axillary web syndrome    Person(s) Educated  Patient    Methods  Explanation;Handout    Comprehension  Verbalized understanding          PT Long Term Goals - 06/01/18 1406      PT LONG TERM GOAL #1   Time  4    Period  Weeks    Status  New      PT LONG TERM GOAL #2   Title  Pt. will show at  least 145 degrees of left shoulder abduction to enable radiation positoning easily.    Time  4    Period  Weeks    Status  New      PT LONG TERM GOAL #3   Title  Pt. will show good understanding of lymphedema risk reduction practices.    Time  4    Period  Weeks    Status  New      PT LONG TERM GOAL #4   Title  Patient will report at least 75% decrease in discomfort overall in left UE.    Time  4    Period  Weeks    Status  New             Plan - 06/01/18 1359    Clinical Impression Statement  Pt. who had breast cancer diagnosed from an axillary lymph node with no breast tumor found. Treated with neo-adjuvant chemotherapy, ALND and radiation as well as Arimidex and a newer chemo pill. She has had decreasing left shoulder ROM and left arm and upper quadrant pain since completing radiation. She describes a sensation in left UE with movement of that arm that sounds like cording; cording is not visible but slightly palpable today and there is some dimpling seen as we often see with cording. Her left shoulder ROM is limited. Her left UE circumferences are minimally changed from measurements taken last December. She does have compression sleeves and gloves and has not been wearing them, but therapist suggested today that she wear them for the time being. She reports some newer tingling in left UE to fingers, possibly from radiation?    Clinical Decision Making  Low    Rehab Potential  Excellent    PT Frequency  2x / week    PT Duration  4 weeks    PT Treatment/Interventions  ADLs/Self Care Home Management;Moist Heat;DME Instruction;Therapeutic exercise;Patient/family education;Orthotic Fit/Training;Manual techniques;Manual lymph drainage;Scar mobilization;Passive range of motion;Taping    PT Next Visit Plan  Begin left shoulder P/AA/A/ROM with stretching and mobilization of cording in left UE; teach HEP for same.    PT Home Exercise Plan  suggested patient use compression garments daily  for now    Consulted and Agree with Plan of Care  Patient       Patient will benefit from skilled therapeutic intervention in order to improve the following deficits and impairments:  Decreased range of motion, Pain, Impaired UE functional use  Visit Diagnosis: Stiffness of left shoulder, not elsewhere classified - Plan: PT plan of care cert/re-cert  Left shoulder pain, unspecified chronicity - Plan: PT plan of care cert/re-cert  Disorder of the skin and subcutaneous tissue related to radiation, unspecified - Plan: PT plan of care cert/re-cert     Problem List Patient Active Problem List   Diagnosis Date Noted  . 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 Breast cancer, left ([place]) 10/14/2017  . Port catheter in place 07/20/2017  . 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 Malignant (primary) neoplasm, unspecified ([place]) 04/26/2017  . 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[place]) 04/21/2017    Peace Jost 06/01/2018, 2:12 PM  [facility]  [place], [place], [address] Phone: [phone]   Fax:  [phone]  Name: Melissa [name] MRN: [id number] Date of Birth: December 22, 1960  Serafina Royals, PT 06/01/18 2:12 PM

## 2018-06-02 ENCOUNTER — Ambulatory Visit: Payer: BLUE CROSS/BLUE SHIELD | Admitting: Hematology and Oncology

## 2018-06-08 ENCOUNTER — Ambulatory Visit: Payer: BLUE CROSS/BLUE SHIELD | Attending: General Surgery

## 2018-06-08 ENCOUNTER — Telehealth: Payer: Self-pay | Admitting: Pharmacist

## 2018-06-08 DIAGNOSIS — M25612 Stiffness of left shoulder, not elsewhere classified: Secondary | ICD-10-CM | POA: Insufficient documentation

## 2018-06-08 DIAGNOSIS — L599 Disorder of the skin and subcutaneous tissue related to radiation, unspecified: Secondary | ICD-10-CM | POA: Insufficient documentation

## 2018-06-08 DIAGNOSIS — M25512 Pain in left shoulder: Secondary | ICD-10-CM | POA: Insufficient documentation

## 2018-06-08 NOTE — Telephone Encounter (Signed)
Oral Oncology Pharmacist Encounter  Received call from patient with questions about side effect management after recent start of [name] (neratinib). [name] start date: 06/06/2018  Patient states she has experienced 2 episodes of loose stools this morning. Today is the 1st day with side effects.  Patient states she started antidiarrheal prophylaxis on the same day as [name] initiation with loperamide 4 mg, taken 3 times daily and budesonide 9 mg, taken once daily. Patient initiated [name] at 4 tablets (160 mg) by mouth once daily, taken with food.  Patient instructed to treat each episode of loose stools with loperamide 2 mg, reactively. Patient instructed to increase tomorrow's scheduled doses of loperamide based on breakthrough usage today. Patient instructed to continue to titrate dose of loperamide to 1-2 bowel movements each day.  Patient will alert the office if increase in loperamide use does not manage symptoms of diarrhea. Patient will require additional antidiarrheal medication if uncontrolled by loperamide and budesonide.  Patient understands and is in agreement with above plan. She knows to call the office with any additional questions or concerns. Oral oncology clinic will continue to follow.  Johny Drilling, PharmD, BCPS, BCOP  06/08/2018 12:00 PM Oral Oncology Clinic [phone]

## 2018-06-08 NOTE — Patient Instructions (Signed)

## 2018-06-08 NOTE — Therapy (Signed)
[place], [place], [address] Phone: [phone]   Fax:  [phone]  Physical Therapy Treatment  Patient Details  Name: Melissa [name] MRN: [id number] Date of Birth: 11/29/1961 Referring Provider: Dr. Rolm Bookbinder   Encounter Date: 06/08/2018  PT End of Session - 06/08/18 1607    Visit Number  2    Number of Visits  9    Date for PT Re-Evaluation  07/06/18    PT Start Time  1601    PT Stop Time  1606    PT Time Calculation (min)  45 min    Activity Tolerance  Patient tolerated treatment well    Behavior During Therapy  [facility] for tasks assessed/performed       Past Medical History:  Diagnosis Date  . Breast cancer ([place]) 04/2017   left breast  . Cervical cancer ([place])    1990, treated with hysterectomy only  . GERD (gastroesophageal reflux disease)   . History of radiation therapy 12/09/17- 01/12/18   Left Breast treated to 50 Gy with 25 fx of 2 Gy  . Hypercholesteremia   . Hypertension     Past Surgical History:  Procedure Laterality Date  . ABDOMINAL HYSTERECTOMY  1990  . AXILLARY LYMPH NODE DISSECTION Left 10/14/2017   Procedure: LEFT AXILLARY LYMPH NODE DISSECTION;  Surgeon: Rolm Bookbinder, MD;  Location: [facility];  Service: General;  Laterality: Left;  . EYE SURGERY     eye lift  . PORTACATH PLACEMENT Right 05/12/2017   Procedure: INSERTION PORT-A-CATH WITH ULTRASOUND;  Surgeon: Rolm Bookbinder, MD;  Location: [name];  Service: General;  Laterality: Right;    There were no vitals filed for this visit.  Subjective Assessment - 06/08/18 1534    Subjective  I really want to try Yoga but not sure how that's going to go.     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 No tumor was found in breast. Finished chemo 08/11/17 with maintenance through May 10, 2018. Radiation completed 01/11/18. On Arimidex  and starting another chemo pill. Had HTN but is weaning off low dose of meds now. Hyperlipidemia.     Patient Stated Goals  Improve shoulder ROM by quite a bit.    Currently in Pain?  No/denies                       [facility] Adult PT Treatment/Exercise - 06/08/18 0001      Shoulder Exercises: Pulleys   Flexion  2 minutes    Flexion Limitations  Pt returned therapist demonstration    ABduction  2 minutes    ABduction Limitations  Pt returned therapist demonstration      Shoulder Exercises: Therapy Ball   Flexion  Both;10 reps Forward lean into end of stretch    ABduction  Left;10 reps Same side lean into end of stretch      Shoulder Exercises: Stretch   Corner Stretch  3 reps;10 seconds In doorway    Other Shoulder Stretches  Neural tension stretch with Lt UE 5 sec, 5 times returning therapist demonstration    Other Shoulder Stretches  Modified downward dog on wall 5x, 5 sec holds      Manual Therapy   Manual Therapy  Myofascial release;Passive ROM    Myofascial Release  To Lt axilla and upper arm during P/ROM    Passive ROM  In  Supine to Lt shoulder into flexion, abduction and D2 slowly with slow, prolonged holds all to pts tolerance.              PT Education - 06/08/18 1605    Education Details  Supine dowel exercises    Person(s) Educated  Patient    Methods  Explanation;Demonstration;Handout    Comprehension  Verbalized understanding;Need further instruction          PT Long Term Goals - 06/01/18 1406      PT LONG TERM GOAL #1   Time  4    Period  Weeks    Status  New      PT LONG TERM GOAL #2   Title  Pt. will show at least 145 degrees of left shoulder abduction to enable radiation positoning easily.    Time  4    Period  Weeks    Status  New      PT LONG TERM GOAL #3   Title  Pt. will show good understanding of lymphedema risk reduction practices.    Time  4    Period  Weeks    Status  New      PT LONG TERM GOAL #4   Title  Patient  will report at least 75% decrease in discomfort overall in left UE.    Time  4    Period  Weeks    Status  New            Plan - 06/08/18 1713    Clinical Impression Statement  Pt tolerated first session of AA/ P/ROM well. She required multiple VCs to decrease scapular compensation with AA/ROM along with tactile cues. With manual therapy initially pt required multiple VCs to relax but as we continued to stretch she was ale to completely relax and by end of session her end P/ROM had improved considerably. She also reported tightness in general felt improved. Issued supine dowel exercises verbally and with demonstration at end of session and will further instruct her in these at next visit. Also dimpling was very minimal today in upper arm, especially by end of session.    Rehab Potential  Excellent    PT Frequency  2x / week    PT Duration  4 weeks    PT Treatment/Interventions  ADLs/Self Care Home Management;Moist Heat;DME Instruction;Therapeutic exercise;Patient/family education;Orthotic Fit/Training;Manual techniques;Manual lymph drainage;Scar mobilization;Passive range of motion;Taping    PT Next Visit Plan  Instruct in supine dowel exercises (hadnout issued today with demo but did have time to have pt perform) cont left shoulder P/AA/A/ROM with stretching and mobilization of cording in left UE; teach HEP for same.    Consulted and Agree with Plan of Care  Patient       Patient will benefit from skilled therapeutic intervention in order to improve the following deficits and impairments:  Decreased range of motion, Pain, Impaired UE functional use  Visit Diagnosis: Stiffness of left shoulder, not elsewhere classified  Left shoulder pain, unspecified chronicity  Disorder of the skin and subcutaneous tissue related to radiation, unspecified     Problem List Patient Active Problem List   Diagnosis Date Noted  . 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 Breast cancer, left ([place]) 10/14/2017  . Port catheter in place 07/20/2017  . 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 Malignant (primary) neoplasm, unspecified ([place]) 04/26/2017  . Secondary and unspecified malignant  neoplasm of axilla and upper limb lymph nodes ([place]) 04/21/2017    [name], PTA 06/08/2018, 5:20 PM  [place], [place], [address] Phone: [phone]   Fax:  [phone]  Name: Melissa [name] MRN: [id number] Date of Birth: Jun 24, 1961

## 2018-06-13 ENCOUNTER — Encounter: Payer: Self-pay | Admitting: Physical Therapy

## 2018-06-13 ENCOUNTER — Ambulatory Visit: Payer: BLUE CROSS/BLUE SHIELD | Admitting: Physical Therapy

## 2018-06-13 DIAGNOSIS — M25512 Pain in left shoulder: Secondary | ICD-10-CM

## 2018-06-13 DIAGNOSIS — M25612 Stiffness of left shoulder, not elsewhere classified: Secondary | ICD-10-CM

## 2018-06-13 DIAGNOSIS — L599 Disorder of the skin and subcutaneous tissue related to radiation, unspecified: Secondary | ICD-10-CM | POA: Diagnosis not present

## 2018-06-13 NOTE — Therapy (Signed)
[place], [place], [address] Phone: [phone]   Fax:  [phone]  Physical Therapy Treatment  Patient Details  Name: Melissa [name] MRN: [id number] Date of Birth: [birth year] Referring Provider: Dr. Rolm Bookbinder   Encounter Date: 06/13/2018  PT End of Session - 06/13/18 1705    Visit Number  3    Number of Visits  9    Date for PT Re-Evaluation  07/06/18    PT Start Time  5625    PT Stop Time  1346    PT Time Calculation (min)  41 min    Activity Tolerance  Patient tolerated treatment well    Behavior During Therapy  [facility] for tasks assessed/performed       Past Medical History:  Diagnosis Date  . Breast cancer ([place]) 04/2017   left breast  . Cervical cancer ([place])    1990, treated with hysterectomy only  . GERD (gastroesophageal reflux disease)   . History of radiation therapy 12/09/17- 01/12/18   Left Breast treated to 50 Gy with 25 fx of 2 Gy  . Hypercholesteremia   . Hypertension     Past Surgical History:  Procedure Laterality Date  . ABDOMINAL HYSTERECTOMY  1990  . AXILLARY LYMPH NODE DISSECTION Left 10/14/2017   Procedure: LEFT AXILLARY LYMPH NODE DISSECTION;  Surgeon: Rolm Bookbinder, MD;  Location: [facility];  Service: General;  Laterality: Left;  . EYE SURGERY     eye lift  . PORTACATH PLACEMENT Right 05/12/2017   Procedure: INSERTION PORT-A-CATH WITH ULTRASOUND;  Surgeon: Rolm Bookbinder, MD;  Location: [facility];  Service: General;  Laterality: Right;    There were no vitals filed for this visit.  Subjective Assessment - 06/13/18 1307    Subjective  My shoulder is better today even with the one treatment.     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 No tumor was found in breast. Finished chemo 08/11/17 with maintenance through May 10, 2018. Radiation completed 01/11/18. On Arimidex and  starting another chemo pill. Had HTN but is weaning off low dose of meds now. Hyperlipidemia.     Patient Stated Goals  Improve shoulder ROM by quite a bit.    Currently in Pain?  No/denies    Pain Score  0-No pain         [facility] PT Assessment - 06/13/18 0001      AROM   Left Shoulder Flexion  140 Degrees    Left Shoulder ABduction  120 Degrees                   [facility] Adult PT Treatment/Exercise - 06/13/18 0001      Shoulder Exercises: Pulleys   Flexion  2 minutes    Flexion Limitations  Pt returned therapist demonstration    ABduction  2 minutes    ABduction Limitations  Pt returned therapist demonstration      Shoulder Exercises: Therapy Ball   Flexion  Both;10 reps Forward lean into end of stretch    ABduction  Left;10 reps Same side lean into end of stretch      Manual Therapy   Manual Therapy  Myofascial release;Passive ROM    Myofascial Release  To Lt axilla and upper arm during P/ROM    Passive ROM  In Supine to Lt shoulder into flexion, abduction and D2 slowly with slow, prolonged holds all to pts  tolerance.                   PT Long Term Goals - 06/01/18 1406      PT LONG TERM GOAL #1   Time  4    Period  Weeks    Status  New      PT LONG TERM GOAL #2   Title  Pt. will show at least 145 degrees of left shoulder abduction to enable radiation positoning easily.    Time  4    Period  Weeks    Status  New      PT LONG TERM GOAL #3   Title  Pt. will show good understanding of lymphedema risk reduction practices.    Time  4    Period  Weeks    Status  New      PT LONG TERM GOAL #4   Title  Patient will report at least 75% decrease in discomfort overall in left UE.    Time  4    Period  Weeks    Status  New            Plan - 06/13/18 1706    Clinical Impression Statement  Pt demonstrates increased AROM today after last session. Continued with AAROM exercises today and pt required less verbal cueing. Performed PROM and pt  required occasional verbal cues to relax. Was unable to palpate cording today but pt did feel pulling down to her elbow during PROM.     Rehab Potential  Excellent    PT Frequency  2x / week    PT Duration  4 weeks    PT Treatment/Interventions  ADLs/Self Care Home Management;Moist Heat;DME Instruction;Therapeutic exercise;Patient/family education;Orthotic Fit/Training;Manual techniques;Manual lymph drainage;Scar mobilization;Passive range of motion;Taping    PT Next Visit Plan  Instruct in supine dowel exercises (hadnout issued today with demo but did have time to have pt perform) cont left shoulder P/AA/A/ROM with stretching and mobilization of cording in left UE; teach HEP for same.    PT Home Exercise Plan  suggested patient use compression garments daily for now    Consulted and Agree with Plan of Care  Patient       Patient will benefit from skilled therapeutic intervention in order to improve the following deficits and impairments:  Decreased range of motion, Pain, Impaired UE functional use  Visit Diagnosis: Stiffness of left shoulder, not elsewhere classified  Left shoulder pain, unspecified chronicity     Problem List Patient Active Problem List   Diagnosis Date Noted  . 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 Breast cancer, left ([place]) 10/14/2017  . Port catheter in place 07/20/2017  . 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 Malignant (primary) neoplasm, unspecified ([place]) 04/26/2017  . 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[place]) 04/21/2017    Allyson Sabal [facility] 06/13/2018, 5:09 PM  [place], [place], [address] Phone: [phone]   Fax:  [phone]  Name: Melissa [name] MRN: [id number] Date of Birth: 1961-01-25  Manus Gunning,  PT 06/13/18 5:09 PM

## 2018-06-14 ENCOUNTER — Ambulatory Visit: Payer: BLUE CROSS/BLUE SHIELD | Admitting: Hematology and Oncology

## 2018-06-14 ENCOUNTER — Other Ambulatory Visit: Payer: BLUE CROSS/BLUE SHIELD

## 2018-06-15 ENCOUNTER — Ambulatory Visit: Payer: BLUE CROSS/BLUE SHIELD

## 2018-06-15 DIAGNOSIS — L599 Disorder of the skin and subcutaneous tissue related to radiation, unspecified: Secondary | ICD-10-CM

## 2018-06-15 DIAGNOSIS — M25512 Pain in left shoulder: Secondary | ICD-10-CM | POA: Diagnosis not present

## 2018-06-15 DIAGNOSIS — M25612 Stiffness of left shoulder, not elsewhere classified: Secondary | ICD-10-CM

## 2018-06-15 NOTE — Patient Instructions (Signed)

## 2018-06-15 NOTE — Therapy (Signed)
[place], [place], [address] Phone: [phone]   Fax:  [phone]  Physical Therapy Treatment  Patient Details  Name: Melissa [name] MRN: [id number] Date of Birth: May 02, 1961 Referring Provider: Dr. Rolm Bookbinder   Encounter Date: 06/15/2018  PT End of Session - 06/15/18 1157    Visit Number  4    Number of Visits  9    Date for PT Re-Evaluation  07/06/18    PT Start Time  1105    PT Stop Time  1149    PT Time Calculation (min)  44 min    Activity Tolerance  Patient tolerated treatment well    Behavior During Therapy  [facility] for tasks assessed/performed       Past Medical History:  Diagnosis Date  . Breast cancer ([place]) 04/2017   left breast  . Cervical cancer ([place])    1990, treated with hysterectomy only  . GERD (gastroesophageal reflux disease)   . History of radiation therapy 12/09/17- 01/12/18   Left Breast treated to 50 Gy with 25 fx of 2 Gy  . Hypercholesteremia   . Hypertension     Past Surgical History:  Procedure Laterality Date  . ABDOMINAL HYSTERECTOMY  1990  . AXILLARY LYMPH NODE DISSECTION Left 10/14/2017   Procedure: LEFT AXILLARY LYMPH NODE DISSECTION;  Surgeon: Rolm Bookbinder, MD;  Location: [place];  Service: General;  Laterality: Left;  . EYE SURGERY     eye lift  . PORTACATH PLACEMENT Right 05/12/2017   Procedure: INSERTION PORT-A-CATH WITH ULTRASOUND;  Surgeon: Rolm Bookbinder, MD;  Location: [place];  Service: General;  Laterality: Right;    There were no vitals filed for this visit.  Subjective Assessment - 06/15/18 1111    Subjective  I can't believe how much better my Lt shoulder is feeling after just a visit or 2. I haven't got to do the HEP you gave me yet but I've been swimming twice and I went and cleaned my daugthers house yesterday which involved alot of vaccuuming and sweeping and just cleaning in general. And none of  that flared me up, in fact, I felt better after!    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 No tumor was found in breast. Finished chemo 08/11/17 with maintenance through May 10, 2018. Radiation completed 01/11/18. On Arimidex and starting another chemo pill. Had HTN but is weaning off low dose of meds now. Hyperlipidemia.     Patient Stated Goals  Improve shoulder ROM by quite a bit.    Currently in Pain?  No/denies                       [facility] Adult PT Treatment/Exercise - 06/15/18 0001      Shoulder Exercises: Supine   Horizontal ABduction  Strengthening;Both;10 reps;Theraband    Theraband Level (Shoulder Horizontal ABduction)  Level 2 (Red)    External Rotation  Strengthening;Both;10 reps;Theraband    Theraband Level (Shoulder External Rotation)  Level 2 (Red)    Flexion  Strengthening;Both;10 reps;Theraband Narrow and Wide Grip, 10 times each    Diagonals  Strengthening;Right;Left;10 reps;Theraband    Theraband Level (Shoulder Diagonals)  Level 2 (Red)      Shoulder Exercises: Standing   External Rotation  AAROM;Left 2 reps but no stretch so stopped    Flexion  AAROM;Left;5 reps with dowel for 5 sec  ABduction  AAROM;Left;5 reps with dowel for 5 sec      Shoulder Exercises: Pulleys   Flexion  2 minutes    ABduction  2 minutes      Shoulder Exercises: Therapy Ball   Flexion  Both;10 reps 1 lb on Lt wrise; forward lean into end of stretch    ABduction  Left;10 reps 1 lb on wrist; same side lean into end of stretch      Manual Therapy   Manual Therapy  Myofascial release;Passive ROM    Myofascial Release  To Lt axilla and upper arm during P/ROM    Passive ROM  In Supine to Lt shoulder into flexion, abduction and D2 slowly with slow, prolonged holds all to pts tolerance.              PT Education - 06/15/18 1133    Education Details  Supine scapular series (after reviewing supine and standing dowel  exercises)    Person(s) Educated  Patient    Methods  Demonstration;Explanation;Handout    Comprehension  Verbalized understanding;Returned demonstration          PT Long Term Goals - 06/01/18 1406      PT LONG TERM GOAL #1   Time  4    Period  Weeks    Status  New      PT LONG TERM GOAL #2   Title  Pt. will show at least 145 degrees of left shoulder abduction to enable radiation positoning easily.    Time  4    Period  Weeks    Status  New      PT LONG TERM GOAL #3   Title  Pt. will show good understanding of lymphedema risk reduction practices.    Time  4    Period  Weeks    Status  New      PT LONG TERM GOAL #4   Title  Patient will report at least 75% decrease in discomfort overall in left UE.    Time  4    Period  Weeks    Status  New            Plan - 06/15/18 1159    Clinical Impression Statement  Pt continues to be very pleased with her progress in a weeks period. Reviewed supine dowel exercises and also had her try in standing. She tolerated both well and reported only feeling a stretch instead of pain. Also progressed pt to include supine scapular series with red theraband as she is progressing so well. Answered pts questions regarding best times to wear her compression sleeve at times of incresaed activity, she verbalized understanding.     Rehab Potential  Excellent    PT Frequency  2x / week    PT Duration  4 weeks    PT Treatment/Interventions  ADLs/Self Care Home Management;Moist Heat;DME Instruction;Therapeutic exercise;Patient/family education;Orthotic Fit/Training;Manual techniques;Manual lymph drainage;Scar mobilization;Passive range of motion;Taping    PT Next Visit Plan  Cont left shoulder P/AA/A/ROM with stretching and mobilization of cording in left UE; review HEP    Consulted and Agree with Plan of Care  Patient       Patient will benefit from skilled therapeutic intervention in order to improve the following deficits and impairments:   Decreased range of motion, Pain, Impaired UE functional use  Visit Diagnosis: Stiffness of left shoulder, not elsewhere classified  Left shoulder pain, unspecified chronicity  Disorder of the skin and subcutaneous tissue related to radiation, unspecified  Problem List Patient Active Problem List   Diagnosis Date Noted  . 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 Breast cancer, left ([place]) 10/14/2017  . Port catheter in place 07/20/2017  . 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 Malignant (primary) neoplasm, unspecified ([place]) 04/26/2017  . 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[place]) 04/21/2017    Golda Acre 06/15/2018, 12:25 PM  [place], [place], [address] Phone: [phone]   Fax:  [phone]  Name: ARDEAN SIMONICH MRN: [id number] Date of Birth: 1961/11/04

## 2018-06-17 ENCOUNTER — Telehealth: Payer: Self-pay | Admitting: Pharmacist

## 2018-06-17 NOTE — Telephone Encounter (Signed)
Oral Oncology Pharmacist Encounter  Received call from patient with complaints of continued diarrhea, especially during the night. Patient states she is taking her budesonide 9 mg tablet daily. She is taking her prophylactic Imodium at 4 mg 5 times daily. This is controlling her daytime symptoms. She has added Lomotil 4 times daily to current regimen as she is experiencing 5-6 episodes of diarrhea each night which is impairing her ability to sleep. Patient continues on [name] 40 mg tablets, 4 tablets (160 mg) by mouth once daily with food. Patient is extremely willing to continue to add antidiarrheal agents to help manage symptoms in order to stay on her [name].  She will stay at [name] 160mg  daily through the weekend, per patient preference. She will take Lomotil x 2 prior to bedtime each night of the weekend to see if that helps control symptoms.  She will follow-up in the office with Dr. Lindi Adie on Monday 06/20/18.  Above discussed with MD.  Johny Drilling, PharmD, BCPS, BCOP  06/17/2018 10:07 AM Oral Oncology Clinic [phone]

## 2018-06-20 ENCOUNTER — Encounter: Payer: Self-pay | Admitting: *Deleted

## 2018-06-20 ENCOUNTER — Inpatient Hospital Stay: Payer: BLUE CROSS/BLUE SHIELD

## 2018-06-20 ENCOUNTER — Telehealth: Payer: Self-pay | Admitting: Hematology and Oncology

## 2018-06-20 ENCOUNTER — Inpatient Hospital Stay: Payer: BLUE CROSS/BLUE SHIELD | Attending: Hematology and Oncology | Admitting: Hematology and Oncology

## 2018-06-20 DIAGNOSIS — C801 Malignant (primary) neoplasm, unspecified: Secondary | ICD-10-CM | POA: Diagnosis not present

## 2018-06-20 DIAGNOSIS — C773 Secondary and unspecified malignant neoplasm of axilla and upper limb lymph nodes: Secondary | ICD-10-CM

## 2018-06-20 DIAGNOSIS — Z923 Personal history of irradiation: Secondary | ICD-10-CM | POA: Diagnosis not present

## 2018-06-20 DIAGNOSIS — Z9221 Personal history of antineoplastic chemotherapy: Secondary | ICD-10-CM | POA: Diagnosis not present

## 2018-06-20 DIAGNOSIS — Z79811 Long term (current) use of aromatase inhibitors: Secondary | ICD-10-CM | POA: Diagnosis not present

## 2018-06-20 DIAGNOSIS — Z170 Estrogen receptor positive status [ER+]: Secondary | ICD-10-CM

## 2018-06-20 LAB — CMP (CANCER CENTER ONLY)
ALT: 16 U/L (ref 0–44)
ANION GAP: 10 (ref 5–15)
AST: 19 U/L (ref 15–41)
Albumin: 4.1 g/dL (ref 3.5–5.0)
Alkaline Phosphatase: 92 U/L (ref 38–126)
BUN: 22 mg/dL — AB (ref 6–20)
CHLORIDE: 102 mmol/L (ref 98–111)
CO2: 28 mmol/L (ref 22–32)
Calcium: 10.2 mg/dL (ref 8.9–10.3)
Creatinine: 0.99 mg/dL (ref 0.44–1.00)
Glucose, Bld: 90 mg/dL (ref 70–99)
POTASSIUM: 3.2 mmol/L — AB (ref 3.5–5.1)
Sodium: 140 mmol/L (ref 135–145)
Total Bilirubin: 0.6 mg/dL (ref 0.3–1.2)
Total Protein: 7.6 g/dL (ref 6.5–8.1)

## 2018-06-20 LAB — CBC WITH DIFFERENTIAL (CANCER CENTER ONLY)
Basophils Absolute: 0 10*3/uL (ref 0.0–0.1)
Basophils Relative: 0 %
Eosinophils Absolute: 0 10*3/uL (ref 0.0–0.5)
Eosinophils Relative: 1 %
HEMATOCRIT: 38.5 % (ref 34.8–46.6)
Hemoglobin: 12.9 g/dL (ref 11.6–15.9)
LYMPHS PCT: 16 %
Lymphs Abs: 1.3 10*3/uL (ref 0.9–3.3)
MCH: 30.3 pg (ref 25.1–34.0)
MCHC: 33.6 g/dL (ref 31.5–36.0)
MCV: 90.1 fL (ref 79.5–101.0)
Monocytes Absolute: 0.5 10*3/uL (ref 0.1–0.9)
Monocytes Relative: 6 %
NEUTROS ABS: 6.2 10*3/uL (ref 1.5–6.5)
NEUTROS PCT: 77 %
Platelet Count: 241 10*3/uL (ref 145–400)
RBC: 4.27 MIL/uL (ref 3.70–5.45)
RDW: 13.2 % (ref 11.2–14.5)
WBC: 8.1 10*3/uL (ref 3.9–10.3)

## 2018-06-20 MED ORDER — LOPERAMIDE HCL 2 MG PO CAPS: 4.0000 mg | ORAL_CAPSULE | Freq: Three times a day (TID) | ORAL | 2 refills | Status: DC | PRN

## 2018-06-20 NOTE — Progress Notes (Signed)
Patient Care Team: Deland Pretty, MD as PCP - General (Internal Medicine)  DIAGNOSIS:  Encounter Diagnoses  Name Primary?  . 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[place])   . Malignant (primary) neoplasm, unspecified ([place])     SUMMARY OF ONCOLOGIC HISTORY:   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[place])   04/14/2017 Initial Diagnosis    Left axillary lymph node biopsy: Metastatic carcinoma positive for CK 7, ER positive,GATA-3 equivocal, negative for CK 20,GCDFP, CDX-2, TTF-1; HER-2 positive ratio 2.08; mammogram and ultrasound revealed 3 discrete abnormal left axillary lymph nodes largest measures 1.8 cm      04/23/2017 Breast MRI    Left axillary lymphadenopathy numerous enlarged level I axillary lymph nodes largest 2.1 cm, no other abdominal masses in the left breast itself, no MRI evidence of malignancy in the right breast      04/26/2017 Imaging    CT CAP: Prominent left axillary lymph nodes no distant metastases, sclerotic focus left pedicle of T9 vertebra favored benign, T5 vertebral hemangioma      05/12/2017 - 08/31/2017 Neo-Adjuvant Chemotherapy    TCH Perjeta x6 followed by Herceptin Perjeta maintenance for 1 year      10/14/2017 Surgery    Left axillary lymph node dissection: 0/13 lymph nodes complete pathologic response.  Breast was not operated on because no breast primary was ever identified      12/09/2017 - 01/11/2018 Radiation Therapy    Adjuvant radiation therapy      01/30/2018 -  Anti-estrogen oral therapy    Anastrozole 1 mg daily       CHIEF COMPLIANT: Follow-up on anastrozole therapy  INTERVAL HISTORY: Melissa [name] is a 57 year old with above-mentioned history of HER-2 positive breast cancer who completed neoadjuvant chemotherapy followed by surgery and radiation is currently finished Herceptin maintenance in June 2019.  She was started on Neratinib and is here today for  toxicity evaluation. She was having diarrhea at nighttime.  She started taking Lomotil at nighttime and her diarrhea subsided.  She is planning on increasing the dosage of Neratinib to 5 tablets a day and then if she tolerates it to go up to full dose of 6 tablets a day.  REVIEW OF SYSTEMS:   Constitutional: Denies fevers, chills or abnormal weight loss Eyes: Denies blurriness of vision Ears, nose, mouth, throat, and face: Denies mucositis or sore throat Respiratory: Denies cough, dyspnea or wheezes Cardiovascular: Denies palpitation, chest discomfort Gastrointestinal: Diarrhea Skin: Denies abnormal skin rashes Lymphatics: Denies new lymphadenopathy or easy bruising Neurological:Denies numbness, tingling or new weaknesses Behavioral/Psych: Mood is stable, no new changes  Extremities: No lower extremity edema All other systems were reviewed with the patient and are negative.  I have reviewed the past medical history, past surgical history, social history and family history with the patient and they are unchanged from previous note.  ALLERGIES:  has No Known Allergies.  MEDICATIONS:  Current Outpatient Medications  Medication Sig Dispense Refill  . ALPRAZolam (XANAX) 0.5 MG tablet Take 0.5 mg by mouth 3 (three) times daily as needed for anxiety.   0  . anastrozole (ARIMIDEX) 1 MG tablet Take 1 mg by mouth daily.    . Budesonide ER (UCERIS) 9 MG TB24 Take 9 mg by mouth daily. 30 tablet 2  . calcium carbonate (OS-CAL) 1250 (500 Ca) MG chewable tablet Chew 1 tablet by mouth daily. Calcium 600 mg    . cholecalciferol (VITAMIN D)  1000 units tablet Take 1,000 Units by mouth daily. 2000 iu    . levocetirizine (XYZAL) 5 MG tablet Take 5 mg by mouth every evening.    . loperamide (IMODIUM) 2 MG capsule Take 2 capsules (4 mg total) by mouth 3 (three) times daily as needed for diarrhea or loose stools. 90 capsule 2  . Neratinib Maleate ([name]) 40 MG tablet Take 6 tablets (240 mg total) by mouth  daily. Take with food. 180 tablet 3  . rosuvastatin (CRESTOR) 10 MG tablet Take 10 mg by mouth daily.   0  . triamterene-hydrochlorothiazide (MAXZIDE-25) 37.5-25 MG tablet Take 1 tablet by mouth daily.    . Turmeric 500 MG TABS Take by mouth.    . zolpidem (AMBIEN) 5 MG tablet TAKE 1 TABLET BY MOUTH AT BEDTIME AS NEEDED FOR SLEEP 90 tablet 3   No current facility-administered medications for this visit.     PHYSICAL EXAMINATION: ECOG PERFORMANCE STATUS: 2 - Symptomatic, <50% confined to bed  Vitals:   06/20/18 1021  BP: (!) 154/76  Pulse: 74  Resp: 19  Temp: 98.2 F (36.8 C)  SpO2: 100%   Filed Weights   06/20/18 1021  Weight: 141 lb 11.2 oz (64.3 kg)    GENERAL:alert, no distress and comfortable SKIN: skin color, texture, turgor are normal, no rashes or significant lesions EYES: normal, Conjunctiva are pink and non-injected, sclera clear OROPHARYNX:no exudate, no erythema and lips, buccal mucosa, and tongue normal  NECK: supple, thyroid normal size, non-tender, without nodularity LYMPH:  no palpable lymphadenopathy in the cervical, axillary or inguinal LUNGS: clear to auscultation and percussion with normal breathing effort HEART: regular rate & rhythm and no murmurs and no lower extremity edema ABDOMEN:abdomen soft, non-tender and normal bowel sounds MUSCULOSKELETAL:no cyanosis of digits and no clubbing  NEURO: alert & oriented x 3 with fluent speech, no focal motor/sensory deficits EXTREMITIES: No lower extremity edema  LABORATORY DATA:  I have reviewed the data as listed CMP Latest Ref Rng & Units 05/10/2018 04/19/2018 03/29/2018  Glucose 70 - 140 mg/dL 86 108 80  BUN 7 - 26 mg/dL 21 19 22   Creatinine 0.60 - 1.10 mg/dL 0.84 0.86 0.90  Sodium 136 - 145 mmol/L 138 140 138  Potassium 3.5 - 5.1 mmol/L 3.9 3.3(L) 4.0  Chloride 98 - 109 mmol/L 101 101 102  CO2 22 - 29 mmol/L 28 30(H) 26  Calcium 8.4 - 10.4 mg/dL 10.0 10.0 9.9  Total Protein 6.4 - 8.3 g/dL 7.4 7.0 7.1    Total Bilirubin 0.2 - 1.2 mg/dL 0.2 0.3 0.3  Alkaline Phos 40 - 150 U/L 89 80 89  AST 5 - 34 U/L 19 19 22   ALT 0 - 55 U/L 15 19 15     Lab Results  Component Value Date   WBC 8.1 06/20/2018   HGB 12.9 06/20/2018   HCT 38.5 06/20/2018   MCV 90.1 06/20/2018   PLT 241 06/20/2018   NEUTROABS 6.2 06/20/2018    ASSESSMENT & PLAN:  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 Neoadjuvant TCH Perjeta 6 cycles completed 08/31/2017 followed by Herceptin, Perjeta maintenance for 1 year 2. followed by axillary lymph node dissection. Breast surgery  will not be performed because there is no primary tumor identifiable on breast MRI: 10/14/17: 0/13 LN Negative 3. Followed by radiation 12/08/2017-01/12/2018 4. Followed by antiestrogen therapy started 01/30/2018 5. Herceptin Perjeta maintenance completed 05/10/2018 6. Neratinib started 05/13/2018 ------------------------------------------------------------------------------------------------------------------ Current Treatment:Anastrozole with Neratinib  Neratinib toxicities:  1. Diarrhea especially at night  This is in spite of budesonide as well as Imodium. she started taking Lomotil at bedtime and her diarrhea subsided. She is otherwise tolerating the treatment very well.  Letrozole toxicities: Occasional stiffness as well as hot flashes but not worrying her significantly.  Return to clinic in 3 months to follow-up    Orders Placed This Encounter  Procedures  . CBC with Differential (Cancer Center Only)    Standing Status:   Future    Standing Expiration Date:   06/21/2019  .  CMP ([facility] only)    Standing Status:   Future    Standing Expiration Date:   06/21/2019   The patient has a good understanding of the overall plan. she agrees with it. she will call with any problems that may develop before the next visit here.   Harriette Ohara, MD 06/20/18

## 2018-06-20 NOTE — Telephone Encounter (Signed)
Gave patient AVS and Calendar.  °

## 2018-06-20 NOTE — Assessment & Plan Note (Signed)
04/14/2017 Left axillary lymph node biopsy: Metastatic carcinoma positive for CK 7, ER positive,GATA-3 equivocal, negative for CK 20,GCDFP, CDX-2, TTF-1; HER-2 positive ratio 2.08 mammogram and ultrasound revealed 3 discrete abnormal left axillary lymph nodes largest measures 1.8 cm Clinical stage: [place] N1MX  Treatment plan: 1. Neoadjuvant TCH Perjeta 6 cycles completed 08/31/2017 followed by Herceptin, Perjeta maintenance for 1 year 2. followed by axillary lymph node dissection. Breast surgery will not be performed because there is no primary tumor identifiable on breast MRI: 10/14/17: 0/13 LN Negative 3. Followed by radiation 12/08/2017-01/12/2018 4. Followed by antiestrogen therapy started 01/30/2018 5. Herceptin Perjeta maintenance completed 05/10/2018 6. Neratinib started 05/13/2018 ------------------------------------------------------------------------------------------------------------------ Current Treatment:Anastrozole with Neratinib  Neratinib toxicities:  Letrozole toxicities:  Return to clinic in 2 months to follow-up

## 2018-06-21 ENCOUNTER — Telehealth: Payer: Self-pay

## 2018-06-21 ENCOUNTER — Ambulatory Visit: Payer: BLUE CROSS/BLUE SHIELD

## 2018-06-21 ENCOUNTER — Other Ambulatory Visit: Payer: BLUE CROSS/BLUE SHIELD

## 2018-06-21 DIAGNOSIS — L599 Disorder of the skin and subcutaneous tissue related to radiation, unspecified: Secondary | ICD-10-CM

## 2018-06-21 DIAGNOSIS — M25512 Pain in left shoulder: Secondary | ICD-10-CM | POA: Diagnosis not present

## 2018-06-21 DIAGNOSIS — M25612 Stiffness of left shoulder, not elsewhere classified: Secondary | ICD-10-CM | POA: Diagnosis not present

## 2018-06-21 NOTE — Telephone Encounter (Signed)
Spoke with pt regarding potassium results.  Asked if she was taking any potassium.  Pt said no but has it, Dr. Lindi Adie had prescribed it for her but would start on it.  She was putting off taking it.  She said she knew her potassium has always been low.  No other questions/concerns at this time.

## 2018-06-21 NOTE — Telephone Encounter (Signed)
-----   Message from Gardenia Phlegm, NP sent at 06/21/2018  2:58 PM EDT ----- Please see if patient is taking any potassium? ----- Message ----- From: Interface, Lab In [place] Sent: 06/20/2018  10:27 AM To: Gardenia Phlegm, NP

## 2018-06-21 NOTE — Therapy (Signed)
[place], [place], [address] Phone: [phone]   Fax:  [phone]  Physical Therapy Treatment  Patient Details  Name: Melissa [name] MRN: [id number] Date of Birth: November 02, 1961 Referring Provider: Dr. Rolm Bookbinder   Encounter Date: 06/21/2018  PT End of Session - 06/21/18 1128    Visit Number  5    Number of Visits  9    Date for PT Re-Evaluation  07/06/18    PT Start Time  1107    PT Stop Time  1150    PT Time Calculation (min)  43 min    Activity Tolerance  Patient tolerated treatment well    Behavior During Therapy  [facility] for tasks assessed/performed       Past Medical History:  Diagnosis Date  . Breast cancer ([place]) 04/2017   left breast  . Cervical cancer ([place])    1990, treated with hysterectomy only  . GERD (gastroesophageal reflux disease)   . History of radiation therapy 12/09/17- 01/12/18   Left Breast treated to 50 Gy with 25 fx of 2 Gy  . Hypercholesteremia   . Hypertension     Past Surgical History:  Procedure Laterality Date  . ABDOMINAL HYSTERECTOMY  1990  . AXILLARY LYMPH NODE DISSECTION Left 10/14/2017   Procedure: LEFT AXILLARY LYMPH NODE DISSECTION;  Surgeon: Rolm Bookbinder, MD;  Location: [place];  Service: General;  Laterality: Left;  . EYE SURGERY     eye lift  . PORTACATH PLACEMENT Right 05/12/2017   Procedure: INSERTION PORT-A-CATH WITH ULTRASOUND;  Surgeon: Rolm Bookbinder, MD;  Location: [place];  Service: General;  Laterality: Right;    There were no vitals filed for this visit.  Subjective Assessment - 06/21/18 1110    Subjective  My Lt shoulder is still feeling great. It's like a miracle! My daughter had her baby shower this weekend and we found out it's a boy! So everything is going really well. My shoulder isn't really sore when I leave here and I've been doing the theraband exercises you gave me last time, just a few  times since the weekend was so busy. But I did them about 3x and they went well    Pertinent History   Left breast cancer metastasized to axilla with 13 lymph nodes removed 10/14/17. No tumor was found in breast. Finished chemo 08/11/17 with maintenance through May 10, 2018. Radiation completed 01/11/18. On Arimidex and starting another chemo pill. Had HTN but is weaning off low dose of meds now. Hyperlipidemia.     Patient Stated Goals  Improve shoulder ROM by quite a bit.    Currently in Pain?  No/denies         [facility] PT Assessment - 06/21/18 0001      AROM   Left Shoulder Flexion  142 Degrees    Left Shoulder ABduction  152 Degrees    Left Shoulder External Rotation  88 Degrees                   [facility] Adult PT Treatment/Exercise - 06/21/18 0001      Shoulder Exercises: Supine   Horizontal ABduction  Strengthening;Both;5 reps;Theraband    Theraband Level (Shoulder Horizontal ABduction)  Level 3 (Green)    External Rotation  Strengthening;Both;5 reps;Theraband    Theraband Level (Shoulder External Rotation)  Level 3 (Green)    Flexion  Strengthening;Both;5 reps;Theraband Narrow and Wide Grip, 5 times each    Theraband  Level (Shoulder Flexion)  Level 3 (Green)    Diagonals  Strengthening;Right;Left;5 reps;Theraband    Theraband Level (Shoulder Diagonals)  Level 3 (Green)    Other Supine Exercises  Reviewed all above and issued green for pt to progress to at home, but istructed her to still use the red for now.       Shoulder Exercises: Pulleys   Flexion  2 minutes    ABduction  2 minutes    ABduction Limitations  Pt with noticeably improved ROM today with flex and abd      Shoulder Exercises: Therapy Ball   Flexion  Both;10 reps 1 lb on wrists; forward lean into end of stretch    ABduction  Left;10 reps 1 lb on wrist; same side lean into end of stretch      Manual Therapy   Manual Therapy  Myofascial release;Passive ROM    Myofascial Release  To Lt axilla and upper arm  during P/ROM    Passive ROM  In Supine to Lt shoulder into flexion, abduction and D2 slowly with slow, prolonged holds all to pts tolerance.                   PT Long Term Goals - 06/01/18 1406      PT LONG TERM GOAL #1   Time  4    Period  Weeks    Status  New      PT LONG TERM GOAL #2   Title  Pt. will show at least 145 degrees of left shoulder abduction to enable radiation positoning easily.    Time  4    Period  Weeks    Status  New      PT LONG TERM GOAL #3   Title  Pt. will show good understanding of lymphedema risk reduction practices.    Time  4    Period  Weeks    Status  New      PT LONG TERM GOAL #4   Title  Patient will report at least 75% decrease in discomfort overall in left UE.    Time  4    Period  Weeks    Status  New            Plan - 06/21/18 1218    Clinical Impression Statement  Reviewed supine scapular series with pt issuing green theraband but instructing her not to use this for another week or 2 until red becomes easier. Did 5 reps of each with green so pt could note difference which she could. Continued with end ROM stretch with AA/ and P/ROM. Also instructed pt to incoporate end ROM stetching into day, especially with using doorframes around her house. Pt returned good demo and verbalized understanding.     Rehab Potential  Excellent    PT Frequency  2x / week    PT Duration  4 weeks    PT Treatment/Interventions  ADLs/Self Care Home Management;Moist Heat;DME Instruction;Therapeutic exercise;Patient/family education;Orthotic Fit/Training;Manual techniques;Manual lymph drainage;Scar mobilization;Passive range of motion;Taping    PT Next Visit Plan  Cont left shoulder P/AA/A/ROM with stretching and mobilization of cording in left UE;     PT Home Exercise Plan  suggested patient use compression garments daily for now; end ROM stretching; supine scapular series    Consulted and Agree with Plan of Care  Patient       Patient will  benefit from skilled therapeutic intervention in order to improve the following deficits and impairments:  Decreased range of motion, Pain, Impaired UE functional use  Visit Diagnosis: Stiffness of left shoulder, not elsewhere classified  Left shoulder pain, unspecified chronicity  Disorder of the skin and subcutaneous tissue related to radiation, unspecified     Problem List Patient Active Problem List   Diagnosis Date Noted  . 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 Breast cancer, left ([place]) 10/14/2017  . Port catheter in place 07/20/2017  . 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 Malignant (primary) neoplasm, unspecified ([place]) 04/26/2017  . 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[place]) 04/21/2017    [name], PTA 06/21/2018, 12:28 PM  [place], [place], [address] Phone: [phone]   Fax:  [phone]  Name: Melissa [name] MRN: [id number] Date of Birth: 1961-11-27

## 2018-06-22 DIAGNOSIS — Z23 Encounter for immunization: Secondary | ICD-10-CM | POA: Diagnosis not present

## 2018-06-23 ENCOUNTER — Ambulatory Visit: Payer: BLUE CROSS/BLUE SHIELD

## 2018-06-23 DIAGNOSIS — M25612 Stiffness of left shoulder, not elsewhere classified: Secondary | ICD-10-CM | POA: Diagnosis not present

## 2018-06-23 DIAGNOSIS — L599 Disorder of the skin and subcutaneous tissue related to radiation, unspecified: Secondary | ICD-10-CM

## 2018-06-23 DIAGNOSIS — M25512 Pain in left shoulder: Secondary | ICD-10-CM

## 2018-06-23 NOTE — Therapy (Signed)
[place], [place], [address] Phone: [phone]   Fax:  [phone]  Physical Therapy Treatment  Patient Details  Name: Melissa [name] MRN: [id number] Date of Birth: 02/05/1961 Referring Provider: Dr. Rolm Bookbinder   Encounter Date: 06/23/2018  PT End of Session - 06/23/18 1151    Visit Number  6    Number of Visits  9    Date for PT Re-Evaluation  07/06/18    PT Start Time  1102    PT Stop Time  1147    PT Time Calculation (min)  45 min    Activity Tolerance  Patient tolerated treatment well    Behavior During Therapy  [facility] for tasks assessed/performed       Past Medical History:  Diagnosis Date  . Breast cancer ([place]) 04/2017   left breast  . Cervical cancer ([place])    1990, treated with hysterectomy only  . GERD (gastroesophageal reflux disease)   . History of radiation therapy 12/09/17- 01/12/18   Left Breast treated to 50 Gy with 25 fx of 2 Gy  . Hypercholesteremia   . Hypertension     Past Surgical History:  Procedure Laterality Date  . ABDOMINAL HYSTERECTOMY  1990  . AXILLARY LYMPH NODE DISSECTION Left 10/14/2017   Procedure: LEFT AXILLARY LYMPH NODE DISSECTION;  Surgeon: Rolm Bookbinder, MD;  Location: [place];  Service: General;  Laterality: Left;  . EYE SURGERY     eye lift  . PORTACATH PLACEMENT Right 05/12/2017   Procedure: INSERTION PORT-A-CATH WITH ULTRASOUND;  Surgeon: Rolm Bookbinder, MD;  Location: [place];  Service: General;  Laterality: Right;    There were no vitals filed for this visit.  Subjective Assessment - 06/23/18 1106    Subjective  I've been increasing my [name] and I'm up to the 5 pills as of today and that I'm really starting to feel. My stomach is a little unsettled today so I'll just have to see how that goes today and adjust my diarrhea meds.     Pertinent History   Left breast cancer metastasized to axilla with 13  lymph nodes removed 10/14/17. No tumor was found in breast. Finished chemo 08/11/17 with maintenance through May 10, 2018. Radiation completed 01/11/18. On Arimidex and starting another chemo pill. Had HTN but is weaning off low dose of meds now. Hyperlipidemia.     Patient Stated Goals  Improve shoulder ROM by quite a bit.    Currently in Pain?  No/denies                       [facility] Adult PT Treatment/Exercise - 06/23/18 0001      Shoulder Exercises: Standing   Other Standing Exercises  Bil UE 3 way raises with 1 lb on each wrist, 10 times each (flexion, scpation, and abduction to shoulder height)      Shoulder Exercises: Pulleys   Flexion  2 minutes    ABduction  2 minutes      Shoulder Exercises: Therapy Ball   Flexion  Both;15 reps Forward lean into end of stretch; 1 lb each wrist    ABduction  Left;10 reps Same sid elean into end of stretch; 1 lb on wrist      Manual Therapy   Manual Therapy  Myofascial release;Passive ROM    Myofascial Release  To Lt axilla and upper arm during P/ROM    Passive ROM  In Supine  to Lt shoulder into flexion, abduction and D2 slowly with slow, prolonged holds all to pts tolerance.                   PT Long Term Goals - 06/01/18 1406      PT LONG TERM GOAL #1   Time  4    Period  Weeks    Status  New      PT LONG TERM GOAL #2   Title  Pt. will show at least 145 degrees of left shoulder abduction to enable radiation positoning easily.    Time  4    Period  Weeks    Status  New      PT LONG TERM GOAL #3   Title  Pt. will show good understanding of lymphedema risk reduction practices.    Time  4    Period  Weeks    Status  New      PT LONG TERM GOAL #4   Title  Patient will report at least 75% decrease in discomfort overall in left UE.    Time  4    Period  Weeks    Status  New            Plan - 06/23/18 1152    Clinical Impression Statement  PT continues to tolerate therapy well and is making steady gains  with P/ROM. Also continues to note that though still limited with her end ROM, this has and continues to improve weekly. Progressed her today to include 3 way raises wht 1 lb which she reported feeling challenged by. VCs for correct posture and correct technique.     Rehab Potential  Excellent    PT Frequency  2x / week    PT Duration  4 weeks    PT Treatment/Interventions  ADLs/Self Care Home Management;Moist Heat;DME Instruction;Therapeutic exercise;Patient/family education;Orthotic Fit/Training;Manual techniques;Manual lymph drainage;Scar mobilization;Passive range of motion;Taping    PT Next Visit Plan  See if pt wearing her compression sleeve and if this isseeming to help decrease feelings of cording; Remeasure ROM; Cont left shoulder P/AA/A/ROM with stretching and mobilization of cording in left UE;     Consulted and Agree with Plan of Care  Patient       Patient will benefit from skilled therapeutic intervention in order to improve the following deficits and impairments:  Decreased range of motion, Pain, Impaired UE functional use  Visit Diagnosis: Stiffness of left shoulder, not elsewhere classified  Left shoulder pain, unspecified chronicity  Disorder of the skin and subcutaneous tissue related to radiation, unspecified     Problem List Patient Active Problem List   Diagnosis Date Noted  . 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 Breast cancer, left ([place]) 10/14/2017  . Port catheter in place 07/20/2017  . 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 Malignant (primary) neoplasm, unspecified ([place]) 04/26/2017  . 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[place]) 04/21/2017    [name], PTA 06/23/2018, 11:56 AM  [place], [place], [address] Phone:  [phone]   Fax:  [phone]  Name: Melissa [name] MRN: [id number] Date of Birth: 1961-02-13

## 2018-06-24 ENCOUNTER — Other Ambulatory Visit: Payer: Self-pay | Admitting: Hematology and Oncology

## 2018-06-28 ENCOUNTER — Ambulatory Visit: Payer: BLUE CROSS/BLUE SHIELD

## 2018-06-28 DIAGNOSIS — L599 Disorder of the skin and subcutaneous tissue related to radiation, unspecified: Secondary | ICD-10-CM

## 2018-06-28 DIAGNOSIS — M25512 Pain in left shoulder: Secondary | ICD-10-CM

## 2018-06-28 DIAGNOSIS — M25612 Stiffness of left shoulder, not elsewhere classified: Secondary | ICD-10-CM

## 2018-06-28 NOTE — Therapy (Signed)
[place], [place], [address] Phone: [phone]   Fax:  [phone]  Physical Therapy Treatment  Patient Details  Name: Melissa [name] MRN: [id number] Date of Birth: 1961/02/26 Referring Provider: Dr. Rolm Bookbinder   Encounter Date: 06/28/2018  PT End of Session - 06/28/18 1156    Visit Number  7    Number of Visits  9    Date for PT Re-Evaluation  07/06/18    PT Start Time  1108    PT Stop Time  1154    PT Time Calculation (min)  46 min    Activity Tolerance  Patient tolerated treatment well    Behavior During Therapy  [facility] for tasks assessed/performed       Past Medical History:  Diagnosis Date  . Breast cancer ([place]) 04/2017   left breast  . Cervical cancer ([place])    1990, treated with hysterectomy only  . GERD (gastroesophageal reflux disease)   . History of radiation therapy 12/09/17- 01/12/18   Left Breast treated to 50 Gy with 25 fx of 2 Gy  . Hypercholesteremia   . Hypertension     Past Surgical History:  Procedure Laterality Date  . ABDOMINAL HYSTERECTOMY  1990  . AXILLARY LYMPH NODE DISSECTION Left 10/14/2017   Procedure: LEFT AXILLARY LYMPH NODE DISSECTION;  Surgeon: Rolm Bookbinder, MD;  Location: [place];  Service: General;  Laterality: Left;  . EYE SURGERY     eye lift  . PORTACATH PLACEMENT Right 05/12/2017   Procedure: INSERTION PORT-A-CATH WITH ULTRASOUND;  Surgeon: Rolm Bookbinder, MD;  Location: [place];  Service: General;  Laterality: Right;    There were no vitals filed for this visit.  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 So I'm just really sore today and my diarrhea was really bad Thursday-Saturday so I'm just exhausted and really sore today.      Pertinent History   Left breast cancer metastasized to  axilla with 13 lymph nodes removed 10/14/17. No tumor was found in breast. Finished chemo 08/11/17 with maintenance through May 10, 2018. Radiation completed 01/11/18. On Arimidex and starting another chemo pill. Had HTN but is weaning off low dose of meds now. Hyperlipidemia.     Patient Stated Goals  Improve shoulder ROM by quite a bit.    Currently in Pain?  No/denies                       [facility] Adult PT Treatment/Exercise - 06/28/18 0001      Shoulder Exercises: Pulleys   Flexion  2 minutes    ABduction  2 minutes    ABduction Limitations  VCs to decrease scapular compensation      Shoulder Exercises: Therapy Ball   Flexion  Both;10 reps Forward lean into end of stretch    ABduction  Left;10 reps Same side lean into end of stretch      Manual Therapy   Manual Therapy  Myofascial release;Passive ROM    Myofascial Release  To Lt axilla and upper arm during P/ROM    Passive ROM  In Supine to Lt shoulder into flexion, abduction and D2 slowly with slow, prolonged holds all to pts tolerance.  PT Long Term Goals - 06/01/18 1406      PT LONG TERM GOAL #1   Time  4    Period  Weeks    Status  New      PT LONG TERM GOAL #2   Title  Pt. will show at least 145 degrees of left shoulder abduction to enable radiation positoning easily.    Time  4    Period  Weeks    Status  New      PT LONG TERM GOAL #3   Title  Pt. will show good understanding of lymphedema risk reduction practices.    Time  4    Period  Weeks    Status  New      PT LONG TERM GOAL #4   Title  Patient will report at least 75% decrease in discomfort overall in left UE.    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 But after session she reports her  soreness feeling much better and ROM felt improved. Pt was feeling overwhelmed when she got here from diarrhea over weeknd from upping her med again ([name]), her daughter is due in 2 weeks and was frustrated her shoulder got sore. After explaining to her the soreness was normal as this was the first big activity she'd done since her shoulder felt better and that it really could have been worse (she had forgotten to wear her compression sleeve too), pt did report feeling better about everything.     Rehab Potential  Excellent    PT Frequency  2x / week    PT Duration  4 weeks    PT Treatment/Interventions  ADLs/Self Care Home Management;Moist Heat;DME Instruction;Therapeutic exercise;Patient/family education;Orthotic Fit/Training;Manual techniques;Manual lymph drainage;Scar mobilization;Passive range of motion;Taping    PT Next Visit Plan  Remeasure ROM and assess goals; Cont left shoulder P/AA/A/ROM with stretching and mobilization of cording in left UE;     Consulted and Agree with Plan of Care  Patient       Patient will benefit from skilled therapeutic intervention in order to improve the following deficits and impairments:  Decreased range of motion, Pain, Impaired UE functional use  Visit Diagnosis: Stiffness of left shoulder, not elsewhere classified  Left shoulder pain, unspecified chronicity  Disorder of the skin and subcutaneous tissue related to radiation, unspecified     Problem List Patient Active Problem List   Diagnosis Date Noted  . 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 Breast cancer, left ([place]) 10/14/2017  . Port catheter in place 07/20/2017  . 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 Malignant (primary) neoplasm, unspecified ([place]) 04/26/2017  . 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[place]) 04/21/2017    [name], PTA 06/28/2018, 12:10 PM  [place], [place], [address] Phone: [phone]   Fax:  [phone]  Name: Melissa [name] MRN: [id number] Date of Birth: 1961-11-19

## 2018-06-30 ENCOUNTER — Ambulatory Visit: Payer: BLUE CROSS/BLUE SHIELD

## 2018-06-30 DIAGNOSIS — D235 Other benign neoplasm of skin of trunk: Secondary | ICD-10-CM | POA: Diagnosis not present

## 2018-06-30 DIAGNOSIS — L599 Disorder of the skin and subcutaneous tissue related to radiation, unspecified: Secondary | ICD-10-CM | POA: Diagnosis not present

## 2018-06-30 DIAGNOSIS — L814 Other melanin hyperpigmentation: Secondary | ICD-10-CM | POA: Diagnosis not present

## 2018-06-30 DIAGNOSIS — M25612 Stiffness of left shoulder, not elsewhere classified: Secondary | ICD-10-CM

## 2018-06-30 DIAGNOSIS — L570 Actinic keratosis: Secondary | ICD-10-CM | POA: Diagnosis not present

## 2018-06-30 DIAGNOSIS — L718 Other rosacea: Secondary | ICD-10-CM | POA: Diagnosis not present

## 2018-06-30 DIAGNOSIS — D225 Melanocytic nevi of trunk: Secondary | ICD-10-CM | POA: Diagnosis not present

## 2018-06-30 DIAGNOSIS — M25512 Pain in left shoulder: Secondary | ICD-10-CM

## 2018-06-30 DIAGNOSIS — L308 Other specified dermatitis: Secondary | ICD-10-CM | POA: Diagnosis not present

## 2018-06-30 NOTE — Therapy (Signed)
[place], [place], [address] Phone: [phone]   Fax:  [phone]  Physical Therapy Treatment  Patient Details  Name: Melissa [name] MRN: [id number] Date of Birth: 1961-01-26 Referring Provider: Dr. Rolm Bookbinder   Encounter Date: 06/30/2018  PT End of Session - 06/30/18 1019    Visit Number  8    Number of Visits  9    Date for PT Re-Evaluation  07/06/18    PT Start Time  0937    PT Stop Time  1018    PT Time Calculation (min)  41 min    Activity Tolerance  Patient tolerated treatment well    Behavior During Therapy  [facility] for tasks assessed/performed       Past Medical History:  Diagnosis Date  . Breast cancer ([place]) 04/2017   left breast  . Cervical cancer ([place])    1990, treated with hysterectomy only  . GERD (gastroesophageal reflux disease)   . History of radiation therapy 12/09/17- 01/12/18   Left Breast treated to 50 Gy with 25 fx of 2 Gy  . Hypercholesteremia   . Hypertension     Past Surgical History:  Procedure Laterality Date  . ABDOMINAL HYSTERECTOMY  1990  . AXILLARY LYMPH NODE DISSECTION Left 10/14/2017   Procedure: LEFT AXILLARY LYMPH NODE DISSECTION;  Surgeon: Rolm Bookbinder, MD;  Location: [place];  Service: General;  Laterality: Left;  . EYE SURGERY     eye lift  . PORTACATH PLACEMENT Right 05/12/2017   Procedure: INSERTION PORT-A-CATH WITH ULTRASOUND;  Surgeon: Rolm Bookbinder, MD;  Location: [place];  Service: General;  Laterality: Right;    There were no vitals filed for this visit.  Subjective Assessment - 06/30/18 0942    Subjective  My Lt shoulder has felt alot better since our last visit but my diarrhea got really bad Tuesday and yesterday.    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 No tumor was found in breast. Finished chemo 08/11/17 with maintenance through May 10, 2018. Radiation completed 01/11/18. On Arimidex and starting another chemo pill. Had HTN but is weaning off low dose of meds now. Hyperlipidemia.     Patient Stated Goals  Improve shoulder ROM by quite a bit.    Currently in Pain?  No/denies         [facility] PT Assessment - 06/30/18 0001      AROM   Left Shoulder Flexion  144 Degrees    Left Shoulder ABduction  156 Degrees                   [facility] Adult PT Treatment/Exercise - 06/30/18 0001      Shoulder Exercises: Standing   Other Standing Exercises  Bil UE 3 way raises with 2 lb on each wrist, 10 times each (flexion, scpation, and abduction to shoulder height)      Shoulder Exercises: Pulleys   Flexion  2 minutes    ABduction  2 minutes      Shoulder Exercises: Therapy Ball   Flexion  Both;10 reps 2 lbs on wrist; forward lean into end of stretch    ABduction  Left;10 reps 2 lbs on wrist; same side lean into end of stretch      Manual Therapy   Manual Therapy  Myofascial release;Passive ROM    Myofascial Release  To Lt axilla and upper arm during P/ROM  Passive ROM  In Supine to Lt shoulder into flexion, abduction and D2 slowly with slow, prolonged holds all to pts tolerance.                   PT Long Term Goals - 06/01/18 1406      PT LONG TERM GOAL #1   Time  4    Period  Weeks    Status  New      PT LONG TERM GOAL #2   Title  Pt. will show at least 145 degrees of left shoulder abduction to enable radiation positoning easily.    Time  4    Period  Weeks    Status  New      PT LONG TERM GOAL #3   Title  Pt. will show good understanding of lymphedema risk reduction practices.    Time  4    Period  Weeks    Status  New      PT LONG TERM GOAL #4   Title  Patient will report at least 75% decrease in discomfort overall in left UE.    Time  4    Period  Weeks    Status  New            Plan - 06/30/18 1019    Clinical Impression Statement  Pts A/ROM continues to steadily improve each  week. She has felt better since small flare up over weekend since our last visit on Tuesday. She reports her diarrhea has been bad the last 2 days and knows she needs to just stay on her antidiarrhea meds consistenly. She continues to be very pleased with her progress.     Rehab Potential  Excellent    PT Frequency  2x / week    PT Duration  4 weeks    PT Treatment/Interventions  ADLs/Self Care Home Management;Moist Heat;DME Instruction;Therapeutic exercise;Patient/family education;Orthotic Fit/Training;Manual techniques;Manual lymph drainage;Scar mobilization;Passive range of motion;Taping    PT Next Visit Plan  D/C next visit or renew? Cont left shoulder P/AA/A/ROM with stretching and mobilization of cording in left UE;     Consulted and Agree with Plan of Care  Patient       Patient will benefit from skilled therapeutic intervention in order to improve the following deficits and impairments:  Decreased range of motion, Pain, Impaired UE functional use  Visit Diagnosis: Stiffness of left shoulder, not elsewhere classified  Left shoulder pain, unspecified chronicity  Disorder of the skin and subcutaneous tissue related to radiation, unspecified     Problem List Patient Active Problem List   Diagnosis Date Noted  . 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 Breast cancer, left ([place]) 10/14/2017  . Port catheter in place 07/20/2017  . 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 Malignant (primary) neoplasm, unspecified ([place]) 04/26/2017  . 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[place]) 04/21/2017    Melissa [name], PTA 06/30/2018, 10:22 AM  [place], [place], [address] Phone: [phone]   Fax:  [phone]  Name: Melissa [name] MRN: [id number] Date of  Birth: [birth year]

## 2018-07-01 ENCOUNTER — Other Ambulatory Visit: Payer: Self-pay

## 2018-07-01 ENCOUNTER — Other Ambulatory Visit: Payer: Self-pay | Admitting: Hematology and Oncology

## 2018-07-01 NOTE — Progress Notes (Signed)
Updated med list due to duplicate listing of anastrozole on med list.

## 2018-07-05 ENCOUNTER — Ambulatory Visit: Payer: BLUE CROSS/BLUE SHIELD

## 2018-07-05 DIAGNOSIS — L599 Disorder of the skin and subcutaneous tissue related to radiation, unspecified: Secondary | ICD-10-CM

## 2018-07-05 DIAGNOSIS — M25612 Stiffness of left shoulder, not elsewhere classified: Secondary | ICD-10-CM

## 2018-07-05 DIAGNOSIS — M25512 Pain in left shoulder: Secondary | ICD-10-CM

## 2018-07-05 NOTE — Therapy (Signed)
[place], [place], [address] Phone: [phone]   Fax:  [phone]  Physical Therapy Treatment  Patient Details  Name: Melissa [name] MRN: [id number] Date of Birth: [birth year] Referring Provider: Dr. Rolm Bookbinder   Encounter Date: 07/05/2018  PT End of Session - 07/05/18 1218    Visit Number  9    Number of Visits  9    Date for PT Re-Evaluation  07/06/18    PT Start Time  1104    PT Stop Time  1150    PT Time Calculation (min)  46 min    Activity Tolerance  Patient tolerated treatment well    Behavior During Therapy  [facility] for tasks assessed/performed       Past Medical History:  Diagnosis Date  . Breast cancer ([place]) 04/2017   left breast  . Cervical cancer ([place])    1990, treated with hysterectomy only  . GERD (gastroesophageal reflux disease)   . History of radiation therapy 12/09/17- 01/12/18   Left Breast treated to 50 Gy with 25 fx of 2 Gy  . Hypercholesteremia   . Hypertension     Past Surgical History:  Procedure Laterality Date  . ABDOMINAL HYSTERECTOMY  1990  . AXILLARY LYMPH NODE DISSECTION Left 10/14/2017   Procedure: LEFT AXILLARY LYMPH NODE DISSECTION;  Surgeon: Rolm Bookbinder, MD;  Location: [place];  Service: General;  Laterality: Left;  . EYE SURGERY     eye lift  . PORTACATH PLACEMENT Right 05/12/2017   Procedure: INSERTION PORT-A-CATH WITH ULTRASOUND;  Surgeon: Rolm Bookbinder, MD;  Location: [place];  Service: General;  Laterality: Right;    There were no vitals filed for this visit.  Subjective Assessment - 07/05/18 1113    Subjective  I've gotten myself more consistent on my antidiarrheal meds so I upped the [name] to 6 pills which is the max amount. So once I am on the dose for about 2 months my body is supposed to adjust and I won't need the anitdiarrheal meds.     Pertinent History   Left breast cancer metastasized to  axilla with 13 lymph nodes removed 10/14/17. No tumor was found in breast. Finished chemo 08/11/17 with maintenance through May 10, 2018. Radiation completed 01/11/18. On Arimidex and starting another chemo pill. Had HTN but is weaning off low dose of meds now. Hyperlipidemia.     Patient Stated Goals  Improve shoulder ROM by quite a bit.    Currently in Pain?  No/denies                                    PT Long Term Goals - 06/01/18 1406      PT LONG TERM GOAL #1   Time  4    Period  Weeks    Status  New      PT LONG TERM GOAL #2   Title  Pt. will show at least 145 degrees of left shoulder abduction to enable radiation positoning easily.    Time  4    Period  Weeks    Status  New      PT LONG TERM GOAL #3   Title  Pt. will show good understanding of lymphedema risk reduction practices.    Time  4    Period  Weeks    Status  New  PT LONG TERM GOAL #4   Title  Patient will report at least 75% decrease in discomfort overall in left UE.    Time  4    Period  Weeks    Status  New            Plan - 07/05/18 1202    Clinical Impression Statement  Continued with focus on manual therapy for end ROM to Lt shoulder. Pts end ROM though has shown improvement is still limited by tissue tightness. She has 2 more visits until D/C and will benfit from focusing on end ROM stretching of Lt shoulder joint and surrounding tight soft tissue.     Rehab Potential  Excellent    PT Frequency  2x / week    PT Duration  4 weeks    PT Treatment/Interventions  ADLs/Self Care Home Management;Moist Heat;DME Instruction;Therapeutic exercise;Patient/family education;Orthotic Fit/Training;Manual techniques;Manual lymph drainage;Scar mobilization;Passive range of motion;Taping    PT Next Visit Plan  Focus on manual therapy for end Lt shoulder ROM stretching, including scapular mobs. Pt has 2 more visits then D/C.    Consulted and Agree with Plan of Care  Patient        Patient will benefit from skilled therapeutic intervention in order to improve the following deficits and impairments:  Decreased range of motion, Pain, Impaired UE functional use  Visit Diagnosis: Stiffness of left shoulder, not elsewhere classified  Left shoulder pain, unspecified chronicity  Disorder of the skin and subcutaneous tissue related to radiation, unspecified     Problem List Patient Active Problem List   Diagnosis Date Noted  . 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 Breast cancer, left ([place]) 10/14/2017  . Port catheter in place 07/20/2017  . 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 Malignant (primary) neoplasm, unspecified ([place]) 04/26/2017  . 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[place]) 04/21/2017    Melissa [name], PTA 07/05/2018, 12:19 PM  [place], [place], [address] Phone: [phone]   Fax:  [phone]  Name: Melissa [name] MRN: [id number] Date of Birth: [birth year]

## 2018-07-07 ENCOUNTER — Ambulatory Visit: Payer: BLUE CROSS/BLUE SHIELD | Attending: General Surgery

## 2018-07-07 DIAGNOSIS — L599 Disorder of the skin and subcutaneous tissue related to radiation, unspecified: Secondary | ICD-10-CM | POA: Diagnosis not present

## 2018-07-07 DIAGNOSIS — M25612 Stiffness of left shoulder, not elsewhere classified: Secondary | ICD-10-CM | POA: Diagnosis not present

## 2018-07-07 DIAGNOSIS — M25512 Pain in left shoulder: Secondary | ICD-10-CM | POA: Diagnosis not present

## 2018-07-07 NOTE — Therapy (Signed)
[place], [place], [address] Phone: [phone]   Fax:  [phone]  Physical Therapy Treatment  Patient Details  Name: Melissa [name] MRN: [id number] Date of Birth: May 31, 1961 Referring Provider: Dr. Rolm Bookbinder   Encounter Date: 07/07/2018  PT End of Session - 07/07/18 1224    Visit Number  10    Number of Visits  10    Date for PT Re-Evaluation  07/06/18    PT Start Time  1104    PT Stop Time  1149    PT Time Calculation (min)  45 min    Activity Tolerance  Patient tolerated treatment well    Behavior During Therapy  [facility] for tasks assessed/performed       Past Medical History:  Diagnosis Date  . Breast cancer ([place]) 04/2017   left breast  . Cervical cancer ([place])    1990, treated with hysterectomy only  . GERD (gastroesophageal reflux disease)   . History of radiation therapy 12/09/17- 01/12/18   Left Breast treated to 50 Gy with 25 fx of 2 Gy  . Hypercholesteremia   . Hypertension     Past Surgical History:  Procedure Laterality Date  . ABDOMINAL HYSTERECTOMY  1990  . AXILLARY LYMPH NODE DISSECTION Left 10/14/2017   Procedure: LEFT AXILLARY LYMPH NODE DISSECTION;  Surgeon: Rolm Bookbinder, MD;  Location: [place];  Service: General;  Laterality: Left;  . EYE SURGERY     eye lift  . PORTACATH PLACEMENT Right 05/12/2017   Procedure: INSERTION PORT-A-CATH WITH ULTRASOUND;  Surgeon: Rolm Bookbinder, MD;  Location: [place];  Service: General;  Laterality: Right;    There were no vitals filed for this visit.  Subjective Assessment - 07/07/18 1107    Subjective  Had a rough afternoon yesterday with diarrhea because my main diarrheal med was on backordered. But they got it in later last night and after a rough night I'm doing better this morning.    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 No  tumor was found in breast. Finished chemo 08/11/17 with maintenance through May 10, 2018. Radiation completed 01/11/18. On Arimidex and starting another chemo pill. Had HTN but is weaning off low dose of meds now. Hyperlipidemia.     Patient Stated Goals  Improve shoulder ROM by quite a bit.    Currently in Pain?  No/denies                       [facility] Adult PT Treatment/Exercise - 07/07/18 0001      Manual Therapy   Manual Therapy  Myofascial release;Passive ROM;Scapular mobilization    Myofascial Release  To Lt axilla and upper arm during P/ROM    Scapular Mobilization  In Rt S/L for scapular mobs in all direction but focused on protraction with pts hand over her Lt glut in IR; also with P/ROM of Lt shoulder into abduction with depression of scapula    Passive ROM  In Supine to Lt shoulder into flexion, abduction and D2 to pts tolerance, then Rt S/L (see above) then finished in supine                  PT Long Term Goals - 07/07/18 1225      PT LONG TERM GOAL #1   Title  Pt. will have attained at least 135 degrees of left shoulder flexion  for improved overhead reach.    Baseline  144 degrees-06/30/18    Status  Achieved      PT LONG TERM GOAL #2   Title  Pt. will show at least 145 degrees of left shoulder abduction to enable radiation positoning easily.    Baseline  156 degrees-06/30/18    Status  Achieved      PT LONG TERM GOAL #3   Title  Pt. will show good understanding of lymphedema risk reduction practices.    Status  On-going      PT LONG TERM GOAL #4   Title  Patient will report at least 75% decrease in discomfort overall in left UE.    Baseline  Pt did not rate today but reports this much improved-07/07/18    Status  Partially Met            Plan - 07/07/18 1229    Clinical Impression Statement  Focused on manual therapy today including scapular mobs to help decrease end ROM tightness. Bye end of session her P/ROM was improved and pt reports  feeling less tight. She would like to come 1 more time next week for more focus on end ROM stretching so renewal done today for todays visit and next week .    Rehab Potential  Excellent    PT Frequency  2x / week    PT Duration  4 weeks    PT Treatment/Interventions  ADLs/Self Care Home Management;Moist Heat;DME Instruction;Therapeutic exercise;Patient/family education;Orthotic Fit/Training;Manual techniques;Manual lymph drainage;Scar mobilization;Passive range of motion;Taping    PT Next Visit Plan  Renewal done today, but has 1 more visit next week then going out of town to stay with her daughter after delivering pts grandson. Cont to focus on end ROM and try trigger point release/soft tissue along periscapular border.      Consulted and Agree with Plan of Care  Patient       Patient will benefit from skilled therapeutic intervention in order to improve the following deficits and impairments:  Decreased range of motion, Pain, Impaired UE functional use  Visit Diagnosis: Stiffness of left shoulder, not elsewhere classified  Left shoulder pain, unspecified chronicity  Disorder of the skin and subcutaneous tissue related to radiation, unspecified     Problem List Patient Active Problem List   Diagnosis Date Noted  . 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 Breast cancer, left ([place]) 10/14/2017  . Port catheter in place 07/20/2017  . 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 Malignant (primary) neoplasm, unspecified ([place]) 04/26/2017  . 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[place]) 04/21/2017    [name], PTA 07/07/2018, 12:33 PM  [place], [place], [address] Phone: [phone]   Fax:  [phone]  Name: Melissa [name] MRN: [id number] Date of  Birth: 1960-12-18

## 2018-07-07 NOTE — Addendum Note (Signed)
Addended by: Kipp Laurence on: 07/07/2018 04:44 PM   Modules accepted: Orders

## 2018-07-12 ENCOUNTER — Ambulatory Visit: Payer: BLUE CROSS/BLUE SHIELD

## 2018-07-12 ENCOUNTER — Other Ambulatory Visit: Payer: Self-pay | Admitting: *Deleted

## 2018-07-12 DIAGNOSIS — L599 Disorder of the skin and subcutaneous tissue related to radiation, unspecified: Secondary | ICD-10-CM

## 2018-07-12 DIAGNOSIS — M25512 Pain in left shoulder: Secondary | ICD-10-CM | POA: Diagnosis not present

## 2018-07-12 DIAGNOSIS — M25612 Stiffness of left shoulder, not elsewhere classified: Secondary | ICD-10-CM | POA: Diagnosis not present

## 2018-07-12 MED ORDER — SULFAMETHOXAZOLE-TRIMETHOPRIM 800-160 MG PO TABS: 1.0000 | ORAL_TABLET | Freq: Two times a day (BID) | ORAL | 0 refills | Status: DC

## 2018-07-12 NOTE — Therapy (Addendum)
[place], [place], [address] Phone: [phone]   Fax:  [phone]  Physical Therapy Treatment  Patient Details  Name: KATENA PETITJEAN MRN: [id number] Date of Birth: February 01, 1961 Referring Provider: Dr. Donne Hazel   Encounter Date: 07/12/2018  PT End of Session - 07/12/18 1611    Visit Number  11    Number of Visits  14    Date for PT Re-Evaluation  08/07/18    PT Start Time  1518    PT Stop Time  1605    PT Time Calculation (min)  47 min    Activity Tolerance  Patient tolerated treatment well    Behavior During Therapy  [facility] for tasks assessed/performed       Past Medical History:  Diagnosis Date  . Breast cancer ([place]) 04/2017   left breast  . Cervical cancer ([place])    1990, treated with hysterectomy only  . GERD (gastroesophageal reflux disease)   . History of radiation therapy 12/09/17- 01/12/18   Left Breast treated to 50 Gy with 25 fx of 2 Gy  . Hypercholesteremia   . Hypertension     Past Surgical History:  Procedure Laterality Date  . ABDOMINAL HYSTERECTOMY  1990  . AXILLARY LYMPH NODE DISSECTION Left 10/14/2017   Procedure: LEFT AXILLARY LYMPH NODE DISSECTION;  Surgeon: Rolm Bookbinder, MD;  Location: [place];  Service: General;  Laterality: Left;  . EYE SURGERY     eye lift  . PORTACATH PLACEMENT Right 05/12/2017   Procedure: INSERTION PORT-A-CATH WITH ULTRASOUND;  Surgeon: Rolm Bookbinder, MD;  Location: [place];  Service: General;  Laterality: Right;    There were no vitals filed for this visit.  Subjective Assessment - 07/12/18 1531    Subjective  I'm ready to meet my first grandchild! My daughter is being induced tomorrow. My Lt shoulder is feeling great.     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 No tumor was found in breast. Finished chemo 08/11/17 with maintenance through May 10, 2018. Radiation completed 01/11/18. On Arimidex and starting another chemo pill. Had HTN but is weaning off low dose of meds now. Hyperlipidemia.     Patient Stated Goals  Improve shoulder ROM by quite a bit.    Currently in Pain?  No/denies         [name] PT Assessment - 07/12/18 0001      AROM   Left Shoulder Flexion  150 Degrees    Left Shoulder ABduction  169 Degrees                   [facility] Adult PT Treatment/Exercise - 07/12/18 0001      Shoulder Exercises: Standing   Other Standing Exercises  Bil UE 3 way raises with 2 lbs, 2x10 times each (flexion, scpation, and abduction to shoulder height) with back against wall      Shoulder Exercises: Pulleys   Flexion  2 minutes 2 lbs on Lt wrist    ABduction  2 minutes 2 lbs on Lt wrist      Shoulder Exercises: Therapy Ball   Flexion  Both;10 reps 2 lbs on Lt wrist; forward lean into end of stretch    ABduction  Left;10 reps 2 lbs on wrist:same side lean into end of stretch      Manual Therapy   Manual Therapy  Myofascial release;Passive ROM    Myofascial  Release  To Lt axilla during P/ROM    Passive ROM  In Supine to Lt shoulder into flexion and abduction to pts tolerance             PT Education - 07/12/18 1610    Education Details  Reviewed lymphedema risk reductions and infection prevention    Person(s) Educated  Patient    Methods  Explanation;Handout    Comprehension  Verbalized understanding          PT Long Term Goals - 07/12/18 1542      PT LONG TERM GOAL #1   Title  Pt. will have attained at least 135 degrees of left shoulder flexion for improved overhead reach.    Baseline  144 degrees-06/30/18; 150 degrees-07/12/18    Status  Achieved      PT LONG TERM GOAL #2   Title  Pt. will show at least 145 degrees of left shoulder abduction to enable radiation positoning easily.    Baseline  156 degrees-06/30/18; 169 degrees - 07/12/18    Status  Achieved      PT LONG TERM GOAL #3   Title  Pt. will show  good understanding of lymphedema risk reduction practices.    Baseline  Pt has attended ABC class and reviewed with her today-07/12/18    Status  Achieved      PT LONG TERM GOAL #4   Title  Patient will report at least 75% decrease in discomfort overall in left UE.    Baseline  Pt did not rate today but reports this much improved-07/07/18; pt reports 100%-07/12/18    Status  Achieved            Plan - 07/12/18 1612    Clinical Impression Statement  Pt has done very well with therapy and has met all goals. She is ready for D/C at this time.     Rehab Potential  Excellent    PT Frequency  2x / week    PT Duration  4 weeks    PT Treatment/Interventions  ADLs/Self Care Home Management;Moist Heat;DME Instruction;Therapeutic exercise;Patient/family education;Orthotic Fit/Training;Manual techniques;Manual lymph drainage;Scar mobilization;Passive range of motion;Taping    PT Next Visit Plan  D/C this visit.     Consulted and Agree with Plan of Care  Patient       Patient will benefit from skilled therapeutic intervention in order to improve the following deficits and impairments:  Decreased range of motion, Pain, Impaired UE functional use  Visit Diagnosis: Stiffness of left shoulder, not elsewhere classified  Left shoulder pain, unspecified chronicity  Disorder of the skin and subcutaneous tissue related to radiation, unspecified     Problem List Patient Active Problem List   Diagnosis Date Noted  . 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 Breast cancer, left ([place]) 10/14/2017  . Port catheter in place 07/20/2017  . 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 Malignant (primary) neoplasm, unspecified ([place]) 04/26/2017  . 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[place]) 04/21/2017    [name], PTA 07/12/2018, 4:14 PM  [place], [place], [address] Phone: [phone]   Fax:  [phone]  Name: APRYLL HINKLE MRN: [id number] Date of Birth: 1961/02/04  PHYSICAL THERAPY DISCHARGE SUMMARY  Visits from Start of Care: 11  Current functional level related to goals / functional outcomes: As  above   Remaining deficits: none   Education / Equipment: Home exercise, lymphedema risk reduction  Plan: Patient agrees to discharge.  Patient goals were met. Patient is being discharged due to being pleased with the current functional level.  ?????    Maudry Diego, PT 07/13/18 11:08 AM

## 2018-07-12 NOTE — Progress Notes (Signed)
Received call from patient stating she sure she has a UTI.  She has all the symptoms, frequency, burning, pain.  She is leaving today to go be with her daughter for 2 weeks who is being induced tomorrow.  Per Dr. Lindi Adie call in Bactrim for her. Informed patient.

## 2018-07-22 ENCOUNTER — Other Ambulatory Visit: Payer: Self-pay | Admitting: Hematology and Oncology

## 2018-07-22 ENCOUNTER — Other Ambulatory Visit: Payer: Self-pay

## 2018-07-22 ENCOUNTER — Telehealth: Payer: Self-pay

## 2018-07-22 DIAGNOSIS — C801 Malignant (primary) neoplasm, unspecified: Secondary | ICD-10-CM

## 2018-07-22 MED ORDER — LOPERAMIDE HCL 2 MG PO CAPS: 4.0000 mg | ORAL_CAPSULE | Freq: Three times a day (TID) | ORAL | 0 refills | Status: DC | PRN

## 2018-07-22 NOTE — Telephone Encounter (Signed)
Outgoing call to patient regarding recent refill request from [facility] for Imodium. Per them, she got 90 tabs on June 5, a refill  on July 14th, and a refill on July 29th.  Called pt, she reports she is on week 6 of her [name] and said that Dr Lindi Adie said she could take up to 8 Imodium a day and she is.  She reports last week was the first week that her diarrhea was under control with Imodium.    She is now having 2 soft to firm BMs a day, no diarrhea, no constipation.  Pt was in good sprits and said she has a new grand daughter so she has been spending time with her.  She requested one more refill of Imodium but she is planning next week to wean herself "some" off the Imodium.  Told her to start slowly, as in one less Imodium pill every few days.  Pt voiced understanding.    She is still taking a dose of Lomotil at night and has some Budesonide left. She was concerned about how long she could actually take Lomotil.  Told pt to call our office if she sees that she unable to wean, having to take max amount or needs anymore refills.  Has appt in October but she will let us know if she needs to be seen sooner.  No other needs per pt. I touched base with Johny Drilling -pharmacist regarding [name] also.  Routed note to Dr Lindi Adie.

## 2018-07-27 ENCOUNTER — Telehealth: Payer: Self-pay

## 2018-07-27 ENCOUNTER — Other Ambulatory Visit: Payer: Self-pay | Admitting: Hematology and Oncology

## 2018-07-27 NOTE — Telephone Encounter (Signed)
Pt called stating she would like an appt with Dr Lindi Adie to discuss plan of care and "where do we go from here now that I am on this new drug".  Appt has been made for 8/28 at 2pm

## 2018-08-03 ENCOUNTER — Inpatient Hospital Stay: Payer: BLUE CROSS/BLUE SHIELD | Attending: Hematology and Oncology | Admitting: Hematology and Oncology

## 2018-08-03 DIAGNOSIS — C773 Secondary and unspecified malignant neoplasm of axilla and upper limb lymph nodes: Secondary | ICD-10-CM | POA: Insufficient documentation

## 2018-08-03 DIAGNOSIS — C801 Malignant (primary) neoplasm, unspecified: Secondary | ICD-10-CM

## 2018-08-03 DIAGNOSIS — Z79811 Long term (current) use of aromatase inhibitors: Secondary | ICD-10-CM | POA: Insufficient documentation

## 2018-08-03 DIAGNOSIS — Z170 Estrogen receptor positive status [ER+]: Secondary | ICD-10-CM | POA: Diagnosis not present

## 2018-08-03 DIAGNOSIS — R197 Diarrhea, unspecified: Secondary | ICD-10-CM

## 2018-08-03 DIAGNOSIS — Z923 Personal history of irradiation: Secondary | ICD-10-CM | POA: Diagnosis not present

## 2018-08-03 MED ORDER — LOPERAMIDE HCL 2 MG PO CAPS: 4.0000 mg | ORAL_CAPSULE | Freq: Three times a day (TID) | ORAL | 6 refills | Status: DC | PRN

## 2018-08-03 MED ORDER — DIPHENOXYLATE-ATROPINE 2.5-0.025 MG PO TABS: 2.0000 | ORAL_TABLET | Freq: Four times a day (QID) | ORAL | 6 refills | Status: DC | PRN

## 2018-08-03 NOTE — Assessment & Plan Note (Signed)
04/14/2017 Left axillary lymph node biopsy: Metastatic carcinoma positive for CK 7, ER positive,GATA-3 equivocal, negative for CK 20,GCDFP, CDX-2, TTF-1; HER-2 positive ratio 2.08 mammogram and ultrasound revealed 3 discrete abnormal left axillary lymph nodes largest measures 1.8 cm Clinical stage: TX N1MX  Treatment plan: 1. Neoadjuvant TCH Perjeta 6 cycles completed 08/31/2017 followed by Herceptin, Perjeta maintenance for 1 year 2. followed by axillary lymph node dissection. Breast surgery will not be performed because there is no primary tumor identifiable on breast MRI: 10/14/17: 0/13 LN Negative 3. Followed by radiation 12/08/2017-01/12/2018 4. Followed by antiestrogen therapy started 01/30/2018 5. Herceptin Perjeta maintenance completed 05/10/2018 6. Neratinib started 05/13/2018 ------------------------------------------------------------------------------------------------------------------ Current Treatment:Anastrozole with Neratinib  Neratinib toxicities:  1. Diarrhea especially at night   she completed budesonide therapy.  She is currently on Imodium and Lomotil.  She takes up to 4 Imodium's and 2 Lomotil 70-day.  This is been controlling her diarrhea.  I renewed her prescriptions for both of these medications today.  Letrozole toxicities: Occasional stiffness as well as hot flashes but not worrying her significantly.  Return to clinic in 2 months to follow-up 

## 2018-08-03 NOTE — Progress Notes (Signed)
Patient Care Team: Deland Pretty, MD as PCP - General (Internal Medicine)  DIAGNOSIS:  Encounter Diagnoses  Name Primary?  . Malignant (primary) neoplasm, unspecified ([place])   . 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[place])     SUMMARY OF ONCOLOGIC HISTORY:   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[place])   04/14/2017 Initial Diagnosis    Left axillary lymph node biopsy: Metastatic carcinoma positive for CK 7, ER positive,GATA-3 equivocal, negative for CK 20,GCDFP, CDX-2, TTF-1; HER-2 positive ratio 2.08; mammogram and ultrasound revealed 3 discrete abnormal left axillary lymph nodes largest measures 1.8 cm    04/23/2017 Breast MRI    Left axillary lymphadenopathy numerous enlarged level I axillary lymph nodes largest 2.1 cm, no other abdominal masses in the left breast itself, no MRI evidence of malignancy in the right breast    04/26/2017 Imaging    CT CAP: Prominent left axillary lymph nodes no distant metastases, sclerotic focus left pedicle of T9 vertebra favored benign, T5 vertebral hemangioma    05/12/2017 - 08/31/2017 Neo-Adjuvant Chemotherapy    TCH Perjeta x6 followed by Herceptin Perjeta maintenance for 1 year    10/14/2017 Surgery    Left axillary lymph node dissection: 0/13 lymph nodes complete pathologic response.  Breast was not operated on because no breast primary was ever identified    12/09/2017 - 01/11/2018 Radiation Therapy    Adjuvant radiation therapy    01/30/2018 -  Anti-estrogen oral therapy    Anastrozole 1 mg daily, Neratinib started 05/13/2018     CHIEF COMPLIANT: Follow-up on anastrozole and Neratinib  INTERVAL HISTORY: Melissa [name] is a 57 year old with above-mentioned history of ER positive HER-2 positive breast cancer who is currently on anastrozole with Neratinib.  She has been on Neratinib for the past 2 months and has had diarrhea issues.  She is currently on 4 Imodium  sent to Lomotil's every day and this appears to be handling her diarrhea extremely well.  She however feels the budesonide was very helpful.  However her insurance will not cover this.  Denies any abdominal pain or cramps.  She is tolerating anastrozole reasonably well with occasional muscle stiffness and hot flashes.  REVIEW OF SYSTEMS:   Constitutional: Denies fevers, chills or abnormal weight loss Eyes: Denies blurriness of vision Ears, nose, mouth, throat, and face: Denies mucositis or sore throat Respiratory: Denies cough, dyspnea or wheezes Cardiovascular: Denies palpitation, chest discomfort Gastrointestinal: Diarrhea Skin: Denies abnormal skin rashes Lymphatics: Denies new lymphadenopathy or easy bruising Neurological:Denies numbness, tingling or new weaknesses Behavioral/Psych: Mood is stable, no new changes  Extremities: No lower extremity edema  All other systems were reviewed with the patient and are negative.  I have reviewed the past medical history, past surgical history, social history and family history with the patient and they are unchanged from previous note.  ALLERGIES:  has No Known Allergies.  MEDICATIONS:  Current Outpatient Medications  Medication Sig Dispense Refill  . ALPRAZolam (XANAX) 0.5 MG tablet Take 0.5 mg by mouth 3 (three) times daily as needed for anxiety.   0  . anastrozole (ARIMIDEX) 1 MG tablet TAKE 1 TABLET(1 MG) BY MOUTH DAILY 90 tablet 0  . Budesonide ER (UCERIS) 9 MG TB24 Take 9 mg by mouth daily. 30 tablet 2  . calcium carbonate (OS-CAL) 1250 (500 Ca) MG chewable tablet Chew 1 tablet by mouth daily. Calcium 600 mg    . cholecalciferol (VITAMIN D) 1000  units tablet Take 1,000 Units by mouth daily. 2000 iu    . diphenoxylate-atropine (LOMOTIL) 2.5-0.025 MG tablet Take 2 tablets by mouth 4 (four) times daily as needed for diarrhea or loose stools. 120 tablet 6  . levocetirizine (XYZAL) 5 MG tablet Take 5 mg by mouth every evening.    . loperamide  (IMODIUM) 2 MG capsule Take 2 capsules (4 mg total) by mouth 3 (three) times daily as needed for diarrhea or loose stools. 120 capsule 6  . Neratinib Maleate ([name]) 40 MG tablet Take 6 tablets (240 mg total) by mouth daily. Take with food. 180 tablet 3  . rosuvastatin (CRESTOR) 10 MG tablet Take 10 mg by mouth daily.   0  . triamterene-hydrochlorothiazide (MAXZIDE-25) 37.5-25 MG tablet Take 1 tablet by mouth daily.    . Turmeric 500 MG TABS Take by mouth.    . zolpidem (AMBIEN) 5 MG tablet TAKE 1 TABLET BY MOUTH AT BEDTIME AS NEEDED FOR SLEEP 90 tablet 3   No current facility-administered medications for this visit.     PHYSICAL EXAMINATION: ECOG PERFORMANCE STATUS: 1 - Symptomatic but completely ambulatory  Vitals:   08/03/18 1418  BP: (!) 155/73  Pulse: 70  Resp: 18  Temp: 98.3 F (36.8 C)  SpO2: 100%   Filed Weights   08/03/18 1418  Weight: 140 lb 11.2 oz (63.8 kg)    GENERAL:alert, no distress and comfortable SKIN: skin color, texture, turgor are normal, no rashes or significant lesions EYES: normal, Conjunctiva are pink and non-injected, sclera clear OROPHARYNX:no exudate, no erythema and lips, buccal mucosa, and tongue normal  NECK: supple, thyroid normal size, non-tender, without nodularity LYMPH:  no palpable lymphadenopathy in the cervical, axillary or inguinal LUNGS: clear to auscultation and percussion with normal breathing effort HEART: regular rate & rhythm and no murmurs and no lower extremity edema ABDOMEN:abdomen soft, non-tender and normal bowel sounds MUSCULOSKELETAL:no cyanosis of digits and no clubbing  NEURO: alert & oriented x 3 with fluent speech, no focal motor/sensory deficits EXTREMITIES: No lower extremity edema   LABORATORY DATA:  I have reviewed the data as listed CMP Latest Ref Rng & Units 06/20/2018 05/10/2018 04/19/2018  Glucose 70 - 99 mg/dL 90 86 108  BUN 6 - 20 mg/dL 22(H) 21 19  Creatinine 0.44 - 1.00 mg/dL 0.99 0.84 0.86  Sodium 135 -  145 mmol/L 140 138 140  Potassium 3.5 - 5.1 mmol/L 3.2(L) 3.9 3.3(L)  Chloride 98 - 111 mmol/L 102 101 101  CO2 22 - 32 mmol/L 28 28 30(H)  Calcium 8.9 - 10.3 mg/dL 10.2 10.0 10.0  Total Protein 6.5 - 8.1 g/dL 7.6 7.4 7.0  Total Bilirubin 0.3 - 1.2 mg/dL 0.6 0.2 0.3  Alkaline Phos 38 - 126 U/L 92 89 80  AST 15 - 41 U/L _0 ALT 0 - 44 U/L _1 Lab Results  Component Value Date   WBC 8.1 06/20/2018   HGB 12.9 06/20/2018   HCT 38.5 06/20/2018   MCV 90.1 06/20/2018   PLT 241 06/20/2018   NEUTROABS 6.2 06/20/2018    ASSESSMENT & PLAN:  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  Neoadjuvant TCH Perjeta 6 cycles completed 08/31/2017 followed by Herceptin, Perjeta maintenance for 1 year 2. followed by axillary lymph node dissection. Breast surgery will not be performed because there is no primary tumor identifiable on breast MRI: 10/14/17: 0/13 LN Negative 3. Followed by radiation 12/08/2017-01/12/2018 4. Followed by antiestrogen therapy started 01/30/2018 5. Herceptin Perjeta maintenance completed 05/10/2018 6. Neratinib started 05/13/2018 ------------------------------------------------------------------------------------------------------------------ Current Treatment:Anastrozole with Neratinib  Neratinib toxicities:  1. Diarrhea especially at night   she completed budesonide therapy.  She is currently on Imodium and Lomotil.  She takes up to 4 Imodium's and 2 Lomotil 70-day.  This is been controlling her diarrhea.  I renewed her prescriptions for both of these medications today.  Letrozole toxicities:  Occasional stiffness as well as hot flashes but not worrying her significantly.  Return to clinic in 2 months to follow-up    No orders of the defined types were placed in this encounter.  The patient has a good understanding of the overall plan. she agrees with it. she will call with any problems that may develop before the next visit here.   Harriette Ohara, MD 08/03/18

## 2018-08-04 ENCOUNTER — Telehealth: Payer: Self-pay | Admitting: Hematology and Oncology

## 2018-08-04 NOTE — Telephone Encounter (Signed)
Scheduled appt per 8/28 sch message - pt is aware of genetics appts./

## 2018-08-09 DIAGNOSIS — L03116 Cellulitis of left lower limb: Secondary | ICD-10-CM | POA: Diagnosis not present

## 2018-08-12 DIAGNOSIS — M79674 Pain in right toe(s): Secondary | ICD-10-CM | POA: Diagnosis not present

## 2018-08-12 DIAGNOSIS — M79675 Pain in left toe(s): Secondary | ICD-10-CM | POA: Diagnosis not present

## 2018-08-12 DIAGNOSIS — L03031 Cellulitis of right toe: Secondary | ICD-10-CM | POA: Diagnosis not present

## 2018-08-12 DIAGNOSIS — L02612 Cutaneous abscess of left foot: Secondary | ICD-10-CM | POA: Diagnosis not present

## 2018-08-16 ENCOUNTER — Other Ambulatory Visit: Payer: Self-pay | Admitting: *Deleted

## 2018-08-16 ENCOUNTER — Telehealth: Payer: Self-pay | Admitting: *Deleted

## 2018-08-16 ENCOUNTER — Inpatient Hospital Stay: Payer: BLUE CROSS/BLUE SHIELD

## 2018-08-16 ENCOUNTER — Inpatient Hospital Stay: Payer: BLUE CROSS/BLUE SHIELD | Attending: Hematology and Oncology

## 2018-08-16 VITALS — BP 134/77 | HR 77 | Temp 98.4°F | Resp 18

## 2018-08-16 DIAGNOSIS — Z803 Family history of malignant neoplasm of breast: Secondary | ICD-10-CM | POA: Diagnosis not present

## 2018-08-16 DIAGNOSIS — C773 Secondary and unspecified malignant neoplasm of axilla and upper limb lymph nodes: Secondary | ICD-10-CM | POA: Insufficient documentation

## 2018-08-16 DIAGNOSIS — C801 Malignant (primary) neoplasm, unspecified: Secondary | ICD-10-CM | POA: Insufficient documentation

## 2018-08-16 DIAGNOSIS — Z170 Estrogen receptor positive status [ER+]: Principal | ICD-10-CM

## 2018-08-16 DIAGNOSIS — C50612 Malignant neoplasm of axillary tail of left female breast: Secondary | ICD-10-CM

## 2018-08-16 DIAGNOSIS — Z79811 Long term (current) use of aromatase inhibitors: Secondary | ICD-10-CM | POA: Insufficient documentation

## 2018-08-16 DIAGNOSIS — C9002 Multiple myeloma in relapse: Secondary | ICD-10-CM

## 2018-08-16 LAB — CMP (CANCER CENTER ONLY)
ALK PHOS: 103 U/L (ref 38–126)
ALT: 21 U/L (ref 0–44)
ANION GAP: 11 (ref 5–15)
AST: 18 U/L (ref 15–41)
Albumin: 3.7 g/dL (ref 3.5–5.0)
BUN: 26 mg/dL — ABNORMAL HIGH (ref 6–20)
CALCIUM: 9.5 mg/dL (ref 8.9–10.3)
CO2: 20 mmol/L — AB (ref 22–32)
CREATININE: 1.01 mg/dL — AB (ref 0.44–1.00)
Chloride: 106 mmol/L (ref 98–111)
GFR, EST NON AFRICAN AMERICAN: 60 mL/min — AB (ref >60)
GFR, Est AFR Am: >60 mL/min (ref >60)
Glucose, Bld: 110 mg/dL — ABNORMAL HIGH (ref 70–99)
Potassium: 3.6 mmol/L (ref 3.5–5.1)
SODIUM: 137 mmol/L (ref 135–145)
Total Bilirubin: 0.7 mg/dL (ref 0.3–1.2)
Total Protein: 7 g/dL (ref 6.5–8.1)

## 2018-08-16 LAB — CBC WITH DIFFERENTIAL (CANCER CENTER ONLY)
Basophils Absolute: 0 10*3/uL (ref 0.0–0.1)
Basophils Relative: 0 %
Eosinophils Absolute: 0 10*3/uL (ref 0.0–0.5)
Eosinophils Relative: 0 %
HCT: 39.5 % (ref 34.8–46.6)
Hemoglobin: 13.3 g/dL (ref 11.6–15.9)
Lymphocytes Relative: 37 %
Lymphs Abs: 2.3 10*3/uL (ref 0.9–3.3)
MCH: 29.6 pg (ref 25.1–34.0)
MCHC: 33.7 g/dL (ref 31.5–36.0)
MCV: 87.8 fL (ref 79.5–101.0)
Monocytes Absolute: 0.4 10*3/uL (ref 0.1–0.9)
Monocytes Relative: 6 %
NEUTROS ABS: 3.5 10*3/uL (ref 1.5–6.5)
NEUTROS PCT: 57 %
PLATELETS: 294 10*3/uL (ref 145–400)
RBC: 4.5 MIL/uL (ref 3.70–5.45)
RDW: 13.2 % (ref 11.2–14.5)
WBC: 6.3 10*3/uL (ref 3.9–10.3)

## 2018-08-16 MED ORDER — SODIUM CHLORIDE 0.9 % IV SOLN
Freq: Once | INTRAVENOUS | Status: DC
  Filled 2018-08-16: qty 250

## 2018-08-16 MED ORDER — SODIUM CHLORIDE 0.9 % IV SOLN
INTRAVENOUS | Status: DC
  Administered 2018-08-16: 15:00:00 via INTRAVENOUS
  Filled 2018-08-16 (×2): qty 250

## 2018-08-16 NOTE — Patient Instructions (Signed)
Dehydration, Adult Dehydration is when there is not enough fluid or water in your body. This happens when you lose more fluids than you take in. Dehydration can range from mild to very bad. It should be treated right away to keep it from getting very bad. Symptoms of mild dehydration may include:  Thirst.  Dry lips.  Slightly dry mouth.  Dry, warm skin.  Dizziness. Symptoms of moderate dehydration may include:  Very dry mouth.  Muscle cramps.  Dark pee (urine). Pee may be the color of tea.  Your body making less pee.  Your eyes making fewer tears.  Heartbeat that is uneven or faster than normal (palpitations).  Headache.  Light-headedness, especially when you stand up from sitting.  Fainting (syncope). Symptoms of very bad dehydration may include:  Changes in skin, such as: ? Cold and clammy skin. ? Blotchy (mottled) or pale skin. ? Skin that does not quickly return to normal after being lightly pinched and let go (poor skin turgor).  Changes in body fluids, such as: ? Feeling very thirsty. ? Your eyes making fewer tears. ? Not sweating when body temperature is high, such as in hot weather. ? Your body making very little pee.  Changes in vital signs, such as: ? Weak pulse. ? Pulse that is more than 100 beats a minute when you are sitting still. ? Fast breathing. ? Low blood pressure.  Other changes, such as: ? Sunken eyes. ? Cold hands and feet. ? Confusion. ? Lack of energy (lethargy). ? Trouble waking up from sleep. ? Short-term weight loss. ? Unconsciousness. Follow these instructions at home:  If told by your doctor, drink an ORS: ? Make an ORS by using instructions on the package. ? Start by drinking small amounts, about  cup (120 mL) every 5-10 minutes. ? Slowly drink more until you have had the amount that your doctor said to have.  Drink enough clear fluid to keep your pee clear or pale yellow. If you were told to drink an ORS, finish the ORS  first, then start slowly drinking clear fluids. Drink fluids such as: ? Water. Do not drink only water by itself. Doing that can make the salt (sodium) level in your body get too low (hyponatremia). ? Ice chips. ? Fruit juice that you have added water to (diluted). ? Low-calorie sports drinks.  Avoid: ? Alcohol. ? Drinks that have a lot of sugar. These include high-calorie sports drinks, fruit juice that does not have water added, and soda. ? Caffeine. ? Foods that are greasy or have a lot of fat or sugar.  Take over-the-counter and prescription medicines only as told by your doctor.  Do not take salt tablets. Doing that can make the salt level in your body get too high (hypernatremia).  Eat foods that have minerals (electrolytes). Examples include bananas, oranges, potatoes, tomatoes, and spinach.  Keep all follow-up visits as told by your doctor. This is important. Contact a doctor if:  You have belly (abdominal) pain that: ? Gets worse. ? Stays in one area (localizes).  You have a rash.  You have a stiff neck.  You get angry or annoyed more easily than normal (irritability).  You are more sleepy than normal.  You have a harder time waking up than normal.  You feel: ? Weak. ? Dizzy. ? Very thirsty.  You have peed (urinated) only a small amount of very dark pee during 6-8 hours. Get help right away if:  You have symptoms of   very bad dehydration.  You cannot drink fluids without throwing up (vomiting).  Your symptoms get worse with treatment.  You have a fever.  You have a very bad headache.  You are throwing up or having watery poop (diarrhea) and it: ? Gets worse. ? Does not go away.  You have blood or something green (bile) in your throw-up.  You have blood in your poop (stool). This may cause poop to look black and tarry.  You have not peed in 6-8 hours.  You pass out (faint).  Your heart rate when you are sitting still is more than 100 beats a  minute.  You have trouble breathing. This information is not intended to replace advice given to you by your health care provider. Make sure you discuss any questions you have with your health care provider. Document Released: 09/19/2009 Document Revised: 06/12/2016 Document Reviewed: 01/17/2016 Elsevier Interactive Patient Education  2018 [employer] Inc.  

## 2018-08-16 NOTE — Telephone Encounter (Signed)
Received call from patient stating she has been having explosive diarrhea for past 6 days.  She was started on doxycycline for cellulitis from an infected bug bite by her primary care md. But after starting the doxycline she developed diarrhea.  She is also taking nerlynx and has not been having any problems.  Patient feels out of breath and weak.  She states when she eats immediately after she is having diarrhea. This is with taking 2 Lomotil and 4 Immodium.  Patient will come in today for labs and get IVF's.  Patient aware of appointments.

## 2018-08-18 ENCOUNTER — Telehealth: Payer: Self-pay | Admitting: Hematology and Oncology

## 2018-08-18 NOTE — Telephone Encounter (Signed)
Tried to reach regarding voicemail I did leave a message °

## 2018-08-25 DIAGNOSIS — L03032 Cellulitis of left toe: Secondary | ICD-10-CM | POA: Diagnosis not present

## 2018-09-01 ENCOUNTER — Inpatient Hospital Stay: Payer: BLUE CROSS/BLUE SHIELD

## 2018-09-01 ENCOUNTER — Inpatient Hospital Stay (HOSPITAL_BASED_OUTPATIENT_CLINIC_OR_DEPARTMENT_OTHER): Payer: BLUE CROSS/BLUE SHIELD | Admitting: Genetics

## 2018-09-01 DIAGNOSIS — Z7183 Encounter for nonprocreative genetic counseling: Secondary | ICD-10-CM

## 2018-09-01 DIAGNOSIS — C50612 Malignant neoplasm of axillary tail of left female breast: Secondary | ICD-10-CM

## 2018-09-01 DIAGNOSIS — C50912 Malignant neoplasm of unspecified site of left female breast: Secondary | ICD-10-CM

## 2018-09-01 DIAGNOSIS — C773 Secondary and unspecified malignant neoplasm of axilla and upper limb lymph nodes: Secondary | ICD-10-CM | POA: Diagnosis not present

## 2018-09-01 DIAGNOSIS — Z803 Family history of malignant neoplasm of breast: Secondary | ICD-10-CM | POA: Diagnosis not present

## 2018-09-01 DIAGNOSIS — L718 Other rosacea: Secondary | ICD-10-CM | POA: Diagnosis not present

## 2018-09-01 DIAGNOSIS — C801 Malignant (primary) neoplasm, unspecified: Secondary | ICD-10-CM

## 2018-09-01 DIAGNOSIS — L708 Other acne: Secondary | ICD-10-CM | POA: Diagnosis not present

## 2018-09-01 DIAGNOSIS — Z170 Estrogen receptor positive status [ER+]: Secondary | ICD-10-CM

## 2018-09-01 DIAGNOSIS — Z8541 Personal history of malignant neoplasm of cervix uteri: Secondary | ICD-10-CM | POA: Diagnosis not present

## 2018-09-01 DIAGNOSIS — Z8049 Family history of malignant neoplasm of other genital organs: Secondary | ICD-10-CM | POA: Diagnosis not present

## 2018-09-01 DIAGNOSIS — Z8042 Family history of malignant neoplasm of prostate: Secondary | ICD-10-CM | POA: Diagnosis not present

## 2018-09-01 DIAGNOSIS — Z853 Personal history of malignant neoplasm of breast: Secondary | ICD-10-CM | POA: Diagnosis not present

## 2018-09-01 DIAGNOSIS — Z79811 Long term (current) use of aromatase inhibitors: Secondary | ICD-10-CM | POA: Diagnosis not present

## 2018-09-01 LAB — CMP (CANCER CENTER ONLY)
ALBUMIN: 3.7 g/dL (ref 3.5–5.0)
ALK PHOS: 92 U/L (ref 38–126)
ALT: 21 U/L (ref 0–44)
AST: 19 U/L (ref 15–41)
Anion gap: 7 (ref 5–15)
BUN: 17 mg/dL (ref 6–20)
CALCIUM: 9.1 mg/dL (ref 8.9–10.3)
CO2: 29 mmol/L (ref 22–32)
CREATININE: 1.09 mg/dL — AB (ref 0.44–1.00)
Chloride: 103 mmol/L (ref 98–111)
GFR, Est AFR Am: >60 mL/min (ref >60)
GFR, Estimated: 54 mL/min — ABNORMAL LOW (ref >60)
GLUCOSE: 105 mg/dL — AB (ref 70–99)
Potassium: 3.7 mmol/L (ref 3.5–5.1)
SODIUM: 139 mmol/L (ref 135–145)
Total Bilirubin: 0.4 mg/dL (ref 0.3–1.2)
Total Protein: 6.7 g/dL (ref 6.5–8.1)

## 2018-09-01 LAB — CBC WITH DIFFERENTIAL (CANCER CENTER ONLY)
BASOS PCT: 0 %
Basophils Absolute: 0 10*3/uL (ref 0.0–0.1)
EOS ABS: 0 10*3/uL (ref 0.0–0.5)
Eosinophils Relative: 0 %
HCT: 34.6 % — ABNORMAL LOW (ref 34.8–46.6)
Hemoglobin: 11.4 g/dL — ABNORMAL LOW (ref 11.6–15.9)
LYMPHS ABS: 1.4 10*3/uL (ref 0.9–3.3)
Lymphocytes Relative: 28 %
MCH: 29.9 pg (ref 25.1–34.0)
MCHC: 33 g/dL (ref 31.5–36.0)
MCV: 90.7 fL (ref 79.5–101.0)
MONO ABS: 0.5 10*3/uL (ref 0.1–0.9)
Monocytes Relative: 10 %
Neutro Abs: 3.1 10*3/uL (ref 1.5–6.5)
Neutrophils Relative %: 62 %
Platelet Count: 267 10*3/uL (ref 145–400)
RBC: 3.81 MIL/uL (ref 3.70–5.45)
RDW: 13.2 % (ref 11.2–14.5)
WBC Count: 4.9 10*3/uL (ref 3.9–10.3)

## 2018-09-02 ENCOUNTER — Encounter: Payer: Self-pay | Admitting: Genetics

## 2018-09-02 DIAGNOSIS — Z803 Family history of malignant neoplasm of breast: Secondary | ICD-10-CM | POA: Insufficient documentation

## 2018-09-02 DIAGNOSIS — Z8042 Family history of malignant neoplasm of prostate: Secondary | ICD-10-CM | POA: Insufficient documentation

## 2018-09-02 NOTE — Progress Notes (Signed)
REFERRING PROVIDER: Nicholas Lose, MD [place], [place] [id number]  PRIMARY PROVIDER:  Deland Pretty, MD  PRIMARY REASON FOR VISIT:  1. 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, estrogen receptor positive (Melissa [name])   2. Family history of breast cancer   3. Family history of prostate cancer   4. Malignant (primary) neoplasm, unspecified ([name])   5. Malignant neoplasm of left female breast, unspecified estrogen receptor status, unspecified site of breast ([name])   6. Malignant neoplasm of axillary tail of left female breast, unspecified estrogen receptor status ([name])   7. Secondary malignant neoplasm of axillary lymph nodes ([name])   8. Malignant neoplasm of left breast in female, estrogen receptor positive, unspecified site of breast ([name])     HISTORY OF PRESENT ILLNESS:   Melissa [name], a 57 y.o. female, was seen for a [name] cancer genetics consultation at the request of Dr. Lindi Adie due to a personal and family history of cancer.  Melissa [name] presents to clinic today to discuss the possibility of a hereditary predisposition to cancer, genetic testing, and to further clarify her future cancer risks, as well as potential cancer risks for family members.   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  ER+, PR-, HER2+.  She underwent left lymph none dissection followed by chemotherapy,radiation, and antiestrogen therapy.  She has a remote hx of cervical cancer dx in 1990.   CANCER HISTORY:    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[name])   04/14/2017 Initial Diagnosis    Left axillary lymph node biopsy: Metastatic carcinoma positive for CK 7, ER positive,GATA-3 equivocal, negative for CK 20,GCDFP, CDX-2, TTF-1; HER-2 positive ratio 2.08; mammogram and ultrasound revealed 3 discrete abnormal left axillary lymph nodes largest measures 1.8 cm     04/23/2017 Breast MRI    Left axillary lymphadenopathy numerous enlarged level I axillary lymph nodes largest 2.1 cm, no other abdominal masses in the left breast itself, no MRI evidence of malignancy in the right breast    04/26/2017 Imaging    CT CAP: Prominent left axillary lymph nodes no distant metastases, sclerotic focus left pedicle of T9 vertebra favored benign, T5 vertebral hemangioma    05/12/2017 - 08/31/2017 Neo-Adjuvant Chemotherapy    TCH Perjeta x6 followed by Herceptin Perjeta maintenance for 1 year    10/14/2017 Surgery    Left axillary lymph node dissection: 0/13 lymph nodes complete pathologic response.  Breast was not operated on because no breast primary was ever identified    12/09/2017 - 01/11/2018 Radiation Therapy    Adjuvant radiation therapy    01/30/2018 -  Anti-estrogen oral therapy    Anastrozole 1 mg daily, Neratinib started 05/13/2018      HORMONAL RISK FACTORS:  Menarche was at age 44.  First live birth at age 83.  Ovaries intact: yes.  Hysterectomy: yes. At  Age 38 Menopausal status: postmenopausal.  HRT use: 5 years. Colonoscopy: yes normal.  Past Medical History:  Diagnosis Date  . Breast cancer ([place]) 04/2017   left breast  . Cervical cancer ([place])    1990, treated with hysterectomy only  . Family history of breast cancer   . Family history of prostate cancer   . GERD (gastroesophageal reflux disease)   . History of radiation therapy 12/09/17- 01/12/18   Left Breast treated to 50 Gy with 25 fx of 2 Gy  .  Hypercholesteremia   . Hypertension     Past Surgical History:  Procedure Laterality Date  . ABDOMINAL HYSTERECTOMY  1990  . AXILLARY LYMPH NODE DISSECTION Left 10/14/2017   Procedure: LEFT AXILLARY LYMPH NODE DISSECTION;  Surgeon: Rolm Bookbinder, MD;  Location: [facility];  Service: General;  Laterality: Left;  . EYE SURGERY     eye lift  . PORTACATH PLACEMENT Right 05/12/2017   Procedure: INSERTION PORT-A-CATH WITH ULTRASOUND;   Surgeon: Rolm Bookbinder, MD;  Location: [facility];  Service: General;  Laterality: Right;    Social History   Socioeconomic History  . Marital status: Married    Spouse name: Not on file  . Number of children: Not on file  . Years of education: Not on file  . Highest education level: Not on file  Occupational History  . Not on file  Social Needs  . Financial resource strain: Not on file  . Food insecurity:    Worry: Not on file    Inability: Not on file  . Transportation needs:    Medical: Not on file    Non-medical: Not on file  Tobacco Use  . Smoking status: Never Smoker  . Smokeless tobacco: Never Used  Substance and Sexual Activity  . Alcohol use: Yes    Alcohol/week: 2.0 standard drinks    Types: 2 Glasses of wine per week    Frequency: Never    Comment: socially  . Drug use: No  . Sexual activity: Not on file  Lifestyle  . Physical activity:    Days per week: Not on file    Minutes per session: Not on file  . Stress: Not on file  Relationships  . Social connections:    Talks on phone: Not on file    Gets together: Not on file    Attends religious service: Not on file    Active member of club or organization: Not on file    Attends meetings of clubs or organizations: Not on file    Relationship status: Not on file  Other Topics Concern  . Not on file  Social History Narrative  . Not on file     FAMILY HISTORY:  We obtained a detailed, 4-generation family history.  Significant diagnoses are listed below: Family History  Problem Relation Age of Onset  . Lung cancer Father 7  . Prostate cancer Father   . Cervical cancer Sister   . Breast cancer Maternal Aunt        'postmenopausal'  . Prostate cancer Maternal Grandfather        dx 60+  . Mesothelioma Maternal Grandfather        aesbestos exposure    Melissa [name] has a 104 year-old daughter with no history of cancer.  She has a 104 week-old grandson. Melissa [name] has 2 brothers (29 and  73) with no history of cancer as well as a sister who is 34 with a history of cervical cancer (caught very early).    Melissa [name] father: died at 43 with lung cancer.   He had a hx of smoking.  He also had a hx of prostate cancer.  Paternal Aunts/Uncles: none, father was only child Paternal cousins: none Paternal grandfather: no hx of cancer Paternal grandmother:no hx of cancer  Melissa [name]'s mother: deceased, no hx of cancer Maternal Aunts/Uncles: 2 maternal aunts, 1 maternal uncle. 1 maternal aunt has postmenopausal breast cancer.   Maternal cousins: 1 cousin died as a child  due to a Wilms tumor.  Maternal grandfather: died I old age.  He had prostate cancer dx 60+, died of mesothelioma/aesbestos exposure disease.  Maternal grandmother:no history of cancer.   Melissa [name] is unaware of previous family history of genetic testing for hereditary cancer risks. Patient's maternal ancestors are of Caucasian descent, and paternal ancestors are of Caucasian descent. There is no reported Ashkenazi Jewish ancestry. There is no known consanguinity.  GENETIC COUNSELING ASSESSMENT: Melissa [name] is a 57 y.o. female with a personal and family history which is somewhat suggestive of a Hereditary Cancer Predisposition Syndrome. We, therefore, discussed and recommended the following at today's visit.   DISCUSSION: We reviewed the characteristics, features and inheritance patterns of hereditary cancer syndromes. We also discussed genetic testing, including the appropriate family members to test, the process of testing, insurance coverage and turn-around-time for results. We discussed the implications of a negative, positive and/or variant of uncertain significant result. We recommended Melissa [name] pursue genetic testing for the [facility] gene panel.     The [facility] gene panel offered by [employer] includes sequencing and deletion/duplication testing of the following 35 genes: APC, ATM, BARD1, BMPR1A,  BRCA1, BRCA2, BRIP1, CHD1, CDK4, CDKN2A, CHEK2, EPCAM (large rearrangement only), GREM1, HOXB13, AXIN2, MSH3, NTHL1, RNF43, GALNT12, RSP20, MLH1, MSH2, MSH6, MUTYH, NBN, PALB2, PMS2, PTEN, RAD51C, RAD51D, SMAD4, STK11, and TP53. Sequencing was performed for select regions of POLE and POLD1, and large rearrangement analysis was performed for select regions of GREM1.  We discussed that only 5-10% of cancers are associated with a Hereditary cancer predisposition syndrome.  One of the most common hereditary cancer syndromes that increases breast cancer risk is called Hereditary Breast and Ovarian Cancer (HBOC) syndrome.  This syndrome is caused by mutations in the BRCA1 and BRCA2 genes.  This syndrome increases an individual's lifetime risk to develop breast, ovarian, pancreatic, and other types of cancer.  There are also many other cancer predisposition syndromes caused by mutations in several other genes.  We discussed that if she is found to have a mutation in one of these genes, it may impact future medical management recommendations such as increased cancer screenings and consideration of risk reducing surgeries.  A positive result could also have implications for the patient's family members.  A Negative result would mean we were unable to identify a hereditary component to her cancer, but does not rule out the possibility of a hereditary basis for her cancer.  There could be mutations that are undetectable by current technology, or in genes not yet tested or identified to increase cancer risk.    We discussed the potential to find a Variant of Uncertain Significance or VUS.  These are variants that have not yet been identified as pathogenic or benign, and it is unknown if this variant is associated with increased cancer risk or if this is a normal finding.  Most VUS's are reclassified to benign or likely benign.   It should not be used to make medical management decisions. With time, we suspect the lab  will determine the significance of any VUS's identified if any.   There are 3 BRCA related cancers in Ms. Rosengrant/her family- therefore genetic testing is indicated.   Unknown details about if the prostate cancer was high grade. We provided Ms. Hart the information about the lab's patient assistance program and gave her the contact information for the laboratory should she have additional billing/insurance questions.   PLAN: After considering the risks, benefits, and limitations, Ms.  [name]  provided informed consent to pursue genetic testing and the blood sample was sent to [facility] for analysis of the [facility]. Results should be available within approximately 2-3 weeks' time, at which point they will be disclosed by telephone to Melissa [name], as will any additional recommendations warranted by these results. Melissa [name] will receive a summary of her genetic counseling visit and a copy of her results once available. This information will also be available in Epic. We encouraged Melissa [name] to remain in contact with cancer genetics annually so that we can continuously update the family history and inform her of any changes in cancer genetics and testing that may be of benefit for her family. Melissa [name] questions were answered to her satisfaction today. Our contact information was provided should additional questions or concerns arise.  Lastly, we encouraged Melissa [name] to remain in contact with cancer genetics annually so that we can continuously update the family history and inform her of any changes in cancer genetics and testing that may be of benefit for this family.   Ms.  [name] questions were answered to her satisfaction today. Our contact information was provided should additional questions or concerns arise. Thank you for the referral and allowing [place] to share in the care of your patient.   Tana Felts, MS, [facility] Certified Genetic Counselor [email or web address] phone:  [phone]  The patient was seen for a total of 40 minutes in face-to-face genetic counseling.  The patient was accompanied today by her sister, Jacqlyn Larsen.  This patient was discussed with Drs. Magrinat, Lindi Adie and/or Burr Medico who agrees with the above.

## 2018-09-07 ENCOUNTER — Other Ambulatory Visit: Payer: Self-pay

## 2018-09-07 DIAGNOSIS — C801 Malignant (primary) neoplasm, unspecified: Secondary | ICD-10-CM

## 2018-09-07 MED ORDER — BUDESONIDE ER 9 MG PO TB24: 9.0000 mg | ORAL_TABLET | Freq: Every day | ORAL | 2 refills | Status: DC

## 2018-09-07 MED ORDER — NERATINIB MALEATE 40 MG PO TABS: 240.0000 mg | ORAL_TABLET | Freq: Every day | ORAL | 3 refills | Status: DC

## 2018-09-07 MED ORDER — ZOLPIDEM TARTRATE 5 MG PO TABS: 5.0000 mg | ORAL_TABLET | Freq: Every evening | ORAL | 0 refills | Status: DC | PRN

## 2018-09-13 ENCOUNTER — Telehealth: Payer: Self-pay | Admitting: Genetics

## 2018-09-14 NOTE — Telephone Encounter (Signed)
Revealed negative genetic testing.  RThis normal result is reassuring and indicates that it is unlikely Melissa [name]'s cancer is due to a hereditary cause.  It is unlikely that there is an increased risk of another cancer due to a mutation in one of these genes.  However, genetic testing is not perfect, and cannot definitively rule out a hereditary cause.  It will be important for her to keep in contact with genetics to learn if any additional testing may be needed in the future.     Reccommended she had her relatives continue to follow their healthcare providers' recommendations regarding cancer management/screening.   Family history can still impact cancer risk.

## 2018-09-16 ENCOUNTER — Encounter: Payer: Self-pay | Admitting: Genetics

## 2018-09-16 ENCOUNTER — Ambulatory Visit: Payer: Self-pay | Admitting: Genetics

## 2018-09-16 DIAGNOSIS — C50612 Malignant neoplasm of axillary tail of left female breast: Secondary | ICD-10-CM

## 2018-09-16 DIAGNOSIS — Z1379 Encounter for other screening for genetic and chromosomal anomalies: Secondary | ICD-10-CM

## 2018-09-16 DIAGNOSIS — C773 Secondary and unspecified malignant neoplasm of axilla and upper limb lymph nodes: Secondary | ICD-10-CM

## 2018-09-16 DIAGNOSIS — Z8042 Family history of malignant neoplasm of prostate: Secondary | ICD-10-CM

## 2018-09-16 DIAGNOSIS — C801 Malignant (primary) neoplasm, unspecified: Secondary | ICD-10-CM

## 2018-09-16 DIAGNOSIS — Z170 Estrogen receptor positive status [ER+]: Secondary | ICD-10-CM

## 2018-09-16 DIAGNOSIS — Z803 Family history of malignant neoplasm of breast: Secondary | ICD-10-CM

## 2018-09-16 NOTE — Progress Notes (Signed)
HPI:  Melissa [name] was previously seen in the [place] clinic on July 13, 1961 due to a personal and family history of cancer and concerns regarding a hereditary predisposition to cancer. Please refer to our prior cancer genetics clinic note for more information regarding Melissa [name]'s medical, social and family histories, and our assessment and recommendations, at the time. Melissa [name] recent genetic test results were disclosed to her, as well as recommendations warranted by these results. These results and recommendations are discussed in more detail below.  CANCER HISTORY:    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Melissa [name])   04/14/2017 Initial Diagnosis    Left axillary lymph node biopsy: Metastatic carcinoma positive for CK 7, ER positive,GATA-3 equivocal, negative for CK 20,GCDFP, CDX-2, TTF-1; HER-2 positive ratio 2.08; mammogram and ultrasound revealed 3 discrete abnormal left axillary lymph nodes largest measures 1.8 cm    04/23/2017 Breast MRI    Left axillary lymphadenopathy numerous enlarged level I axillary lymph nodes largest 2.1 cm, no other abdominal masses in the left breast itself, no MRI evidence of malignancy in the right breast    04/26/2017 Imaging    CT CAP: Prominent left axillary lymph nodes no distant metastases, sclerotic focus left pedicle of T9 vertebra favored benign, T5 vertebral hemangioma    05/12/2017 - 08/31/2017 Neo-Adjuvant Chemotherapy    TCH Perjeta x6 followed by Herceptin Perjeta maintenance for 1 year    10/14/2017 Surgery    Left axillary lymph node dissection: 0/13 lymph nodes complete pathologic response.  Breast was not operated on because no breast primary was ever identified    12/09/2017 - 01/11/2018 Radiation Therapy    Adjuvant radiation therapy    01/30/2018 -  Anti-estrogen oral therapy    Anastrozole 1 mg daily, Neratinib started 05/13/2018    09/09/2018 Genetic Testing    The MyRisk gene panel offered  by [employer] includes sequencing and deletion/duplication testing of the following 35 genes: APC, ATM, BARD1, BMPR1A, BRCA1, BRCA2, BRIP1, CHD1, CDK4, CDKN2A, CHEK2, EPCAM (large rearrangement only), GREM1, HOXB13, AXIN2, MSH3, NTHL1, RNF43, GALNT12, RSP20, MLH1, MSH2, MSH6, MUTYH, NBN, PALB2, PMS2, PTEN, RAD51C, RAD51D, SMAD4, STK11, and TP53. Sequencing was performed for select regions of POLE and POLD1, and large rearrangement analysis was performed for select regions of GREM1.  Results: Negative, no pathogenic variants identified.  The date of this test report is 09/09/2018.      FAMILY HISTORY:  We obtained a detailed, 4-generation family history.  Significant diagnoses are listed below: Family History  Problem Relation Age of Onset  . Lung cancer Father 77  . Prostate cancer Father   . Cervical cancer Sister   . Breast cancer Maternal Aunt        'postmenopausal'  . Prostate cancer Maternal Grandfather        dx 60+  . Mesothelioma Maternal Grandfather        aesbestos exposure    Melissa [name] has a 95 year-old daughter with no history of cancer.  She has a 20 week-old grandson. Melissa [name] has 2 brothers (72 and 55) with no history of cancer as well as a sister who is 73 with a history of cervical cancer (caught very early).    Melissa [name] father: died at 50 with lung cancer.   He had a hx of smoking.  He also had a hx of prostate cancer.  Paternal Aunts/Uncles: none, father was only child Paternal cousins: none Paternal grandfather: no  hx of cancer Paternal grandmother:no hx of cancer  Melissa [name]'s mother: deceased, no hx of cancer Maternal Aunts/Uncles: 2 maternal aunts, 1 maternal uncle. 1 maternal aunt has postmenopausal breast cancer.   Maternal cousins: 1 cousin died as a child due to a Wilms tumor.  Maternal grandfather: died I old age.  He had prostate cancer dx 60+, died of mesothelioma/aesbestos exposure disease.  Maternal grandmother:no history of  cancer.   Melissa [name] is unaware of previous family history of genetic testing for hereditary cancer risks. Patient's maternal ancestors are of Caucasian descent, and paternal ancestors are of Caucasian descent. There is no reported Ashkenazi Jewish ancestry. There is no known consanguinity.  GENETIC TEST RESULTS: Genetic testing performed through [facility] panel reported out on 09/09/2018 showed no pathogenic mutations. The [facility] gene panel offered by [employer] includes sequencing and deletion/duplication testing of the following 35 genes: APC, ATM, BARD1, BMPR1A, BRCA1, BRCA2, BRIP1, CHD1, CDK4, CDKN2A, CHEK2, EPCAM (large rearrangement only), GREM1, HOXB13, AXIN2, MSH3, NTHL1, RNF43, GALNT12, RSP20, MLH1, MSH2, MSH6, MUTYH, NBN, PALB2, PMS2, PTEN, RAD51C, RAD51D, SMAD4, STK11, and TP53. Sequencing was performed for select regions of POLE and POLD1, and large rearrangement analysis was performed for select regions of GREM1.  The test report will be scanned into EPIC and will be located under the Molecular Pathology section of the Results Review tab. A portion of the result report is included below for reference.     We discussed with Melissa [name] that because current genetic testing is not perfect, it is possible there may be a gene mutation in one of these genes that current testing cannot detect, but that chance is small.  We also discussed, that there could be another gene that has not yet been discovered, or that we have not yet tested, that is responsible for the cancer diagnoses in the family. It is also possible there is a hereditary cause for the cancer in the family that Melissa [name] did not inherit and therefore was not identified in her testing.  Therefore, it is important to remain in touch with cancer genetics in the future so that we can continue to offer Melissa [name] the most up to date genetic testing.   ADDITIONAL GENETIC TESTING: We discussed with Melissa [name] that there  are other genes that are associated with increased cancer risk that can be analyzed. The laboratories that offer this testing look at these additional genes via a hereditary cancer gene panel. Should Melissa [name] wish to pursue additional genetic testing, we are happy to discuss and coordinate this testing, at any time.    CANCER SCREENING RECOMMENDATIONS: Melissa [name] test result is  negative (normal).  This means that we have not identified a hereditary cause for her personal and family history of cancer at this time.  This suggests she is not at any increased genetic risk for additional cancers in the future.   While reassuring, this does not definitively rule out a hereditary predisposition to cancer. It is still possible that there could be genetic mutations that are undetectable by current technology, or genetic mutations in genes that have not been tested or identified to increase cancer risk.  Therefore, it is recommended she continue to follow the cancer management and screening guidelines provided by her oncology and primary healthcare provider. An individual's cancer risk is not determined by genetic test results alone.  Overall cancer risk assessment includes additional factors such as personal medical history, family history, etc.  These should be  used to make a personalized plan for cancer prevention and surveillance.    RECOMMENDATIONS FOR FAMILY MEMBERS:  Relatives in this family might be at some increased risk of developing cancer, over the general population risk, simply due to the family history of cancer.  We recommended women in this family have a yearly mammogram beginning at age 43, or 52 years younger than the earliest onset of cancer, an annual clinical breast exam, and perform monthly breast self-exams. Women in this family should also have a gynecological exam as recommended by their primary provider. All family members should have a colonoscopy by age 1 (or as directed by their  doctors).  All family members should inform their physicians about the family history of cancer so their doctors can make the most appropriate screening recommendations for them.   It is also possible there is a hereditary cause for the cancer in Melissa [name]'s family that she did not inherit and therefore was not identified in her.    FOLLOW-UP: Lastly, we discussed with Melissa [name] that cancer genetics is a rapidly advancing field and it is possible that new genetic tests will be appropriate for her and/or her family members in the future. We encouraged her to remain in contact with cancer genetics on an annual basis so we can update her personal and family histories and let her know of advances in cancer genetics that may benefit this family.   Our contact number was provided. Melissa [name] questions were answered to her satisfaction, and she knows she is welcome to call us at anytime with additional questions or concerns.   Ferol Luz, MS, [facility] Certified Genetic Counselor [name]

## 2018-09-20 ENCOUNTER — Ambulatory Visit: Payer: BLUE CROSS/BLUE SHIELD | Admitting: Hematology and Oncology

## 2018-09-20 ENCOUNTER — Other Ambulatory Visit: Payer: BLUE CROSS/BLUE SHIELD

## 2018-09-27 ENCOUNTER — Telehealth: Payer: Self-pay | Admitting: Pharmacist

## 2018-09-27 ENCOUNTER — Telehealth: Payer: Self-pay | Admitting: Hematology and Oncology

## 2018-09-27 ENCOUNTER — Inpatient Hospital Stay (HOSPITAL_BASED_OUTPATIENT_CLINIC_OR_DEPARTMENT_OTHER): Payer: BLUE CROSS/BLUE SHIELD | Admitting: Hematology and Oncology

## 2018-09-27 ENCOUNTER — Encounter: Payer: Self-pay | Admitting: Pharmacist

## 2018-09-27 ENCOUNTER — Inpatient Hospital Stay: Payer: BLUE CROSS/BLUE SHIELD | Attending: Hematology and Oncology

## 2018-09-27 ENCOUNTER — Encounter: Payer: Self-pay | Admitting: *Deleted

## 2018-09-27 DIAGNOSIS — C773 Secondary and unspecified malignant neoplasm of axilla and upper limb lymph nodes: Secondary | ICD-10-CM

## 2018-09-27 DIAGNOSIS — C50912 Malignant neoplasm of unspecified site of left female breast: Secondary | ICD-10-CM | POA: Diagnosis not present

## 2018-09-27 DIAGNOSIS — R197 Diarrhea, unspecified: Secondary | ICD-10-CM

## 2018-09-27 DIAGNOSIS — Z79811 Long term (current) use of aromatase inhibitors: Secondary | ICD-10-CM

## 2018-09-27 DIAGNOSIS — Z923 Personal history of irradiation: Secondary | ICD-10-CM | POA: Diagnosis not present

## 2018-09-27 DIAGNOSIS — Z170 Estrogen receptor positive status [ER+]: Secondary | ICD-10-CM | POA: Insufficient documentation

## 2018-09-27 DIAGNOSIS — C801 Malignant (primary) neoplasm, unspecified: Secondary | ICD-10-CM

## 2018-09-27 LAB — CBC WITH DIFFERENTIAL (CANCER CENTER ONLY)
Abs Immature Granulocytes: 0.01 10*3/uL (ref 0.00–0.07)
Basophils Absolute: 0 10*3/uL (ref 0.0–0.1)
Basophils Relative: 0 %
EOS ABS: 0 10*3/uL (ref 0.0–0.5)
EOS PCT: 1 %
HCT: 34.1 % — ABNORMAL LOW (ref 36.0–46.0)
Hemoglobin: 11.2 g/dL — ABNORMAL LOW (ref 12.0–15.0)
Immature Granulocytes: 0 %
LYMPHS ABS: 1.4 10*3/uL (ref 0.7–4.0)
Lymphocytes Relative: 30 %
MCH: 30.6 pg (ref 26.0–34.0)
MCHC: 32.8 g/dL (ref 30.0–36.0)
MCV: 93.2 fL (ref 80.0–100.0)
MONO ABS: 0.5 10*3/uL (ref 0.1–1.0)
MONOS PCT: 10 %
Neutro Abs: 2.7 10*3/uL (ref 1.7–7.7)
Neutrophils Relative %: 59 %
Platelet Count: 267 10*3/uL (ref 150–400)
RBC: 3.66 MIL/uL — ABNORMAL LOW (ref 3.87–5.11)
RDW: 13.5 % (ref 11.5–15.5)
WBC Count: 4.6 10*3/uL (ref 4.0–10.5)
nRBC: 0 % (ref 0.0–0.2)

## 2018-09-27 LAB — CMP (CANCER CENTER ONLY)
ALK PHOS: 96 U/L (ref 38–126)
ALT: 21 U/L (ref 0–44)
AST: 18 U/L (ref 15–41)
Albumin: 3.8 g/dL (ref 3.5–5.0)
Anion gap: 7 (ref 5–15)
BILIRUBIN TOTAL: 0.5 mg/dL (ref 0.3–1.2)
BUN: 16 mg/dL (ref 6–20)
CO2: 27 mmol/L (ref 22–32)
CREATININE: 1.01 mg/dL — AB (ref 0.44–1.00)
Calcium: 9.6 mg/dL (ref 8.9–10.3)
Chloride: 106 mmol/L (ref 98–111)
GFR, Est AFR Am: >60 mL/min (ref >60)
GFR, Estimated: 60 mL/min — ABNORMAL LOW (ref >60)
GLUCOSE: 94 mg/dL (ref 70–99)
Potassium: 4.4 mmol/L (ref 3.5–5.1)
SODIUM: 140 mmol/L (ref 135–145)
Total Protein: 6.7 g/dL (ref 6.5–8.1)

## 2018-09-27 MED ORDER — ANASTROZOLE 1 MG PO TABS: 1.0000 mg | ORAL_TABLET | Freq: Every day | ORAL | 3 refills | Status: DC

## 2018-09-27 NOTE — Telephone Encounter (Signed)
Called patient to discuss her [name] copayment. During a follow-up visit with Dr. Lindi Adie today, she expressed concerns of how she would be able to continue to afford [name] beginning in January. The patient currently has a copay assistance card which makes all copayments for this medication $0 and will continue for the duration of her therapy, expected end date June 2020.   Patient expressed appreciation and understanding of this information.   Jalene Mullet, PharmD PGY2 Hematology/ Oncology Pharmacy Resident 09/27/2018 1:06 PM

## 2018-09-27 NOTE — Progress Notes (Signed)
Patient Care Team: Deland Pretty, MD as PCP - General (Internal Medicine)  DIAGNOSIS:  Encounter Diagnosis  Name Primary?  . 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[place])     SUMMARY OF ONCOLOGIC HISTORY:   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[place])   04/14/2017 Initial Diagnosis    Left axillary lymph node biopsy: Metastatic carcinoma positive for CK 7, ER positive,GATA-3 equivocal, negative for CK 20,GCDFP, CDX-2, TTF-1; HER-2 positive ratio 2.08; mammogram and ultrasound revealed 3 discrete abnormal left axillary lymph nodes largest measures 1.8 cm    04/23/2017 Breast MRI    Left axillary lymphadenopathy numerous enlarged level I axillary lymph nodes largest 2.1 cm, no other abdominal masses in the left breast itself, no MRI evidence of malignancy in the right breast    04/26/2017 Imaging    CT CAP: Prominent left axillary lymph nodes no distant metastases, sclerotic focus left pedicle of T9 vertebra favored benign, T5 vertebral hemangioma    05/12/2017 - 08/31/2017 Neo-Adjuvant Chemotherapy    TCH Perjeta x6 followed by Herceptin Perjeta maintenance for 1 year    10/14/2017 Surgery    Left axillary lymph node dissection: 0/13 lymph nodes complete pathologic response.  Breast was not operated on because no breast primary was ever identified    12/09/2017 - 01/11/2018 Radiation Therapy    Adjuvant radiation therapy    01/30/2018 -  Anti-estrogen oral therapy    Anastrozole 1 mg daily, Neratinib started 05/13/2018    09/09/2018 Genetic Testing    The MyRisk gene panel offered by [employer] includes sequencing and deletion/duplication testing of the following 35 genes: APC, ATM, BARD1, BMPR1A, BRCA1, BRCA2, BRIP1, CHD1, CDK4, CDKN2A, CHEK2, EPCAM (large rearrangement only), GREM1, HOXB13, AXIN2, MSH3, NTHL1, RNF43, GALNT12, RSP20, MLH1, MSH2, MSH6, MUTYH, NBN, PALB2, PMS2, PTEN, RAD51C,  RAD51D, SMAD4, STK11, and TP53. Sequencing was performed for select regions of POLE and POLD1, and large rearrangement analysis was performed for select regions of GREM1.  Results: Negative, no pathogenic variants identified.  The date of this test report is 09/09/2018.     CHIEF COMPLIANT: Follow-up on Neratinib  INTERVAL HISTORY: ROLLA SERVIDIO is a 57 year old with above-mentioned history of HER-2 positive left breast cancer treated with neoadjuvant chemotherapy followed by axillary lymph node dissection and had a complete pathologic response.  She is currently on anastrozole and Neratinib.  After the first 2 months of diarrhea from Neratinib, she is doing much better.  She does have a regimen of Imodium and Lomotil which appears to be working for her.  She is anxious about what happens at the beginning of the year when her new deductible begins.  She is worried about the cost of the medication.  Denies any arthralgias but she does have hot flashes from anastrozole therapy.  REVIEW OF SYSTEMS:   Constitutional: Denies fevers, chills or abnormal weight loss Eyes: Denies blurriness of vision Ears, nose, mouth, throat, and face: Denies mucositis or sore throat Respiratory: Denies cough, dyspnea or wheezes Cardiovascular: Denies palpitation, chest discomfort Gastrointestinal: Intermittent diarrhea managed with Lomotil and Imodium Skin: Denies abnormal skin rashes Lymphatics: Denies new lymphadenopathy or easy bruising Neurological:Denies numbness, tingling or new weaknesses Behavioral/Psych: Mood is stable, no new changes  Extremities: No lower extremity edema Breast:  denies any pain or lumps or nodules in either breasts All other systems were reviewed with the patient and are negative.  I have reviewed the past medical  history, past surgical history, social history and family history with the patient and they are unchanged from previous note.  ALLERGIES:  has No Known  Allergies.  MEDICATIONS:  Current Outpatient Medications  Medication Sig Dispense Refill  . ALPRAZolam (XANAX) 0.5 MG tablet Take 0.5 mg by mouth 3 (three) times daily as needed for anxiety.   0  . anastrozole (ARIMIDEX) 1 MG tablet Take 1 tablet (1 mg total) by mouth daily. 90 tablet 3  . Budesonide ER (UCERIS) 9 MG TB24 Take 9 mg by mouth daily. 30 tablet 2  . calcium carbonate (OS-CAL) 1250 (500 Ca) MG chewable tablet Chew 1 tablet by mouth daily. Calcium 600 mg    . cholecalciferol (VITAMIN D) 1000 units tablet Take 1,000 Units by mouth daily. 2000 iu    . diphenoxylate-atropine (LOMOTIL) 2.5-0.025 MG tablet Take 2 tablets by mouth 4 (four) times daily as needed for diarrhea or loose stools. 120 tablet 6  . levocetirizine (XYZAL) 5 MG tablet Take 5 mg by mouth every evening.    . loperamide (IMODIUM) 2 MG capsule Take 2 capsules (4 mg total) by mouth 3 (three) times daily as needed for diarrhea or loose stools. 120 capsule 6  . Neratinib Maleate ([name]) 40 MG tablet Take 6 tablets (240 mg total) by mouth daily. Take with food. 180 tablet 3  . rosuvastatin (CRESTOR) 10 MG tablet Take 10 mg by mouth daily.   0  . triamterene-hydrochlorothiazide (MAXZIDE-25) 37.5-25 MG tablet Take 1 tablet by mouth daily.    . Turmeric 500 MG TABS Take by mouth.    . zolpidem (AMBIEN) 5 MG tablet Take 1 tablet (5 mg total) by mouth at bedtime as needed. for sleep 90 tablet 0   No current facility-administered medications for this visit.     PHYSICAL EXAMINATION: ECOG PERFORMANCE STATUS: 1 - Symptomatic but completely ambulatory  Vitals:   09/27/18 1037  BP: (!) 120/92  Pulse: 78  Resp: 17  Temp: 98.5 F (36.9 C)  SpO2: 100%   Filed Weights   09/27/18 1037  Weight: 138 lb 3.2 oz (62.7 kg)    GENERAL:alert, no distress and comfortable SKIN: skin color, texture, turgor are normal, no rashes or significant lesions EYES: normal, Conjunctiva are pink and non-injected, sclera clear OROPHARYNX:no  exudate, no erythema and lips, buccal mucosa, and tongue normal  NECK: supple, thyroid normal size, non-tender, without nodularity LYMPH:  no palpable lymphadenopathy in the cervical, axillary or inguinal LUNGS: clear to auscultation and percussion with normal breathing effort HEART: regular rate & rhythm and no murmurs and no lower extremity edema ABDOMEN:abdomen soft, non-tender and normal bowel sounds MUSCULOSKELETAL:no cyanosis of digits and no clubbing  NEURO: alert & oriented x 3 with fluent speech, no focal motor/sensory deficits EXTREMITIES: No lower extremity edema   LABORATORY DATA:  I have reviewed the data as listed CMP Latest Ref Rng & Units 09/27/2018 09/01/2018 08/16/2018  Glucose 70 - 99 mg/dL 94 105(H) 110(H)  BUN 6 - 20 mg/dL 16 17 26(H)  Creatinine 0.44 - 1.00 mg/dL 1.01(H) 1.09(H) 1.01(H)  Sodium 135 - 145 mmol/L 140 139 137  Potassium 3.5 - 5.1 mmol/L 4.4 3.7 3.6  Chloride 98 - 111 mmol/L 106 103 106  CO2 22 - 32 mmol/L 27 29 20(L)  Calcium 8.9 - 10.3 mg/dL 9.6 9.1 9.5  Total Protein 6.5 - 8.1 g/dL 6.7 6.7 7.0  Total Bilirubin 0.3 - 1.2 mg/dL 0.5 0.4 0.7  Alkaline Phos 38 - 126 U/L 96  92 103  AST 15 - 41 U/L 18 19 18   ALT 0 - 44 U/L 21 21 21     Lab Results  Component Value Date   WBC 4.6 09/27/2018   HGB 11.2 (L) 09/27/2018   HCT 34.1 (L) 09/27/2018   MCV 93.2 09/27/2018   PLT 267 09/27/2018   NEUTROABS 2.7 09/27/2018    ASSESSMENT & PLAN:  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 Neoadjuvant TCH Perjeta 6 cycles completed 08/31/2017 followed by Herceptin, Perjeta maintenance for 1  year 2. followed by axillary lymph node dissection. Breast surgery will not be performed because there is no primary tumor identifiable on breast MRI: 10/14/17: 0/13 LN Negative 3. Followed by radiation 12/08/2017-01/12/2018 4. Followed by antiestrogen therapy started 01/30/2018 5.Herceptin Perjeta maintenance completed 05/10/2018 6.Neratinib started 05/13/2018 ------------------------------------------------------------------------------------------------------------------ Current Treatment:Anastrozole with Neratinib  Neratinibtoxicities: 1.Diarrhea especially at night  she completed budesonide therapy.  She is currently on Imodium and Lomotil.    She is taking Lomotil twice a day and then Imodium in the middle of the day. This is been controlling her diarrhea.    Anxiety regarding prescription coverage at the beginning of the year.  I counseled her that we will have pharmacy assist [place] in finding coverage if she has a high co-pay or deductible.  Letrozole toxicities:Hot flashes.  Return to clinic in74month to follow-up    Orders Placed This Encounter  Procedures  . CBC with Differential (Cancer Center Only)    Standing Status:   Future    Standing Expiration Date:   09/28/2019  . CMP ([facility])    Standing Status:   Future    Standing Expiration Date:   09/28/2019   The patient has a good understanding of the overall plan. she agrees with it. she will call with any problems that may develop before the next visit here.   VHarriette Ohara MD 09/27/18

## 2018-09-27 NOTE — Telephone Encounter (Signed)
Gave avs and calendar ° °

## 2018-09-27 NOTE — Assessment & Plan Note (Signed)
04/14/2017 Left axillary lymph node biopsy: Metastatic carcinoma positive for CK 7, ER positive,GATA-3 equivocal, negative for CK 20,GCDFP, CDX-2, TTF-1; HER-2 positive ratio 2.08 mammogram and ultrasound revealed 3 discrete abnormal left axillary lymph nodes largest measures 1.8 cm Clinical stage: TX N1MX  Treatment plan: 1. Neoadjuvant TCH Perjeta 6 cycles completed 08/31/2017 followed by Herceptin, Perjeta maintenance for 1 year 2. followed by axillary lymph node dissection. Breast surgery will not be performed because there is no primary tumor identifiable on breast MRI: 10/14/17: 0/13 LN Negative 3. Followed by radiation 12/08/2017-01/12/2018 4. Followed by antiestrogen therapy started 01/30/2018 5. Herceptin Perjeta maintenance completed 05/10/2018 6. Neratinib started 05/13/2018 ------------------------------------------------------------------------------------------------------------------ Current Treatment:Anastrozole with Neratinib  Neratinib toxicities:  1. Diarrhea especially at night   she completed budesonide therapy.  She is currently on Imodium and Lomotil.  She takes up to 4 Imodium's and 2 Lomotil 70-day.  This is been controlling her diarrhea.  I renewed her prescriptions for both of these medications today.  Letrozole toxicities: Occasional stiffness as well as hot flashes but not worrying her significantly.  Return to clinic in 2 months to follow-up 

## 2018-09-29 ENCOUNTER — Other Ambulatory Visit: Payer: Self-pay | Admitting: Hematology and Oncology

## 2018-10-06 DIAGNOSIS — M79674 Pain in right toe(s): Secondary | ICD-10-CM | POA: Diagnosis not present

## 2018-10-06 DIAGNOSIS — L02611 Cutaneous abscess of right foot: Secondary | ICD-10-CM | POA: Diagnosis not present

## 2018-10-06 DIAGNOSIS — L03031 Cellulitis of right toe: Secondary | ICD-10-CM | POA: Diagnosis not present

## 2018-10-20 DIAGNOSIS — M79674 Pain in right toe(s): Secondary | ICD-10-CM | POA: Diagnosis not present

## 2018-10-20 DIAGNOSIS — L02611 Cutaneous abscess of right foot: Secondary | ICD-10-CM | POA: Diagnosis not present

## 2018-10-20 DIAGNOSIS — L03031 Cellulitis of right toe: Secondary | ICD-10-CM | POA: Diagnosis not present

## 2018-10-20 DIAGNOSIS — L03032 Cellulitis of left toe: Secondary | ICD-10-CM | POA: Diagnosis not present

## 2018-10-27 IMAGING — MG DIGITAL DIAGNOSTIC BILATERAL MAMMOGRAM WITH TOMO AND CAD
6 of 9 series · 6 of 25 positions shown · non-contrast
Comparison: Previous exam(s).

CLINICAL DATA: 57-year-old female presenting for annual bilateral
mammogram. The patient is status post neoadjuvant chemotherapy and
left axillary dissection for a metastatic lymph node as well as
radiation therapy. A primary malignancy was not identified on prior
mammograms or MRI workup.

EXAM:
DIGITAL DIAGNOSTIC BILATERAL MAMMOGRAM WITH CAD AND TOMO

[R CC synth-2D]
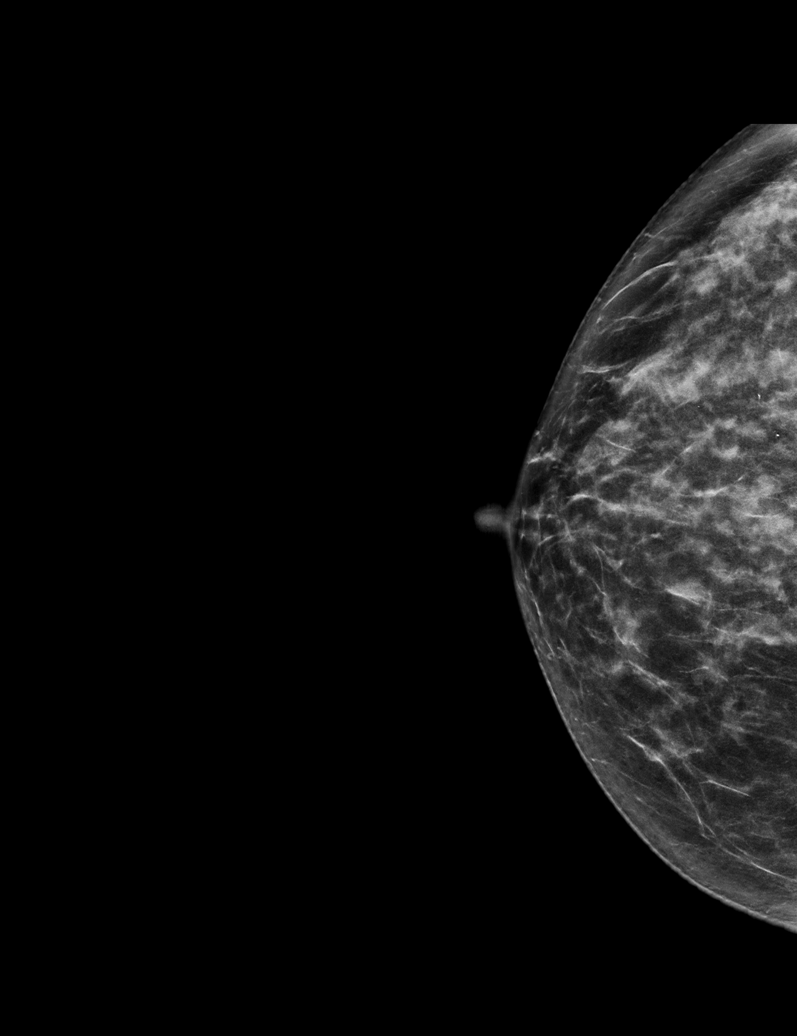

[L CC synth-2D]
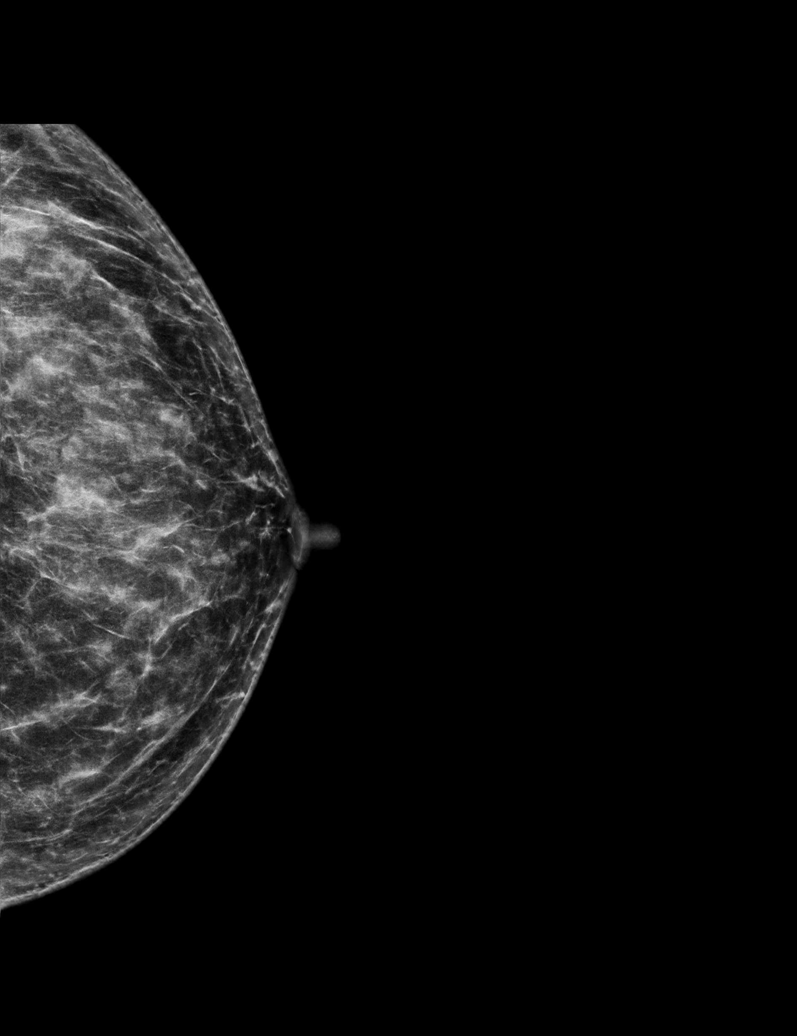

[L MLO synth-2D]
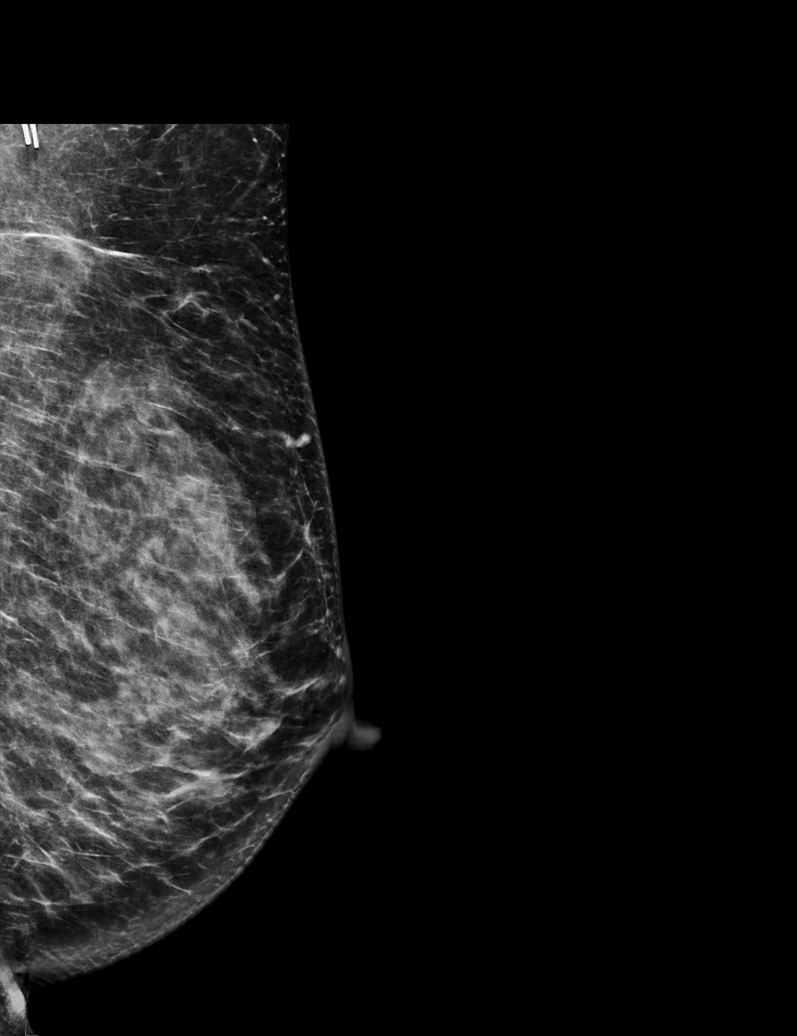

[R MLO synth-2D (1 of 2)]
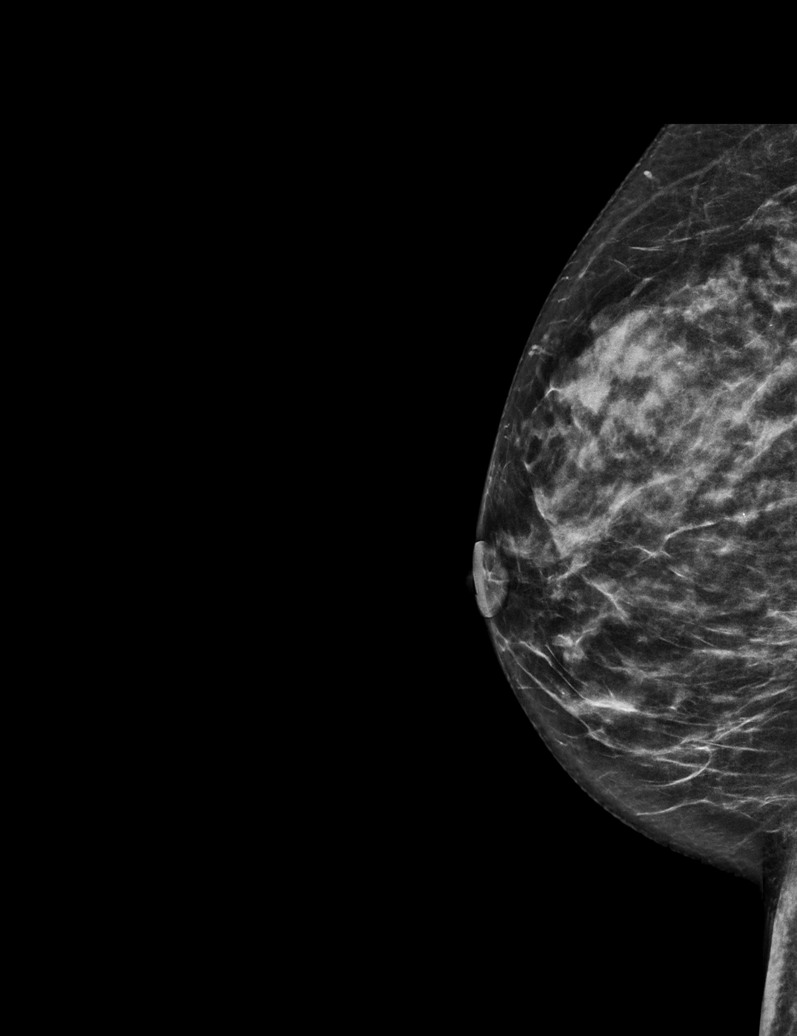

[R MLO synth-2D (2 of 2)]
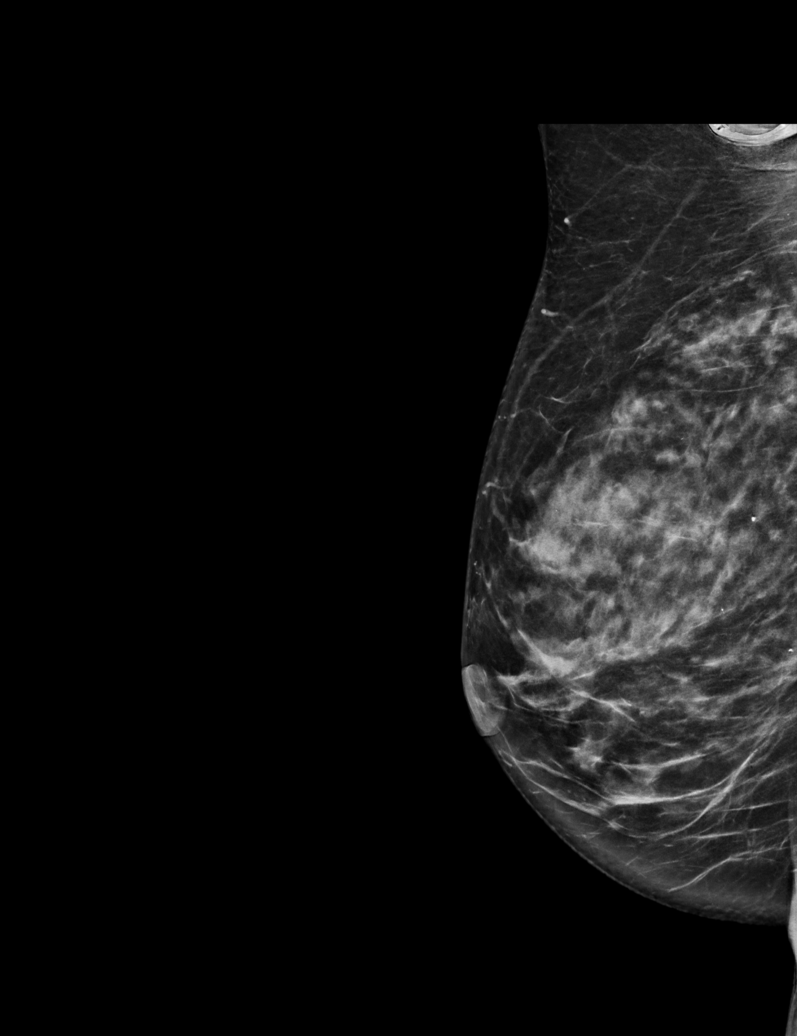

[R MLO tomo · tomo slice 33/66.0]
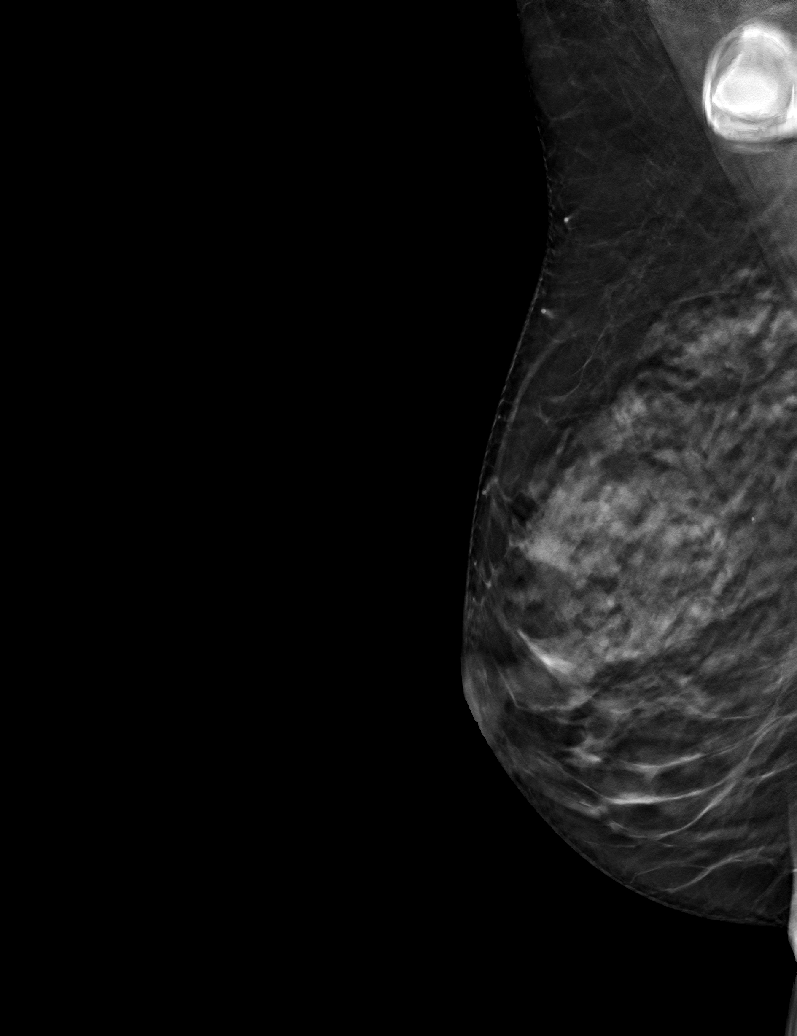

[6 of 25 positions shown; findings below may reference images not displayed]

ACR Breast Density Category c: The breast tissue is heterogeneously
dense, which may obscure small masses.
FINDINGS: No suspicious mammographic findings are identified in either breast.
Postsurgical changes are demonstrated within the left axilla.
Evaluation of the right axilla is slightly limited due to port
placement.

Mammographic images were processed with CAD.
IMPRESSION: 1. No mammographic evidence of malignancy in either breast.
Recommendation is for continued clinical treatment plan.
2. Postoperative changes of the left axilla.

RECOMMENDATION:
1. Recommendation is for clinical follow-up and continued treatment
plan.
2.  Screening mammogram in one year.(Code:WY-D-G2T)

I have discussed the findings and recommendations with the patient.
Results were also provided in writing at the conclusion of the
visit. If applicable, a reminder letter will be sent to the patient
regarding the next appointment.

BI-RADS CATEGORY  1: Negative.

## 2018-11-17 DIAGNOSIS — M79674 Pain in right toe(s): Secondary | ICD-10-CM | POA: Diagnosis not present

## 2018-11-17 DIAGNOSIS — L03031 Cellulitis of right toe: Secondary | ICD-10-CM | POA: Diagnosis not present

## 2018-11-17 DIAGNOSIS — M79675 Pain in left toe(s): Secondary | ICD-10-CM | POA: Diagnosis not present

## 2018-11-17 DIAGNOSIS — L03032 Cellulitis of left toe: Secondary | ICD-10-CM | POA: Diagnosis not present

## 2018-12-01 ENCOUNTER — Inpatient Hospital Stay (HOSPITAL_BASED_OUTPATIENT_CLINIC_OR_DEPARTMENT_OTHER): Payer: BLUE CROSS/BLUE SHIELD | Admitting: Hematology and Oncology

## 2018-12-01 ENCOUNTER — Inpatient Hospital Stay: Payer: BLUE CROSS/BLUE SHIELD | Attending: Hematology and Oncology

## 2018-12-01 ENCOUNTER — Telehealth: Payer: Self-pay | Admitting: Hematology and Oncology

## 2018-12-01 DIAGNOSIS — C773 Secondary and unspecified malignant neoplasm of axilla and upper limb lymph nodes: Secondary | ICD-10-CM

## 2018-12-01 DIAGNOSIS — T451X5D Adverse effect of antineoplastic and immunosuppressive drugs, subsequent encounter: Secondary | ICD-10-CM | POA: Diagnosis not present

## 2018-12-01 DIAGNOSIS — Z923 Personal history of irradiation: Secondary | ICD-10-CM | POA: Insufficient documentation

## 2018-12-01 DIAGNOSIS — G620 Drug-induced polyneuropathy: Secondary | ICD-10-CM | POA: Insufficient documentation

## 2018-12-01 DIAGNOSIS — Z170 Estrogen receptor positive status [ER+]: Secondary | ICD-10-CM

## 2018-12-01 DIAGNOSIS — Z79811 Long term (current) use of aromatase inhibitors: Secondary | ICD-10-CM | POA: Diagnosis not present

## 2018-12-01 DIAGNOSIS — Z79899 Other long term (current) drug therapy: Secondary | ICD-10-CM

## 2018-12-01 DIAGNOSIS — C50912 Malignant neoplasm of unspecified site of left female breast: Secondary | ICD-10-CM

## 2018-12-01 LAB — CMP (CANCER CENTER ONLY)
ALT: 16 U/L (ref 0–44)
AST: 19 U/L (ref 15–41)
Albumin: 3.7 g/dL (ref 3.5–5.0)
Alkaline Phosphatase: 97 U/L (ref 38–126)
Anion gap: 8 (ref 5–15)
BUN: 17 mg/dL (ref 6–20)
CHLORIDE: 105 mmol/L (ref 98–111)
CO2: 28 mmol/L (ref 22–32)
CREATININE: 1 mg/dL (ref 0.44–1.00)
Calcium: 9.8 mg/dL (ref 8.9–10.3)
GFR, Est AFR Am: >60 mL/min (ref >60)
Glucose, Bld: 93 mg/dL (ref 70–99)
Potassium: 4.1 mmol/L (ref 3.5–5.1)
SODIUM: 141 mmol/L (ref 135–145)
Total Bilirubin: 0.3 mg/dL (ref 0.3–1.2)
Total Protein: 6.9 g/dL (ref 6.5–8.1)

## 2018-12-01 LAB — CBC WITH DIFFERENTIAL (CANCER CENTER ONLY)
Abs Immature Granulocytes: 0.01 10*3/uL (ref 0.00–0.07)
Basophils Absolute: 0 10*3/uL (ref 0.0–0.1)
Basophils Relative: 1 %
EOS PCT: 2 %
Eosinophils Absolute: 0.1 10*3/uL (ref 0.0–0.5)
HEMATOCRIT: 37.4 % (ref 36.0–46.0)
HEMOGLOBIN: 12.1 g/dL (ref 12.0–15.0)
Immature Granulocytes: 0 %
LYMPHS ABS: 1.5 10*3/uL (ref 0.7–4.0)
Lymphocytes Relative: 27 %
MCH: 29.2 pg (ref 26.0–34.0)
MCHC: 32.4 g/dL (ref 30.0–36.0)
MCV: 90.3 fL (ref 80.0–100.0)
MONO ABS: 0.5 10*3/uL (ref 0.1–1.0)
MONOS PCT: 9 %
Neutro Abs: 3.3 10*3/uL (ref 1.7–7.7)
Neutrophils Relative %: 61 %
Platelet Count: 282 10*3/uL (ref 150–400)
RBC: 4.14 MIL/uL (ref 3.87–5.11)
RDW: 12.6 % (ref 11.5–15.5)
WBC Count: 5.4 10*3/uL (ref 4.0–10.5)
nRBC: 0 % (ref 0.0–0.2)

## 2018-12-01 MED ORDER — ZOLPIDEM TARTRATE 5 MG PO TABS: 5.0000 mg | ORAL_TABLET | Freq: Every evening | ORAL | 3 refills | Status: DC | PRN

## 2018-12-01 NOTE — Progress Notes (Signed)
Patient Care Team: Deland Pretty, MD as PCP - General (Internal Medicine)  DIAGNOSIS:  Encounter Diagnosis  Name Primary?  . 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[place])   04/14/2017 Initial Diagnosis    Left axillary lymph node biopsy: Metastatic carcinoma positive for CK 7, ER positive,GATA-3 equivocal, negative for CK 20,GCDFP, CDX-2, TTF-1; HER-2 positive ratio 2.08; mammogram and ultrasound revealed 3 discrete abnormal left axillary lymph nodes largest measures 1.8 cm    04/23/2017 Breast MRI    Left axillary lymphadenopathy numerous enlarged level I axillary lymph nodes largest 2.1 cm, no other abdominal masses in the left breast itself, no MRI evidence of malignancy in the right breast    04/26/2017 Imaging    CT CAP: Prominent left axillary lymph nodes no distant metastases, sclerotic focus left pedicle of T9 vertebra favored benign, T5 vertebral hemangioma    05/12/2017 - 08/31/2017 Neo-Adjuvant Chemotherapy    TCH Perjeta x6 followed by Herceptin Perjeta maintenance for 1 year    10/14/2017 Surgery    Left axillary lymph node dissection: 0/13 lymph nodes complete pathologic response.  Breast was not operated on because no breast primary was ever identified    12/09/2017 - 01/11/2018 Radiation Therapy    Adjuvant radiation therapy    01/30/2018 -  Anti-estrogen oral therapy    Anastrozole 1 mg daily, Neratinib started 05/13/2018    09/09/2018 Genetic Testing    The MyRisk gene panel offered by [employer] includes sequencing and deletion/duplication testing of the following 35 genes: APC, ATM, BARD1, BMPR1A, BRCA1, BRCA2, BRIP1, CHD1, CDK4, CDKN2A, CHEK2, EPCAM (large rearrangement only), GREM1, HOXB13, AXIN2, MSH3, NTHL1, RNF43, GALNT12, RSP20, MLH1, MSH2, MSH6, MUTYH, NBN, PALB2, PMS2, PTEN, RAD51C,  RAD51D, SMAD4, STK11, and TP53. Sequencing was performed for select regions of POLE and POLD1, and large rearrangement analysis was performed for select regions of GREM1.  Results: Negative, no pathogenic variants identified.  The date of this test report is 09/09/2018.     CHIEF COMPLIANT: Follow-up on neratinib and anastrozole  INTERVAL HISTORY: Melissa [name] is a 39-year with above-mentioned history of HER-2 positive left breast cancer who is currently on neratinib and anastrozole.  She is tolerating neratinib reasonably well.  She takes Imodium as needed and Lomotil scheduled in the morning at night and this appears to be doing the job.  She continues to have pretty profound hot flashes but she is able to manage them.  She has a slight sore throat but no fevers or chills.  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  I have reviewed the past medical history, past surgical history, social history and family history with the patient and they are unchanged from previous note.  ALLERGIES:  has No Known Allergies.  MEDICATIONS:  Current Outpatient Medications  Medication Sig Dispense Refill  . ALPRAZolam (  XANAX) 0.5 MG tablet Take 0.5 mg by mouth 3 (three) times daily as needed for anxiety.   0  . anastrozole (ARIMIDEX) 1 MG tablet Take 1 tablet (1 mg total) by mouth daily. 90 tablet 3  .  anastrozole (ARIMIDEX) 1 MG tablet TAKE 1 TABLET(1 MG) BY MOUTH DAILY 90 tablet 0  . Budesonide ER (UCERIS) 9 MG TB24 Take 9 mg by mouth daily. 30 tablet 2  . calcium carbonate (OS-CAL) 1250 (500 Ca) MG chewable tablet Chew 1 tablet by mouth daily. Calcium 600 mg    . cholecalciferol (VITAMIN D) 1000 units tablet Take 1,000 Units by mouth daily. 2000 iu    . diphenoxylate-atropine (LOMOTIL) 2.5-0.025 MG tablet Take 2 tablets by mouth 4 (four) times daily as needed for diarrhea or loose stools. 120 tablet 6  . levocetirizine (XYZAL) 5 MG tablet Take 5 mg by mouth every evening.    . loperamide (IMODIUM) 2 MG capsule Take 2 capsules (4 mg total) by mouth 3 (three) times daily as needed for diarrhea or loose stools. 120 capsule 6  . Neratinib Maleate ([name]) 40 MG tablet Take 6 tablets (240 mg total) by mouth daily. Take with food. 180 tablet 3  . rosuvastatin (CRESTOR) 10 MG tablet Take 10 mg by mouth daily.   0  . triamterene-hydrochlorothiazide (MAXZIDE-25) 37.5-25 MG tablet Take 1 tablet by mouth daily.    . Turmeric 500 MG TABS Take by mouth.    . zolpidem (AMBIEN) 5 MG tablet Take 1 tablet (5 mg total) by mouth at bedtime as needed. for sleep 90 tablet 0   No current facility-administered medications for this visit.     PHYSICAL EXAMINATION: ECOG PERFORMANCE STATUS: 1 - Symptomatic but completely ambulatory  Vitals:   12/01/18 1027  BP: 140/76  Pulse: 84  Resp: 18  Temp: 98.1 F (36.7 C)  SpO2: 99%   Filed Weights   12/01/18 1027  Weight: 141 lb 4.8 oz (64.1 kg)    GENERAL:alert, no distress and comfortable SKIN: skin color, texture, turgor are normal, no rashes or significant lesions EYES: normal, Conjunctiva are pink and non-injected, sclera clear OROPHARYNX:no exudate, no erythema and lips, buccal mucosa, and tongue normal  NECK: supple, thyroid normal size, non-tender, without nodularity LYMPH:  no palpable lymphadenopathy in the cervical, axillary or inguinal LUNGS:  clear to auscultation and percussion with normal breathing effort HEART: regular rate & rhythm and no murmurs and no lower extremity edema ABDOMEN:abdomen soft, non-tender and normal bowel sounds MUSCULOSKELETAL:no cyanosis of digits and no clubbing  NEURO: alert & oriented x 3 with fluent speech, no focal motor/sensory deficits EXTREMITIES: No lower extremity edema    LABORATORY DATA:  I have reviewed the data as listed CMP Latest Ref Rng & Units 09/27/2018 09/01/2018 08/16/2018  Glucose 70 - 99 mg/dL 94 105(H) 110(H)  BUN 6 - 20 mg/dL 16 17 26(H)  Creatinine 0.44 - 1.00 mg/dL 1.01(H) 1.09(H) 1.01(H)  Sodium 135 - 145 mmol/L 140 139 137  Potassium 3.5 - 5.1 mmol/L 4.4 3.7 3.6  Chloride 98 - 111 mmol/L 106 103 106  CO2 22 - 32 mmol/L 27 29 20(L)  Calcium 8.9 - 10.3 mg/dL 9.6 9.1 9.5  Total Protein 6.5 - 8.1 g/dL 6.7 6.7 7.0  Total Bilirubin 0.3 - 1.2 mg/dL 0.5 0.4 0.7  Alkaline Phos 38 - 126 U/L 96 92 103  AST 15 - 41 U/L 18 19 18   ALT 0 - 44 U/L 21 21 21  Lab Results  Component Value Date   WBC 5.4 12/01/2018   HGB 12.1 12/01/2018   HCT 37.4 12/01/2018   MCV 90.3 12/01/2018   PLT 282 12/01/2018   NEUTROABS 3.3 12/01/2018    ASSESSMENT & PLAN:  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 Neoadjuvant TCH Perjeta 6 cycles completed 08/31/2017 followed by Herceptin, Perjeta maintenance for 1 year 2. followed by axillary lymph node dissection. Breast surgery will not be performed because there is no primary tumor identifiable on breast MRI: 10/14/17: 0/13 LN Negative 3. Followed by radiation  12/08/2017-01/12/2018 4. Followed by antiestrogen therapy started 01/30/2018 5.Herceptin Perjeta maintenance completed 05/10/2018 6.Neratinib started 05/13/2018 ------------------------------------------------------------------------------------------------------------------ Current Treatment:Anastrozole with Neratinib  Neratinibtoxicities: 1.Diarrhea especially at nightshe completed budesonide therapy. She is currently on Imodium and Lomotil.   She is taking Lomotil twice a day and then Imodium in the middle of the day.This is been controlling her diarrhea.   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  Letrozole toxicities:Hot flashes.  This can be moderate in intensity Insomnia: Refill her Ambien Surveillance: Mammogram will be done after Apr 21, 2019 Return to clinic in31month to follow-up   No orders of the defined types were placed in this encounter.  The patient has a good understanding of the overall plan. she agrees with it. she will call with any problems that may develop before the next visit here.   VHarriette Ohara MD 12/01/18

## 2018-12-01 NOTE — Telephone Encounter (Signed)
Scheduled appt per 12/26 los - gave patient AVS and calender per los.   

## 2018-12-01 NOTE — Assessment & Plan Note (Signed)
04/14/2017 Left axillary lymph node biopsy: Metastatic carcinoma positive for CK 7, ER positive,GATA-3 equivocal, negative for CK 20,GCDFP, CDX-2, TTF-1; HER-2 positive ratio 2.08 mammogram and ultrasound revealed 3 discrete abnormal left axillary lymph nodes largest measures 1.8 cm Clinical stage: TX N1MX  Treatment plan: 1. Neoadjuvant TCH Perjeta 6 cycles completed 08/31/2017 followed by Herceptin, Perjeta maintenance for 1 year 2. followed by axillary lymph node dissection. Breast surgery will not be performed because there is no primary tumor identifiable on breast MRI: 10/14/17: 0/13 LN Negative 3. Followed by radiation 12/08/2017-01/12/2018 4. Followed by antiestrogen therapy started 01/30/2018 5.Herceptin Perjeta maintenance completed 05/10/2018 6.Neratinib started 05/13/2018 ------------------------------------------------------------------------------------------------------------------ Current Treatment:Anastrozole with Neratinib  Neratinibtoxicities: 1.Diarrhea especially at nightshe completed budesonide therapy. She is currently on Imodium and Lomotil.   She is taking Lomotil twice a day and then Imodium in the middle of the day.This is been controlling her diarrhea.   Letrozole toxicities:Hot flashes.  Return to clinic in79month to follow-up

## 2018-12-05 DIAGNOSIS — L03031 Cellulitis of right toe: Secondary | ICD-10-CM | POA: Diagnosis not present

## 2018-12-05 DIAGNOSIS — L03032 Cellulitis of left toe: Secondary | ICD-10-CM | POA: Diagnosis not present

## 2018-12-27 ENCOUNTER — Telehealth: Payer: Self-pay | Admitting: Pharmacist

## 2018-12-27 NOTE — Telephone Encounter (Signed)
Oral Oncology Pharmacist Encounter  Received call from [facility], breast navigator, that she had been contacted by patient due to an unaffordable co-pay for her next fill of [name]. Patient stated that the pharmacy had quoted her a [phone] copayment for her next fill of [name], as she is required to meet her entire deductible prior to insurance reducing the copayment.  I contacted [place] at [phone] to gather more information about patient's [name].  Representative stated that claim at the pharmacy is returning a $6900 copayment for [name]. Representative stated that patient does not currently have a [name] copayment card, however, she is eligible for this.   She also stated that they had offered to obtain manufacturer copayment coupon for patient earlier today, and the patient declined stating that she needed to contact her insurance company.  Representative went through the specifics of the [name] copayment coupon, maximum allowable benefit per year is $25,000, maximum allowable benefit for the first month is $7100, and up to $4000 for each subsequent fill.   Copayment due from the patient after application of the manufacturer copayment coupon would be $10 per fill. Representative stated that I could contact the patient and have her return a call to [employer], and they would be happy to obtain the copayment coupon on her behalf.  Called patient and reported above information. Patient stated she would call [employer] right away to have them obtain this coupon for her. She will return a call to oral oncology clinic if she has any additional questions or concerns.  Johny Drilling, PharmD, BCPS, BCOP  12/27/2018 3:19 PM Oral Oncology Clinic [phone]

## 2019-01-09 ENCOUNTER — Telehealth: Payer: Self-pay | Admitting: Hematology and Oncology

## 2019-01-09 NOTE — Telephone Encounter (Signed)
Patient called to reschedule  °

## 2019-01-19 ENCOUNTER — Other Ambulatory Visit: Payer: Self-pay

## 2019-01-19 DIAGNOSIS — C801 Malignant (primary) neoplasm, unspecified: Secondary | ICD-10-CM

## 2019-01-19 MED ORDER — NERATINIB MALEATE 40 MG PO TABS: 240.0000 mg | ORAL_TABLET | Freq: Every day | ORAL | 3 refills | Status: DC

## 2019-01-30 ENCOUNTER — Telehealth: Payer: Self-pay | Admitting: *Deleted

## 2019-01-30 ENCOUNTER — Inpatient Hospital Stay: Payer: BLUE CROSS/BLUE SHIELD | Attending: Hematology and Oncology | Admitting: Hematology and Oncology

## 2019-01-30 ENCOUNTER — Inpatient Hospital Stay: Payer: BLUE CROSS/BLUE SHIELD

## 2019-01-30 ENCOUNTER — Other Ambulatory Visit: Payer: Self-pay | Admitting: *Deleted

## 2019-01-30 DIAGNOSIS — R197 Diarrhea, unspecified: Secondary | ICD-10-CM | POA: Insufficient documentation

## 2019-01-30 DIAGNOSIS — Z170 Estrogen receptor positive status [ER+]: Principal | ICD-10-CM

## 2019-01-30 DIAGNOSIS — Z79811 Long term (current) use of aromatase inhibitors: Secondary | ICD-10-CM | POA: Diagnosis not present

## 2019-01-30 DIAGNOSIS — C773 Secondary and unspecified malignant neoplasm of axilla and upper limb lymph nodes: Secondary | ICD-10-CM

## 2019-01-30 DIAGNOSIS — C50912 Malignant neoplasm of unspecified site of left female breast: Secondary | ICD-10-CM

## 2019-01-30 DIAGNOSIS — C50612 Malignant neoplasm of axillary tail of left female breast: Secondary | ICD-10-CM

## 2019-01-30 LAB — CBC WITH DIFFERENTIAL (CANCER CENTER ONLY)
Abs Immature Granulocytes: 0.02 10*3/uL (ref 0.00–0.07)
Basophils Absolute: 0 10*3/uL (ref 0.0–0.1)
Basophils Relative: 1 %
Eosinophils Absolute: 0.1 10*3/uL (ref 0.0–0.5)
Eosinophils Relative: 1 %
HCT: 39.6 % (ref 36.0–46.0)
Hemoglobin: 13.2 g/dL (ref 12.0–15.0)
IMMATURE GRANULOCYTES: 0 %
Lymphocytes Relative: 25 %
Lymphs Abs: 1.4 10*3/uL (ref 0.7–4.0)
MCH: 29.8 pg (ref 26.0–34.0)
MCHC: 33.3 g/dL (ref 30.0–36.0)
MCV: 89.4 fL (ref 80.0–100.0)
Monocytes Absolute: 0.4 10*3/uL (ref 0.1–1.0)
Monocytes Relative: 8 %
Neutro Abs: 3.7 10*3/uL (ref 1.7–7.7)
Neutrophils Relative %: 65 %
Platelet Count: 283 10*3/uL (ref 150–400)
RBC: 4.43 MIL/uL (ref 3.87–5.11)
RDW: 13.2 % (ref 11.5–15.5)
WBC Count: 5.7 10*3/uL (ref 4.0–10.5)
nRBC: 0 % (ref 0.0–0.2)

## 2019-01-30 LAB — CMP (CANCER CENTER ONLY)
ALBUMIN: 4.5 g/dL (ref 3.5–5.0)
ALT: 16 U/L (ref 0–44)
AST: 21 U/L (ref 15–41)
Alkaline Phosphatase: 93 U/L (ref 38–126)
Anion gap: 13 (ref 5–15)
BILIRUBIN TOTAL: 0.5 mg/dL (ref 0.3–1.2)
BUN: 20 mg/dL (ref 6–20)
CO2: 26 mmol/L (ref 22–32)
Calcium: 9.9 mg/dL (ref 8.9–10.3)
Chloride: 101 mmol/L (ref 98–111)
Creatinine: 1.04 mg/dL — ABNORMAL HIGH (ref 0.44–1.00)
GFR, Est AFR Am: >60 mL/min (ref >60)
GFR, Estimated: 59 mL/min — ABNORMAL LOW (ref >60)
GLUCOSE: 96 mg/dL (ref 70–99)
Potassium: 4 mmol/L (ref 3.5–5.1)
Sodium: 140 mmol/L (ref 135–145)
TOTAL PROTEIN: 7.7 g/dL (ref 6.5–8.1)

## 2019-01-30 MED ORDER — METHYLPREDNISOLONE 4 MG PO TBPK: ORAL_TABLET | ORAL | 0 refills | Status: DC

## 2019-01-30 NOTE — Assessment & Plan Note (Signed)
04/14/2017 Left axillary lymph node biopsy: Metastatic carcinoma positive for CK 7, ER positive,GATA-3 equivocal, negative for CK 20,GCDFP, CDX-2, TTF-1; HER-2 positive ratio 2.08 mammogram and ultrasound revealed 3 discrete abnormal left axillary lymph nodes largest measures 1.8 cm Clinical stage: TX N1MX  Treatment plan: 1. Neoadjuvant TCH Perjeta 6 cycles completed 08/31/2017 followed by Herceptin, Perjeta maintenance for 1 year 2. followed by axillary lymph node dissection. Breast surgery will not be performed because there is no primary tumor identifiable on breast MRI: 10/14/17: 0/13 LN Negative 3. Followed by radiation 12/08/2017-01/12/2018 4. Followed by antiestrogen therapy started 01/30/2018 5.Herceptin Perjeta maintenance completed 05/10/2018 6.Neratinib started 05/13/2018 ------------------------------------------------------------------------------------------------------------------ Current Treatment:Anastrozole with Neratinib  Neratinibtoxicities: 1.Diarrhea especially at nightshe completed budesonide therapy. She is currently on Imodium and Lomotil.She is taking Lomotil twice a day and then Imodium in the middle of the day.This is been controlling her diarrhea. 2.  Fixed drug eruption: Maculopapular rash generalized throughout of the body including arms and legs and left flank: Plan: Medrol Dosepak, topical cortisone, stop neratinib for 2 weeks.  If the rash resolves then she will resume neratinib at 4 tablets a day and will increase to 6 tablets if she tolerates it.  She will also resume Xyzal as previously she was taking and she discontinued prior to the development of the rash.    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  Letrozole toxicities:Hot flashes.  This can be moderate in intensity Insomnia: On Ambien Surveillance: Mammogram will be done after Apr 21, 2019

## 2019-01-30 NOTE — Progress Notes (Signed)
Patient Care Team: Deland Pretty, MD as PCP - General (Internal Medicine)  DIAGNOSIS:  Encounter Diagnosis  Name Primary?  . 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[place])   04/14/2017 Initial Diagnosis    Left axillary lymph node biopsy: Metastatic carcinoma positive for CK 7, ER positive,GATA-3 equivocal, negative for CK 20,GCDFP, CDX-2, TTF-1; HER-2 positive ratio 2.08; mammogram and ultrasound revealed 3 discrete abnormal left axillary lymph nodes largest measures 1.8 cm    04/23/2017 Breast MRI    Left axillary lymphadenopathy numerous enlarged level I axillary lymph nodes largest 2.1 cm, no other abdominal masses in the left breast itself, no MRI evidence of malignancy in the right breast    04/26/2017 Imaging    CT CAP: Prominent left axillary lymph nodes no distant metastases, sclerotic focus left pedicle of T9 vertebra favored benign, T5 vertebral hemangioma    05/12/2017 - 08/31/2017 Neo-Adjuvant Chemotherapy    TCH Perjeta x6 followed by Herceptin Perjeta maintenance for 1 year    10/14/2017 Surgery    Left axillary lymph node dissection: 0/13 lymph nodes complete pathologic response.  Breast was not operated on because no breast primary was ever identified    12/09/2017 - 01/11/2018 Radiation Therapy    Adjuvant radiation therapy    01/30/2018 -  Anti-estrogen oral therapy    Anastrozole 1 mg daily, Neratinib started 05/13/2018    09/09/2018 Genetic Testing    The MyRisk gene panel offered by [employer] includes sequencing and deletion/duplication testing of the following 35 genes: APC, ATM, BARD1, BMPR1A, BRCA1, BRCA2, BRIP1, CHD1, CDK4, CDKN2A, CHEK2, EPCAM (large rearrangement only), GREM1, HOXB13, AXIN2, MSH3, NTHL1, RNF43, GALNT12, RSP20, MLH1, MSH2, MSH6, MUTYH, NBN, PALB2, PMS2, PTEN, RAD51C,  RAD51D, SMAD4, STK11, and TP53. Sequencing was performed for select regions of POLE and POLD1, and large rearrangement analysis was performed for select regions of GREM1.  Results: Negative, no pathogenic variants identified.  The date of this test report is 09/09/2018.     CHIEF COMPLIANT: Follow-up on maculopapular skin eruption  INTERVAL HISTORY: Melissa [name] is a 1-year with above-mentioned history of ER positive HER-2 positive breast cancer who is currently on anastrozole with the neratinib.  She has noticed a rash on her left leg for about a month.  It significantly gotten worse and not only that it spread to the rest of the body and she has areas of maculopapular rash on both her arms legs as well as left flank.  She was taking Xyzal previous to this and had discontinued it and since then these rashes have appeared.  REVIEW OF SYSTEMS:   Constitutional: Denies fevers, chills or abnormal weight loss Eyes: Denies blurriness of vision Ears, nose, mouth, throat, and face: Denies mucositis or sore throat Respiratory: Denies cough, dyspnea or wheezes Cardiovascular: Denies palpitation, chest discomfort Gastrointestinal:  Denies nausea, heartburn or change in bowel habits Skin: Maculopapular skin rash Lymphatics: Denies new lymphadenopathy or easy bruising Neurological:Denies numbness, tingling or new weaknesses Behavioral/Psych: Mood is stable, no new changes  Extremities: No lower extremity edema  All other systems were reviewed with the patient and are negative.  I have reviewed the past medical history, past surgical history, social history and family history with the patient and they are unchanged from previous note.  ALLERGIES:  has No Known Allergies.  MEDICATIONS:  Current Outpatient Medications  Medication Sig Dispense Refill  . anastrozole (ARIMIDEX) 1 MG tablet Take 1 tablet (1 mg total) by mouth daily. 90 tablet 3  . calcium carbonate (OS-CAL) 1250 (500 Ca) MG chewable  tablet Chew 1 tablet by mouth daily. Calcium 600 mg    . cholecalciferol (VITAMIN D) 1000 units tablet Take 1,000 Units by mouth daily. 2000 iu    . diphenoxylate-atropine (LOMOTIL) 2.5-0.025 MG tablet Take 2 tablets by mouth 4 (four) times daily as needed for diarrhea or loose stools. 120 tablet 6  . levocetirizine (XYZAL) 5 MG tablet Take 5 mg by mouth every evening.    . loperamide (IMODIUM) 2 MG capsule Take 2 capsules (4 mg total) by mouth 3 (three) times daily as needed for diarrhea or loose stools. 120 capsule 6  . methylPREDNISolone (MEDROL DOSEPAK) 4 MG TBPK tablet Use as directed 21 tablet 0  . Neratinib Maleate ([name]) 40 MG tablet Take 6 tablets (240 mg total) by mouth daily. Take with food. 180 tablet 3  . rosuvastatin (CRESTOR) 10 MG tablet Take 10 mg by mouth daily.   0  . triamterene-hydrochlorothiazide (MAXZIDE-25) 37.5-25 MG tablet Take 1 tablet by mouth daily.    [place] Kitchen zolpidem (AMBIEN) 5 MG tablet Take 1 tablet (5 mg total) by mouth at bedtime as needed. for sleep 90 tablet 3   No current facility-administered medications for this visit.     PHYSICAL EXAMINATION: ECOG PERFORMANCE STATUS: 1 - Symptomatic but completely ambulatory  Vitals:   01/30/19 1145  BP: 126/80  Pulse: 78  Resp: 18  Temp: 98.5 F (36.9 C)  SpO2: 100%   Filed Weights   01/30/19 1145  Weight: 141 lb 1.6 oz (64 kg)    GENERAL:alert, no distress and comfortable SKIN: Maculopapular skin rash EYES: normal, Conjunctiva are pink and non-injected, sclera clear OROPHARYNX:no exudate, no erythema and lips, buccal mucosa, and tongue normal  NECK: supple, thyroid normal size, non-tender, without nodularity LYMPH:  no palpable lymphadenopathy in the cervical, axillary or inguinal LUNGS: clear to auscultation and percussion with normal breathing effort HEART: regular rate & rhythm and no murmurs and no lower extremity edema ABDOMEN:abdomen soft, non-tender and normal bowel sounds MUSCULOSKELETAL:no  cyanosis of digits and no clubbing  NEURO: alert & oriented x 3 with fluent speech, no focal motor/sensory deficits EXTREMITIES: No lower extremity edema       LABORATORY DATA:  I have reviewed the data as listed CMP Latest Ref Rng & Units 01/30/2019 12/01/2018 09/27/2018  Glucose 70 - 99 mg/dL 96 93 94  BUN 6 - 20 mg/dL _0 Creatinine 0.44 - 1.00 mg/dL 1.04(H) 1.00 1.01(H)  Sodium 135 - 145 mmol/L 140 141 140  Potassium 3.5 - 5.1 mmol/L 4.0 4.1 4.4  Chloride 98 - 111 mmol/L 101 105 106  CO2 22 - 32 mmol/L _1 Calcium 8.9 - 10.3 mg/dL 9.9 9.8 9.6  Total Protein 6.5 - 8.1 g/dL 7.7 6.9 6.7  Total Bilirubin 0.3 - 1.2 mg/dL 0.5 0.3 0.5  Alkaline Phos 38 - 126 U/L 93 97 96  AST 15 - 41 U/L _2 ALT 0 - 44 U/L _3 Lab Results  Component Value Date   WBC 5.7 01/30/2019   HGB 13.2 01/30/2019   HCT 39.6 01/30/2019   MCV 89.4 01/30/2019   PLT 283 01/30/2019   NEUTROABS 3.7 01/30/2019    ASSESSMENT & PLAN:  Secondary and unspecified malignant  neoplasm of axilla and upper limb lymph nodes ([place]) 04/14/2017 Left axillary lymph node biopsy: Metastatic carcinoma positive for CK 7, ER positive,GATA-3 equivocal, negative for CK 20,GCDFP, CDX-2, TTF-1; HER-2 positive ratio 2.08 mammogram and ultrasound revealed 3 discrete abnormal left axillary lymph nodes largest measures 1.8 cm Clinical stage: [place] N1MX  Treatment plan: 1. Neoadjuvant TCH Perjeta 6 cycles completed 08/31/2017 followed by Herceptin, Perjeta maintenance for 1 year 2. followed by axillary lymph node dissection. Breast surgery will not be performed because there is no primary tumor identifiable on breast MRI: 10/14/17: 0/13 LN Negative 3. Followed by radiation 12/08/2017-01/12/2018 4. Followed by antiestrogen therapy started 01/30/2018 5.Herceptin Perjeta maintenance completed 05/10/2018 6.Neratinib started  05/13/2018 ------------------------------------------------------------------------------------------------------------------ Current Treatment:Anastrozole with Neratinib  Neratinibtoxicities: 1.Diarrhea especially at nightshe completed budesonide therapy. She is currently on Imodium and Lomotil.She is taking Lomotil twice a day and then Imodium in the middle of the day.This is been controlling her diarrhea. 2.  Fixed drug eruption: Maculopapular rash generalized throughout of the body including arms and legs and left flank: Plan: Medrol Dosepak, topical cortisone, stop neratinib for 2 weeks.  If the rash resolves then she will resume neratinib at 4 tablets a day and will increase to 6 tablets if she tolerates it.  She will also resume Xyzal as previously she was taking and she discontinued prior to the development of the rash.    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  Letrozole toxicities:Hot flashes.  This can be moderate in intensity Insomnia: On Ambien Surveillance: Mammogram will be done after Apr 21, 2019  Return to clinic in March as previously scheduled.  No orders of the defined types were placed in this encounter.  The patient has a good understanding of the overall plan. she agrees with it. she will call with any problems that may develop before the next visit here.   Harriette Ohara, MD 01/30/19

## 2019-01-30 NOTE — Telephone Encounter (Signed)
Received call from patient stating she has a rash on her left leg, both arms, an area on her chest and back that itches.  She has had a small area around her ankle that has been there about a month.  She is questioning whether this could be a delayed reaction to the nerlynx.  Appt made for labs and see Dr. Lindi Adie today.

## 2019-02-15 ENCOUNTER — Telehealth: Payer: Self-pay

## 2019-02-15 NOTE — Telephone Encounter (Signed)
Pt called to notify Dr.Gudena that she had finished her medrol dose pack for her current rash. She was supposed to restart on nerlynx, however, she is still having some mild rash and itching on her bilateral arms. Pt taking topical cortisone cream. She states 90% improvement of rash since taking steroid dose pack. She was off [name] x 2 weeks. Pt is going out of town tomorrow and would like to know if she can wait to take nerlynx until Monday next week.   Suggested that pt take benadryl for the itching , with the topical cortisone cream. She may apply a cool pack on rash if needed. Pt to avoid any body lotion, soap, products that could irritate rash. Will notify Dr.Gudena and let him know pt will not restart until rash is completely resolved. Pt verbalized understanding and very thankful for the call.

## 2019-02-23 ENCOUNTER — Other Ambulatory Visit: Payer: Self-pay | Admitting: *Deleted

## 2019-02-23 MED ORDER — METHYLPREDNISOLONE 4 MG PO TBPK: ORAL_TABLET | ORAL | 0 refills | Status: DC

## 2019-02-23 NOTE — Telephone Encounter (Signed)
Received call from patient stating she now has a rash that looks like hives on her arms, back, and shoulder.  She has not restarted the nerlynx.  Per Dr. Lindi Adie we will call in another medrol dose pak. She will see Dr. Lindi Adie next week.

## 2019-02-28 NOTE — Progress Notes (Signed)
Patient Care Team: Deland Pretty, MD as PCP - General (Internal Medicine)  DIAGNOSIS:    ICD-10-CM   1. 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[place])   04/14/2017 Initial Diagnosis    Left axillary lymph node biopsy: Metastatic carcinoma positive for CK 7, ER positive,GATA-3 equivocal, negative for CK 20,GCDFP, CDX-2, TTF-1; HER-2 positive ratio 2.08; mammogram and ultrasound revealed 3 discrete abnormal left axillary lymph nodes largest measures 1.8 cm    04/23/2017 Breast MRI    Left axillary lymphadenopathy numerous enlarged level I axillary lymph nodes largest 2.1 cm, no other abdominal masses in the left breast itself, no MRI evidence of malignancy in the right breast    04/26/2017 Imaging    CT CAP: Prominent left axillary lymph nodes no distant metastases, sclerotic focus left pedicle of T9 vertebra favored benign, T5 vertebral hemangioma    05/12/2017 - 08/31/2017 Neo-Adjuvant Chemotherapy    TCH Perjeta x6 followed by Herceptin Perjeta maintenance for 1 year    10/14/2017 Surgery    Left axillary lymph node dissection: 0/13 lymph nodes complete pathologic response.  Breast was not operated on because no breast primary was ever identified    12/09/2017 - 01/11/2018 Radiation Therapy    Adjuvant radiation therapy    01/30/2018 -  Anti-estrogen oral therapy    Anastrozole 1 mg daily, Neratinib started 05/13/2018    09/09/2018 Genetic Testing    The MyRisk gene panel offered by [employer] includes sequencing and deletion/duplication testing of the following 35 genes: APC, ATM, BARD1, BMPR1A, BRCA1, BRCA2, BRIP1, CHD1, CDK4, CDKN2A, CHEK2, EPCAM (large rearrangement only), GREM1, HOXB13, AXIN2, MSH3, NTHL1, RNF43, GALNT12, RSP20, MLH1, MSH2, MSH6, MUTYH, NBN, PALB2, PMS2, PTEN, RAD51C, RAD51D, SMAD4,  STK11, and TP53. Sequencing was performed for select regions of POLE and POLD1, and large rearrangement analysis was performed for select regions of GREM1.  Results: Negative, no pathogenic variants identified.  The date of this test report is 09/09/2018.     CHIEF COMPLIANT: Follow-up of anastrozole with neratinib and maculopapular skin eruption  INTERVAL HISTORY: Melissa [name] is a 58 y.o. with above-mentioned history of HER-2 positive breast cancer who is currently on anastrozole with the neratinib. She developed a rash that began on her left leg and spread to the rest of her body several weeks ago and has been on a medrol dosepak and using topical cortisone. She presents to the clinic alone today and finished her second medrol dosepak yesterday and has been using lotion and cortisone cream. Her rash has improved significantly and is no longer itchy but is still present on her legs, arms, and back. She is very motivated to begin treatment again with neratnib. Her labs from today are all WNL. She is concerned about her susceptibility to coronavirus due to her daughter who is a Marine scientist.   REVIEW OF SYSTEMS:   Constitutional: Denies fevers, chills or abnormal weight loss Eyes: Denies blurriness of vision Ears, nose, mouth, throat, and face: Denies mucositis or sore throat Respiratory: Denies cough, dyspnea or wheezes Cardiovascular: Denies palpitation, chest discomfort Gastrointestinal: Denies nausea, heartburn or change in bowel habits Skin: (+) rash on arms, legs, and back, the rash on the leg has nearly resolved Lymphatics: Denies new lymphadenopathy or easy bruising Neurological: Denies numbness, tingling or new weaknesses Behavioral/Psych: Mood is stable, no  new changes  Extremities: No lower extremity edema Breast: denies any pain or lumps or nodules in either breasts All other systems were reviewed with the patient and are negative.  I have reviewed the past medical history, past  surgical history, social history and family history with the patient and they are unchanged from previous note.  ALLERGIES:  has No Known Allergies.  MEDICATIONS:  Current Outpatient Medications  Medication Sig Dispense Refill  . anastrozole (ARIMIDEX) 1 MG tablet Take 1 tablet (1 mg total) by mouth daily. 90 tablet 3  . calcium carbonate (OS-CAL) 1250 (500 Ca) MG chewable tablet Chew 1 tablet by mouth daily. Calcium 600 mg    . cholecalciferol (VITAMIN D) 1000 units tablet Take 1,000 Units by mouth daily. 2000 iu    . diphenoxylate-atropine (LOMOTIL) 2.5-0.025 MG tablet Take 2 tablets by mouth 4 (four) times daily as needed for diarrhea or loose stools. 120 tablet 6  . levocetirizine (XYZAL) 5 MG tablet Take 5 mg by mouth every evening.    . loperamide (IMODIUM) 2 MG capsule Take 2 capsules (4 mg total) by mouth 3 (three) times daily as needed for diarrhea or loose stools. 120 capsule 6  . methylPREDNISolone (MEDROL DOSEPAK) 4 MG TBPK tablet Use as directed 21 tablet 0  . Neratinib Maleate ([name]) 40 MG tablet Take 6 tablets (240 mg total) by mouth daily. Take with food. 180 tablet 3  . rosuvastatin (CRESTOR) 10 MG tablet Take 10 mg by mouth daily.   0  . triamterene-hydrochlorothiazide (MAXZIDE-25) 37.5-25 MG tablet Take 1 tablet by mouth daily.    Marland Kitchen zolpidem (AMBIEN) 5 MG tablet Take 1 tablet (5 mg total) by mouth at bedtime as needed. for sleep 90 tablet 3   No current facility-administered medications for this visit.     PHYSICAL EXAMINATION: ECOG PERFORMANCE STATUS: 1 - Symptomatic but completely ambulatory  Vitals:   03/01/19 1047  BP: 131/78  Pulse: 67  Resp: 18  Temp: 98.5 F (36.9 C)  SpO2: 100%   Filed Weights   03/01/19 1047  Weight: 141 lb 4.8 oz (64.1 kg)    GENERAL: alert, no distress and comfortable SKIN: skin color, texture, turgor are normal, no rashes or significant lesions EYES: normal, Conjunctiva are pink and non-injected, sclera clear OROPHARYNX: no  exudate, no erythema and lips, buccal mucosa, and tongue normal  NECK: supple, thyroid normal size, non-tender, without nodularity LYMPH: no palpable lymphadenopathy in the cervical, axillary or inguinal LUNGS: clear to auscultation and percussion with normal breathing effort HEART: regular rate & rhythm and no murmurs and no lower extremity edema ABDOMEN: abdomen soft, non-tender and normal bowel sounds MUSCULOSKELETAL: no cyanosis of digits and no clubbing  NEURO: alert & oriented x 3 with fluent speech, no focal motor/sensory deficits EXTREMITIES: Rash has improved significantly     LABORATORY DATA:  I have reviewed the data as listed CMP Latest Ref Rng & Units 03/01/2019 01/30/2019 12/01/2018  Glucose 70 - 99 mg/dL 87 96 93  BUN 6 - 20 mg/dL 22(H) 20 17  Creatinine 0.44 - 1.00 mg/dL 0.85 1.04(H) 1.00  Sodium 135 - 145 mmol/L 139 140 141  Potassium 3.5 - 5.1 mmol/L 3.5 4.0 4.1  Chloride 98 - 111 mmol/L 98 101 105  CO2 22 - 32 mmol/L _0 Calcium 8.9 - 10.3 mg/dL 9.6 9.9 9.8  Total Protein 6.5 - 8.1 g/dL 7.6 7.7 6.9  Total Bilirubin 0.3 - 1.2 mg/dL 0.4 0.5 0.3  Alkaline Phos  38 - 126 U/L 89 93 97  AST 15 - 41 U/L _0 ALT 0 - 44 U/L _1 Lab Results  Component Value Date   WBC 6.1 03/01/2019   HGB 14.6 03/01/2019   HCT 43.7 03/01/2019   MCV 90.7 03/01/2019   PLT 352 03/01/2019   NEUTROABS 2.9 03/01/2019    ASSESSMENT & PLAN:  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 Neoadjuvant TCH Perjeta 6 cycles completed 08/31/2017 followed by Herceptin, Perjeta maintenance for 1 year 2.  followed by axillary lymph node dissection. Breast surgery will not be performed because there is no primary tumor identifiable on breast MRI: 10/14/17: 0/13 LN Negative 3. Followed by radiation 12/08/2017-01/12/2018 4. Followed by antiestrogen therapy started 01/30/2018 5.Herceptin Perjeta maintenance completed 05/10/2018 6.Neratinib started 05/13/2018 ------------------------------------------------------------------------------------------------------------------ Current Treatment:Anastrozole with Neratinib  Neratinibtoxicities: 1.Diarrhea especially at nightshe completed budesonide therapy. She is currently on Imodium and Lomotil.She is taking Lomotil twice a day and then Imodium in the middle of the day.This is been controlling her diarrhea. 2.  Fixed drug eruption: Maculopapular rash generalized throughout of the body including arms and legs and left flank: Responded very well to steroids x2.  Plan: Patient will restart neratinib.  She will start at 3 tablets daily and then slowly increase it up to 6 tablets. If the rash comes back then we will have to discontinue neratinib treatment permanently.  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  Letrozole toxicities:Hot flashes.This can be moderate in intensity Insomnia: On Ambien Surveillance: Mammogram will be done after Apr 21, 2019  We will make a virtual visit in 1 month and assess her symptoms.  I instructed her to call us if she develops any problems.    No orders of the defined types were placed in this encounter.  The patient has a good understanding of the overall plan. she agrees with it. she will call with any problems that may develop before the next visit here.  Nicholas Lose, MD 03/01/2019  Julious Oka Dorshimer am acting as scribe for Dr. Nicholas Lose.  I have reviewed the above documentation for accuracy and completeness, and I agree with the above.

## 2019-03-01 ENCOUNTER — Inpatient Hospital Stay: Payer: BLUE CROSS/BLUE SHIELD | Attending: Hematology and Oncology

## 2019-03-01 ENCOUNTER — Inpatient Hospital Stay (HOSPITAL_BASED_OUTPATIENT_CLINIC_OR_DEPARTMENT_OTHER): Payer: BLUE CROSS/BLUE SHIELD | Admitting: Hematology and Oncology

## 2019-03-01 ENCOUNTER — Other Ambulatory Visit: Payer: Self-pay

## 2019-03-01 DIAGNOSIS — C773 Secondary and unspecified malignant neoplasm of axilla and upper limb lymph nodes: Secondary | ICD-10-CM | POA: Diagnosis not present

## 2019-03-01 DIAGNOSIS — C50912 Malignant neoplasm of unspecified site of left female breast: Secondary | ICD-10-CM | POA: Diagnosis present

## 2019-03-01 DIAGNOSIS — Z923 Personal history of irradiation: Secondary | ICD-10-CM | POA: Insufficient documentation

## 2019-03-01 DIAGNOSIS — G620 Drug-induced polyneuropathy: Secondary | ICD-10-CM | POA: Insufficient documentation

## 2019-03-01 DIAGNOSIS — Z79811 Long term (current) use of aromatase inhibitors: Secondary | ICD-10-CM

## 2019-03-01 DIAGNOSIS — Z170 Estrogen receptor positive status [ER+]: Secondary | ICD-10-CM | POA: Diagnosis not present

## 2019-03-01 LAB — CMP (CANCER CENTER ONLY)
ALT: 17 U/L (ref 0–44)
AST: 15 U/L (ref 15–41)
Albumin: 4.1 g/dL (ref 3.5–5.0)
Alkaline Phosphatase: 89 U/L (ref 38–126)
Anion gap: 11 (ref 5–15)
BUN: 22 mg/dL — ABNORMAL HIGH (ref 6–20)
CO2: 30 mmol/L (ref 22–32)
Calcium: 9.6 mg/dL (ref 8.9–10.3)
Chloride: 98 mmol/L (ref 98–111)
Creatinine: 0.85 mg/dL (ref 0.44–1.00)
GFR, Estimated: >60 mL/min (ref >60)
Glucose, Bld: 87 mg/dL (ref 70–99)
Potassium: 3.5 mmol/L (ref 3.5–5.1)
Sodium: 139 mmol/L (ref 135–145)
TOTAL PROTEIN: 7.6 g/dL (ref 6.5–8.1)
Total Bilirubin: 0.4 mg/dL (ref 0.3–1.2)

## 2019-03-01 LAB — CBC WITH DIFFERENTIAL (CANCER CENTER ONLY)
Abs Immature Granulocytes: 0.12 10*3/uL — ABNORMAL HIGH (ref 0.00–0.07)
BASOS ABS: 0.1 10*3/uL (ref 0.0–0.1)
Basophils Relative: 1 %
EOS ABS: 0.1 10*3/uL (ref 0.0–0.5)
Eosinophils Relative: 1 %
HCT: 43.7 % (ref 36.0–46.0)
Hemoglobin: 14.6 g/dL (ref 12.0–15.0)
Immature Granulocytes: 2 %
Lymphocytes Relative: 41 %
Lymphs Abs: 2.5 10*3/uL (ref 0.7–4.0)
MCH: 30.3 pg (ref 26.0–34.0)
MCHC: 33.4 g/dL (ref 30.0–36.0)
MCV: 90.7 fL (ref 80.0–100.0)
Monocytes Absolute: 0.4 10*3/uL (ref 0.1–1.0)
Monocytes Relative: 7 %
Neutro Abs: 2.9 10*3/uL (ref 1.7–7.7)
Neutrophils Relative %: 48 %
PLATELETS: 352 10*3/uL (ref 150–400)
RBC: 4.82 MIL/uL (ref 3.87–5.11)
RDW: 13.4 % (ref 11.5–15.5)
WBC Count: 6.1 10*3/uL (ref 4.0–10.5)
nRBC: 0 % (ref 0.0–0.2)

## 2019-03-01 NOTE — Assessment & Plan Note (Signed)
04/14/2017 Left axillary lymph node biopsy: Metastatic carcinoma positive for CK 7, ER positive,GATA-3 equivocal, negative for CK 20,GCDFP, CDX-2, TTF-1; HER-2 positive ratio 2.08 mammogram and ultrasound revealed 3 discrete abnormal left axillary lymph nodes largest measures 1.8 cm Clinical stage: TX N1MX  Treatment plan: 1. Neoadjuvant TCH Perjeta 6 cycles completed 08/31/2017 followed by Herceptin, Perjeta maintenance for 1 year 2. followed by axillary lymph node dissection. Breast surgery will not be performed because there is no primary tumor identifiable on breast MRI: 10/14/17: 0/13 LN Negative 3. Followed by radiation 12/08/2017-01/12/2018 4. Followed by antiestrogen therapy started 01/30/2018 5.Herceptin Perjeta maintenance completed 05/10/2018 6.Neratinib started 05/13/2018 ------------------------------------------------------------------------------------------------------------------ Current Treatment:Anastrozole with Neratinib  Neratinibtoxicities: 1.Diarrhea especially at nightshe completed budesonide therapy. She is currently on Imodium and Lomotil.She is taking Lomotil twice a day and then Imodium in the middle of the day.This is been controlling her diarrhea. 2.  Fixed drug eruption: Maculopapular rash generalized throughout of the body including arms and legs and left flank: Responded very well to steroids x2.  Plan: Patient will restart neratinib.  She will start at 3 tablets daily and then slowly increase it up to 6 tablets. If the rash comes back then we will have to discontinue neratinib treatment permanently.  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  Letrozole toxicities:Hot flashes.This can be moderate in intensity Insomnia: On Ambien Surveillance: Mammogram will be done after Apr 21, 2019  We will make a virtual visit in 1 month and assess her symptoms.  I instructed her to call us if she  develops any problems. 

## 2019-03-02 ENCOUNTER — Ambulatory Visit: Payer: BLUE CROSS/BLUE SHIELD | Admitting: Hematology and Oncology

## 2019-03-02 ENCOUNTER — Other Ambulatory Visit: Payer: BLUE CROSS/BLUE SHIELD

## 2019-03-13 ENCOUNTER — Other Ambulatory Visit: Payer: Self-pay | Admitting: Hematology and Oncology

## 2019-03-13 DIAGNOSIS — Z853 Personal history of malignant neoplasm of breast: Secondary | ICD-10-CM

## 2019-03-15 ENCOUNTER — Telehealth: Payer: Self-pay | Admitting: *Deleted

## 2019-03-15 ENCOUNTER — Ambulatory Visit (HOSPITAL_BASED_OUTPATIENT_CLINIC_OR_DEPARTMENT_OTHER): Payer: 59 | Admitting: Hematology and Oncology

## 2019-03-15 DIAGNOSIS — C773 Secondary and unspecified malignant neoplasm of axilla and upper limb lymph nodes: Secondary | ICD-10-CM

## 2019-03-15 DIAGNOSIS — Z79811 Long term (current) use of aromatase inhibitors: Secondary | ICD-10-CM

## 2019-03-15 DIAGNOSIS — C801 Malignant (primary) neoplasm, unspecified: Secondary | ICD-10-CM

## 2019-03-15 DIAGNOSIS — Z923 Personal history of irradiation: Secondary | ICD-10-CM

## 2019-03-15 DIAGNOSIS — Z79899 Other long term (current) drug therapy: Secondary | ICD-10-CM

## 2019-03-15 DIAGNOSIS — Z9221 Personal history of antineoplastic chemotherapy: Secondary | ICD-10-CM

## 2019-03-15 NOTE — Progress Notes (Signed)
Patient Care Team: Deland Pretty, MD as PCP - General (Internal Medicine)  DIAGNOSIS:  Encounter Diagnosis  Name Primary?  . 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[place])    Patient location: Home Physician location: WebEx  SUMMARY OF ONCOLOGIC HISTORY:   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[place])   04/14/2017 Initial Diagnosis    Left axillary lymph node biopsy: Metastatic carcinoma positive for CK 7, ER positive,GATA-3 equivocal, negative for CK 20,GCDFP, CDX-2, TTF-1; HER-2 positive ratio 2.08; mammogram and ultrasound revealed 3 discrete abnormal left axillary lymph nodes largest measures 1.8 cm    04/23/2017 Breast MRI    Left axillary lymphadenopathy numerous enlarged level I axillary lymph nodes largest 2.1 cm, no other abdominal masses in the left breast itself, no MRI evidence of malignancy in the right breast    04/26/2017 Imaging    CT CAP: Prominent left axillary lymph nodes no distant metastases, sclerotic focus left pedicle of T9 vertebra favored benign, T5 vertebral hemangioma    05/12/2017 - 08/31/2017 Neo-Adjuvant Chemotherapy    TCH Perjeta x6 followed by Herceptin Perjeta maintenance for 1 year    10/14/2017 Surgery    Left axillary lymph node dissection: 0/13 lymph nodes complete pathologic response.  Breast was not operated on because no breast primary was ever identified    12/09/2017 - 01/11/2018 Radiation Therapy    Adjuvant radiation therapy    01/30/2018 -  Anti-estrogen oral therapy    Anastrozole 1 mg daily, Neratinib started 05/13/2018    09/09/2018 Genetic Testing    The MyRisk gene panel offered by [employer] includes sequencing and deletion/duplication testing of the following 35 genes: APC, ATM, BARD1, BMPR1A, BRCA1, BRCA2, BRIP1, CHD1, CDK4, CDKN2A, CHEK2, EPCAM (large rearrangement only), GREM1, HOXB13, AXIN2, MSH3, NTHL1, RNF43, GALNT12, RSP20, MLH1,  MSH2, MSH6, MUTYH, NBN, PALB2, PMS2, PTEN, RAD51C, RAD51D, SMAD4, STK11, and TP53. Sequencing was performed for select regions of POLE and POLD1, and large rearrangement analysis was performed for select regions of GREM1.  Results: Negative, no pathogenic variants identified.  The date of this test report is 09/09/2018.     CHIEF COMPLIANT: Follow-up to review the skin rashes that have gotten much worse  INTERVAL HISTORY: Melissa [name] is a 58 year old with above-mentioned history of breast cancer who is currently on adjuvant therapy with [name] and anastrozole.  She had profound skin rashes which we attributed to [name] and gave her Medrol dose packs x2.  The rash appears to have improved but the itching never really improved.  When she restarted her [name] it appears that the rash has gotten worse.  We are doing a WebEx visit to review the rash and to determine the treatment plan.  She has been taking Xyzal which appears to be helping slightly.  REVIEW OF SYSTEMS:   Constitutional: Denies fevers, chills or abnormal weight loss Eyes: Denies blurriness of vision Ears, nose, mouth, throat, and face: Denies mucositis or sore throat Respiratory: Denies cough, dyspnea or wheezes Cardiovascular: Denies palpitation, chest discomfort Gastrointestinal:  Denies nausea, heartburn or change in bowel habits Skin: Skin rash on both her arms elbows axilla and ankle Lymphatics: Denies new lymphadenopathy or easy bruising Neurological:Denies numbness, tingling or new weaknesses Behavioral/Psych: Mood is stable, no new changes  Extremities: No lower extremity edema  All other systems were reviewed with the patient and are negative.  I have reviewed the past medical history, past surgical history, social  history and family history with the patient and they are unchanged from previous note.  ALLERGIES:  has No Known Allergies.  MEDICATIONS:  Current Outpatient Medications  Medication Sig Dispense  Refill  . anastrozole (ARIMIDEX) 1 MG tablet Take 1 tablet (1 mg total) by mouth daily. 90 tablet 3  . calcium carbonate (OS-CAL) 1250 (500 Ca) MG chewable tablet Chew 1 tablet by mouth daily. Calcium 600 mg    . cholecalciferol (VITAMIN D) 1000 units tablet Take 1,000 Units by mouth daily. 2000 iu    . diphenoxylate-atropine (LOMOTIL) 2.5-0.025 MG tablet Take 2 tablets by mouth 4 (four) times daily as needed for diarrhea or loose stools. 120 tablet 6  . levocetirizine (XYZAL) 5 MG tablet Take 5 mg by mouth every evening.    . loperamide (IMODIUM) 2 MG capsule Take 2 capsules (4 mg total) by mouth 3 (three) times daily as needed for diarrhea or loose stools. 120 capsule 6  . methylPREDNISolone (MEDROL DOSEPAK) 4 MG TBPK tablet Use as directed 21 tablet 0  . Neratinib Maleate ([name]) 40 MG tablet Take 6 tablets (240 mg total) by mouth daily. Take with food. 180 tablet 3  . rosuvastatin (CRESTOR) 10 MG tablet Take 10 mg by mouth daily.   0  . triamterene-hydrochlorothiazide (MAXZIDE-25) 37.5-25 MG tablet Take 1 tablet by mouth daily.    Marland Kitchen zolpidem (AMBIEN) 5 MG tablet Take 1 tablet (5 mg total) by mouth at bedtime as needed. for sleep 90 tablet 3   No current facility-administered medications for this visit.     PHYSICAL EXAMINATION: ECOG PERFORMANCE STATUS: 1 - Symptomatic but completely ambulatory  There were no vitals filed for this visit. There were no vitals filed for this visit.  GENERAL:alert, no distress and comfortable SKIN: Macular rash there is at least 2 to 3 cm in diameter on her arms elbows axilla and a larger macular rash on her ankle  LABORATORY DATA:  I have reviewed the data as listed CMP Latest Ref Rng & Units 03/01/2019 01/30/2019 12/01/2018  Glucose 70 - 99 mg/dL 87 96 93  BUN 6 - 20 mg/dL 22(H) 20 17  Creatinine 0.44 - 1.00 mg/dL 0.85 1.04(H) 1.00  Sodium 135 - 145 mmol/L 139 140 141  Potassium 3.5 - 5.1 mmol/L 3.5 4.0 4.1  Chloride 98 - 111 mmol/L 98 101 105   CO2 22 - 32 mmol/L _0 Calcium 8.9 - 10.3 mg/dL 9.6 9.9 9.8  Total Protein 6.5 - 8.1 g/dL 7.6 7.7 6.9  Total Bilirubin 0.3 - 1.2 mg/dL 0.4 0.5 0.3  Alkaline Phos 38 - 126 U/L 89 93 97  AST 15 - 41 U/L _1 ALT 0 - 44 U/L _2 Lab Results  Component Value Date   WBC 6.1 03/01/2019   HGB 14.6 03/01/2019   HCT 43.7 03/01/2019   MCV 90.7 03/01/2019   PLT 352 03/01/2019   NEUTROABS 2.9 03/01/2019    ASSESSMENT & PLAN:  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[place]) 04/14/2017 Left axillary lymph node biopsy: Metastatic carcinoma positive for CK 7, ER positive,GATA-3 equivocal, negative for CK 20,GCDFP, CDX-2, TTF-1; HER-2 positive ratio 2.08 mammogram and ultrasound revealed 3 discrete abnormal left axillary lymph nodes largest measures 1.8 cm Clinical stage: TX N1MX  Treatment plan: 1. Neoadjuvant TCH Perjeta 6 cycles completed 08/31/2017 followed by Herceptin, Perjeta maintenance for 1 year 2. followed by axillary lymph node dissection. Breast  surgery will not be performed because there is no primary tumor identifiable on breast MRI: 10/14/17: 0/13 LN Negative 3. Followed by radiation 12/08/2017-01/12/2018 4. Followed by antiestrogen therapy started 01/30/2018 5.Herceptin Perjeta maintenance completed 05/10/2018 6.Neratinib started 05/13/2018 ------------------------------------------------------------------------------------------------------------------ Current Treatment:Anastrozole with Neratinib Skin rashes: On her arms and ankle as well as in the axilla This had improved slightly to 2 doses of Medrol Dosepak but since she started [name] back again the rash appears to be getting worse.  The itching never really got better in spite of stopping [name].  Clinical suspicion: It could be related to anastrozole as well as [name]. Plan: Stop anastrozole and watch for 2 weeks.  If the rash does not get better then she will stop  [name] as well. I will see her back in a month to review her symptoms and determine the treatment plan. We have seen skin rashes to antiestrogen therapy as well.  Return to clinic in 4 weeks for follow-up.      No orders of the defined types were placed in this encounter.  The patient has a good understanding of the overall plan. she agrees with it. she will call with any problems that may develop before the next visit here.   Harriette Ohara, MD 03/15/19

## 2019-03-15 NOTE — Telephone Encounter (Signed)
Received call from patient that her rash is back on both arms and the right ankle that had healed now is back.  This is after being back on nerlynx for 2 weeks. Per Dr. Lindi Adie he will call her.

## 2019-03-15 NOTE — Assessment & Plan Note (Signed)
04/14/2017 Left axillary lymph node biopsy: Metastatic carcinoma positive for CK 7, ER positive,GATA-3 equivocal, negative for CK 20,GCDFP, CDX-2, TTF-1; HER-2 positive ratio 2.08 mammogram and ultrasound revealed 3 discrete abnormal left axillary lymph nodes largest measures 1.8 cm Clinical stage: TX N1MX  Treatment plan: 1. Neoadjuvant TCH Perjeta 6 cycles completed 08/31/2017 followed by Herceptin, Perjeta maintenance for 1 year 2. followed by axillary lymph node dissection. Breast surgery will not be performed because there is no primary tumor identifiable on breast MRI: 10/14/17: 0/13 LN Negative 3. Followed by radiation 12/08/2017-01/12/2018 4. Followed by antiestrogen therapy started 01/30/2018 5.Herceptin Perjeta maintenance completed 05/10/2018 6.Neratinib started 05/13/2018 ------------------------------------------------------------------------------------------------------------------ Current Treatment:Anastrozole with Neratinib Skin rashes: On her arms and ankle as well as in the axilla This had improved slightly to 2 doses of Medrol Dosepak but since she started [name] back again the rash appears to be getting worse.  The itching never really got better in spite of stopping [name].  Clinical suspicion: It could be related to anastrozole as well as [name]. Plan: Stop anastrozole and watch for 2 weeks.  If the rash does not get better then she will stop [name] as well. I will see her back in a month to review her symptoms and determine the treatment plan. We have seen skin rashes to antiestrogen therapy as well.  Return to clinic in 4 weeks for follow-up.

## 2019-04-02 NOTE — Progress Notes (Signed)
HEMATOLOGY-ONCOLOGY [facility] VISIT PROGRESS NOTE  I connected with Melissa [name] on 04/03/2019 at 10:15 AM EDT by Webex video conference and verified that I am speaking with the correct person using two identifiers.  I discussed the limitations, risks, security and privacy concerns of performing an evaluation and management service by Webex and the availability of in person appointments.  I also discussed with the patient that there may be a patient responsible charge related to this service. The patient expressed understanding and agreed to proceed.  Patient's Location: Home Physician Location: Clinic  CHIEF COMPLIANT: Follow-up of skin rashes  INTERVAL HISTORY: Melissa [name] is a 58 y.o. with above-mentioned history of breast cancer who is currently on adjuvant therapy with [name] and anastrozole. She had profound skin rashes on her arms, ankles, and axilla that worsened when taking [name], and stopped anastrozole 2 weeks ago to determine if it is related to anastrozole. She presents today over Webex to review the rash and determine a further treatment plan.     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[place])   04/14/2017 Initial Diagnosis    Left axillary lymph node biopsy: Metastatic carcinoma positive for CK 7, ER positive,GATA-3 equivocal, negative for CK 20,GCDFP, CDX-2, TTF-1; HER-2 positive ratio 2.08; mammogram and ultrasound revealed 3 discrete abnormal left axillary lymph nodes largest measures 1.8 cm    04/23/2017 Breast MRI    Left axillary lymphadenopathy numerous enlarged level I axillary lymph nodes largest 2.1 cm, no other abdominal masses in the left breast itself, no MRI evidence of malignancy in the right breast    04/26/2017 Imaging    CT CAP: Prominent left axillary lymph nodes no distant metastases, sclerotic focus left pedicle of T9 vertebra favored benign, T5 vertebral hemangioma    05/12/2017 - 08/31/2017 Neo-Adjuvant  Chemotherapy    TCH Perjeta x6 followed by Herceptin Perjeta maintenance for 1 year    10/14/2017 Surgery    Left axillary lymph node dissection: 0/13 lymph nodes complete pathologic response.  Breast was not operated on because no breast primary was ever identified    12/09/2017 - 01/11/2018 Radiation Therapy    Adjuvant radiation therapy    01/30/2018 -  Anti-estrogen oral therapy    Anastrozole 1 mg daily, Neratinib started 05/13/2018    09/09/2018 Genetic Testing    The MyRisk gene panel offered by [employer] includes sequencing and deletion/duplication testing of the following 35 genes: APC, ATM, BARD1, BMPR1A, BRCA1, BRCA2, BRIP1, CHD1, CDK4, CDKN2A, CHEK2, EPCAM (large rearrangement only), GREM1, HOXB13, AXIN2, MSH3, NTHL1, RNF43, GALNT12, RSP20, MLH1, MSH2, MSH6, MUTYH, NBN, PALB2, PMS2, PTEN, RAD51C, RAD51D, SMAD4, STK11, and TP53. Sequencing was performed for select regions of POLE and POLD1, and large rearrangement analysis was performed for select regions of GREM1.  Results: Negative, no pathogenic variants identified.  The date of this test report is 09/09/2018.     REVIEW OF SYSTEMS:   Constitutional: Denies fevers, chills or abnormal weight loss Eyes: Denies blurriness of vision Ears, nose, mouth, throat, and face: Denies mucositis or sore throat Respiratory: Denies cough, dyspnea or wheezes Cardiovascular: Denies palpitation, chest discomfort Gastrointestinal:  Denies nausea, heartburn or change in bowel habits Skin: Denies abnormal skin rashes Lymphatics: Denies new lymphadenopathy or easy bruising Neurological:Denies numbness, tingling or new weaknesses Behavioral/Psych: Mood is stable, no new changes  Extremities: No lower extremity edema Breast: denies any pain or lumps or nodules in either breasts All other systems were reviewed with the  patient and are negative.  Observations/Objective:  There were no vitals filed for this visit. There is no height  or weight on file to calculate BMI.  I have reviewed the data as listed CMP Latest Ref Rng & Units 03/01/2019 01/30/2019 12/01/2018  Glucose 70 - 99 mg/dL 87 96 93  BUN 6 - 20 mg/dL 22(H) 20 17  Creatinine 0.44 - 1.00 mg/dL 0.85 1.04(H) 1.00  Sodium 135 - 145 mmol/L 139 140 141  Potassium 3.5 - 5.1 mmol/L 3.5 4.0 4.1  Chloride 98 - 111 mmol/L 98 101 105  CO2 22 - 32 mmol/L _0 Calcium 8.9 - 10.3 mg/dL 9.6 9.9 9.8  Total Protein 6.5 - 8.1 g/dL 7.6 7.7 6.9  Total Bilirubin 0.3 - 1.2 mg/dL 0.4 0.5 0.3  Alkaline Phos 38 - 126 U/L 89 93 97  AST 15 - 41 U/L _1 ALT 0 - 44 U/L _2 Lab Results  Component Value Date   WBC 6.1 03/01/2019   HGB 14.6 03/01/2019   HCT 43.7 03/01/2019   MCV 90.7 03/01/2019   PLT 352 03/01/2019   NEUTROABS 2.9 03/01/2019      Assessment Plan:  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 Neoadjuvant TCH Perjeta 6 cycles completed 08/31/2017 followed by Herceptin, Perjeta maintenance for 1 year 2. followed by axillary lymph node dissection. Breast surgery will not be performed because there is no primary tumor identifiable on breast MRI: 10/14/17: 0/13 LN Negative 3. Followed by radiation 12/08/2017-01/12/2018 4. Followed by antiestrogen therapy started 01/30/2018 5.Herceptin Perjeta maintenance completed 05/10/2018 6.Neratinib started 05/13/2018 ------------------------------------------------------------------------------------------------------------------ Current Treatment:Anastrozole with Neratinib Skin rashes: On her arms and ankle as well as in the axilla  Patient  discontinued anastrozole and continued neratinib. The rash continued to get worse and she finally discontinued neratinib last week. She is taking Benadryl for the itching and the rash and it appears to be helping her. We have established that the rash is coming from neratinib and she will discontinue this permanently.  If her symptoms get markedly worse then we can give her another Medrol Dosepak but at this time will hold off on that.  Return to clinic in 6 months for routine follow-up.  She has mammogram scheduled for May.    I discussed the assessment and treatment plan with the patient. The patient was provided an opportunity to ask questions and all were answered. The patient agreed with the plan and demonstrated an understanding of the instructions. The patient was advised to call back or seek an in-person evaluation if the symptoms worsen or if the condition fails to improve as anticipated.   I provided 15 minutes of face-to-face Web Ex time during this encounter.    Rulon Eisenmenger, MD 04/03/2019   I, Molly Dorshimer, am acting as scribe for Nicholas Lose, MD.  I have reviewed the above documentation for accuracy and completeness, and I agree with the above.

## 2019-04-03 ENCOUNTER — Inpatient Hospital Stay: Payer: 59 | Attending: Hematology and Oncology | Admitting: Hematology and Oncology

## 2019-04-03 DIAGNOSIS — Z79811 Long term (current) use of aromatase inhibitors: Secondary | ICD-10-CM | POA: Diagnosis not present

## 2019-04-03 DIAGNOSIS — Z923 Personal history of irradiation: Secondary | ICD-10-CM

## 2019-04-03 DIAGNOSIS — C773 Secondary and unspecified malignant neoplasm of axilla and upper limb lymph nodes: Secondary | ICD-10-CM | POA: Diagnosis not present

## 2019-04-03 DIAGNOSIS — C50912 Malignant neoplasm of unspecified site of left female breast: Secondary | ICD-10-CM

## 2019-04-03 DIAGNOSIS — Z170 Estrogen receptor positive status [ER+]: Secondary | ICD-10-CM

## 2019-04-03 NOTE — Assessment & Plan Note (Signed)
04/14/2017 Left axillary lymph node biopsy: Metastatic carcinoma positive for CK 7, ER positive,GATA-3 equivocal, negative for CK 20,GCDFP, CDX-2, TTF-1; HER-2 positive ratio 2.08 mammogram and ultrasound revealed 3 discrete abnormal left axillary lymph nodes largest measures 1.8 cm Clinical stage: TX N1MX  Treatment plan: 1. Neoadjuvant TCH Perjeta 6 cycles completed 08/31/2017 followed by Herceptin, Perjeta maintenance for 1 year 2. followed by axillary lymph node dissection. Breast surgery will not be performed because there is no primary tumor identifiable on breast MRI: 10/14/17: 0/13 LN Negative 3. Followed by radiation 12/08/2017-01/12/2018 4. Followed by antiestrogen therapy started 01/30/2018 5.Herceptin Perjeta maintenance completed 05/10/2018 6.Neratinib started 05/13/2018 ------------------------------------------------------------------------------------------------------------------ Current Treatment:Anastrozole with Neratinib Skin rashes: On her arms and ankle as well as in the axilla We had discontinued anastrozole and Neratinib for 2 weeks.

## 2019-04-04 ENCOUNTER — Encounter: Payer: Self-pay | Admitting: *Deleted

## 2019-04-13 DIAGNOSIS — E7800 Pure hypercholesterolemia, unspecified: Secondary | ICD-10-CM | POA: Diagnosis not present

## 2019-04-13 DIAGNOSIS — Z0001 Encounter for general adult medical examination with abnormal findings: Secondary | ICD-10-CM | POA: Diagnosis not present

## 2019-04-13 DIAGNOSIS — I10 Essential (primary) hypertension: Secondary | ICD-10-CM | POA: Diagnosis not present

## 2019-04-20 ENCOUNTER — Telehealth: Payer: Self-pay | Admitting: *Deleted

## 2019-04-20 DIAGNOSIS — Z0000 Encounter for general adult medical examination without abnormal findings: Secondary | ICD-10-CM | POA: Diagnosis not present

## 2019-04-20 DIAGNOSIS — R7989 Other specified abnormal findings of blood chemistry: Secondary | ICD-10-CM | POA: Diagnosis not present

## 2019-04-20 DIAGNOSIS — R21 Rash and other nonspecific skin eruption: Secondary | ICD-10-CM | POA: Diagnosis not present

## 2019-04-20 NOTE — Telephone Encounter (Signed)
Received call from patient stating she is still having a rash.  She states she has stopped the anastrozole and it is better on her arms.  Her legs however she states look bad.  She wants an appointment to see Dr. Lindi Adie to evaluate the rash.  Offered appointment today but she would prefer to come tomorrow.  Confirmed appointment for 815am 5/15.

## 2019-04-20 NOTE — Progress Notes (Signed)
Patient Care Team: Deland Pretty, MD as PCP - General (Internal Medicine)  DIAGNOSIS:    ICD-10-CM   1. 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[place])   04/14/2017 Initial Diagnosis    Left axillary lymph node biopsy: Metastatic carcinoma positive for CK 7, ER positive,GATA-3 equivocal, negative for CK 20,GCDFP, CDX-2, TTF-1; HER-2 positive ratio 2.08; mammogram and ultrasound revealed 3 discrete abnormal left axillary lymph nodes largest measures 1.8 cm    04/23/2017 Breast MRI    Left axillary lymphadenopathy numerous enlarged level I axillary lymph nodes largest 2.1 cm, no other abdominal masses in the left breast itself, no MRI evidence of malignancy in the right breast    04/26/2017 Imaging    CT CAP: Prominent left axillary lymph nodes no distant metastases, sclerotic focus left pedicle of T9 vertebra favored benign, T5 vertebral hemangioma    05/12/2017 - 08/31/2017 Neo-Adjuvant Chemotherapy    TCH Perjeta x6 followed by Herceptin Perjeta maintenance for 1 year    10/14/2017 Surgery    Left axillary lymph node dissection: 0/13 lymph nodes complete pathologic response.  Breast was not operated on because no breast primary was ever identified    12/09/2017 - 01/11/2018 Radiation Therapy    Adjuvant radiation therapy    01/30/2018 -  Anti-estrogen oral therapy    Anastrozole 1 mg daily, Neratinib started 05/13/2018    09/09/2018 Genetic Testing    The MyRisk gene panel offered by [employer] includes sequencing and deletion/duplication testing of the following 35 genes: APC, ATM, BARD1, BMPR1A, BRCA1, BRCA2, BRIP1, CHD1, CDK4, CDKN2A, CHEK2, EPCAM (large rearrangement only), GREM1, HOXB13, AXIN2, MSH3, NTHL1, RNF43, GALNT12, RSP20, MLH1, MSH2, MSH6, MUTYH, NBN, PALB2, PMS2, PTEN, RAD51C, RAD51D, SMAD4,  STK11, and TP53. Sequencing was performed for select regions of POLE and POLD1, and large rearrangement analysis was performed for select regions of GREM1.  Results: Negative, no pathogenic variants identified.  The date of this test report is 09/09/2018.     CHIEF COMPLIANT: Follow-up of skin rashes  INTERVAL HISTORY: Melissa [name] is a 58 y.o. with above-mentioned history of breast cancer who was on adjuvant therapy with [name] and anastrozole. She had profound skin rashes on her arms, ankles, and axilla that worsened when taking [name]. She stopped anastrozole and [name] with moderate improvement on her arms. She presents to the clinic today to further assess the rash that has not yet completely resolved.   REVIEW OF SYSTEMS:   Constitutional: Denies fevers, chills or abnormal weight loss Eyes: Denies blurriness of vision Ears, nose, mouth, throat, and face: Denies mucositis or sore throat Respiratory: Denies cough, dyspnea or wheezes Cardiovascular: Denies palpitation, chest discomfort Gastrointestinal: Denies nausea, heartburn or change in bowel habits Skin: Maculopapular skin rashes on the arms as well as in the left axilla and the right shin causing profound itching and discomfort Lymphatics: Denies new lymphadenopathy or easy bruising Neurological: Denies numbness, tingling or new weaknesses Behavioral/Psych: Mood is stable, no new changes  Extremities: No lower extremity edema Breast: denies any pain or lumps or nodules in either breasts All other systems were reviewed with the patient and are negative.  I have reviewed the past medical history, past surgical history, social history and family history with the patient and they are unchanged from previous note.  ALLERGIES:  has No Known  Allergies.  MEDICATIONS:  Current Outpatient Medications  Medication Sig Dispense Refill  . anastrozole (ARIMIDEX) 1 MG tablet Take 1 tablet (1 mg total) by mouth daily. 90 tablet 3  .  calcium carbonate (OS-CAL) 1250 (500 Ca) MG chewable tablet Chew 1 tablet by mouth daily. Calcium 600 mg    . cholecalciferol (VITAMIN D) 1000 units tablet Take 1,000 Units by mouth daily. 2000 iu    . diphenoxylate-atropine (LOMOTIL) 2.5-0.025 MG tablet Take 2 tablets by mouth 4 (four) times daily as needed for diarrhea or loose stools. 120 tablet 6  . levocetirizine (XYZAL) 5 MG tablet Take 5 mg by mouth every evening.    . loperamide (IMODIUM) 2 MG capsule Take 2 capsules (4 mg total) by mouth 3 (three) times daily as needed for diarrhea or loose stools. 120 capsule 6  . methylPREDNISolone (MEDROL DOSEPAK) 4 MG TBPK tablet Use as directed 21 tablet 0  . rosuvastatin (CRESTOR) 10 MG tablet Take 10 mg by mouth daily.   0  . triamterene-hydrochlorothiazide (MAXZIDE-25) 37.5-25 MG tablet Take 1 tablet by mouth daily.    Marland Kitchen zolpidem (AMBIEN) 5 MG tablet Take 1 tablet (5 mg total) by mouth at bedtime as needed. for sleep 90 tablet 3   No current facility-administered medications for this visit.     PHYSICAL EXAMINATION: ECOG PERFORMANCE STATUS: 1 - Symptomatic but completely ambulatory  Vitals:   04/21/19 0844  BP: (!) 142/77  Pulse: 76  Resp: 18  Temp: 98.7 F (37.1 C)  SpO2: 100%   Filed Weights   04/21/19 0844  Weight: 147 lb 11.2 oz (67 kg)    GENERAL: alert, no distress and comfortable SKIN: skin color, texture, turgor are normal, no rashes or significant lesions EYES: normal, Conjunctiva are pink and non-injected, sclera clear OROPHARYNX: no exudate, no erythema and lips, buccal mucosa, and tongue normal  NECK: supple, thyroid normal size, non-tender, without nodularity LYMPH: no palpable lymphadenopathy in the cervical, axillary or inguinal LUNGS: clear to auscultation and percussion with normal breathing effort HEART: regular rate & rhythm and no murmurs and no lower extremity edema ABDOMEN: abdomen soft, non-tender and normal bowel sounds MUSCULOSKELETAL: no cyanosis of  digits and no clubbing  NEURO: alert & oriented x 3 with fluent speech, no focal motor/sensory deficits EXTREMITIES: No lower extremity edema         LABORATORY DATA:  I have reviewed the data as listed CMP Latest Ref Rng & Units 03/01/2019 01/30/2019 12/01/2018  Glucose 70 - 99 mg/dL 87 96 93  BUN 6 - 20 mg/dL 22(H) 20 17  Creatinine 0.44 - 1.00 mg/dL 0.85 1.04(H) 1.00  Sodium 135 - 145 mmol/L 139 140 141  Potassium 3.5 - 5.1 mmol/L 3.5 4.0 4.1  Chloride 98 - 111 mmol/L 98 101 105  CO2 22 - 32 mmol/L 30 26 28   Calcium 8.9 - 10.3 mg/dL 9.6 9.9 9.8  Total Protein 6.5 - 8.1 g/dL 7.6 7.7 6.9  Total Bilirubin 0.3 - 1.2 mg/dL 0.4 0.5 0.3  Alkaline Phos 38 - 126 U/L 89 93 97  AST 15 - 41 U/L 15 21 19   ALT 0 - 44 U/L 17 16 16     Lab Results  Component Value Date   WBC 6.1 03/01/2019   HGB 14.6 03/01/2019   HCT 43.7 03/01/2019   MCV 90.7 03/01/2019   PLT 352 03/01/2019   NEUTROABS 2.9 03/01/2019    ASSESSMENT & PLAN:  Secondary and unspecified malignant neoplasm of axilla and upper  limb lymph nodes ([place]) 04/14/2017 Left axillary lymph node biopsy: Metastatic carcinoma positive for CK 7, ER positive,GATA-3 equivocal, negative for CK 20,GCDFP, CDX-2, TTF-1; HER-2 positive ratio 2.08 mammogram and ultrasound revealed 3 discrete abnormal left axillary lymph nodes largest measures 1.8 cm Clinical stage: TX N1MX  Treatment plan: 1. Neoadjuvant TCH Perjeta 6 cycles completed 08/31/2017 followed by Herceptin, Perjeta maintenance for 1 year 2. followed by axillary lymph node dissection. Breast surgery will not be performed because there is no primary tumor identifiable on breast MRI: 10/14/17: 0/13 LN Negative 3. Followed by radiation 12/08/2017-01/12/2018 4. Followed by antiestrogen therapy started 01/30/2018 5.Herceptin Perjeta maintenance completed 05/10/2018 6.Neratinib started 05/13/2018 discontinued 04/03/2019  ------------------------------------------------------------------------------------------------------------------ Current treatment: Anastrozole  Skin rashes: In spite of stopping neratinib, the rash is still persistent.   She stopped anastrozole as well and we are waiting to see if the rash improves. Patient's primary care physician referred her to dermatology.  She may require skin biopsy for further evaluation. I discussed with her that there are potentially other reasons for the rash including conditions like lupus.  Or this could be simply a fixed drug eruption.  We will await dermatology recommendations.  She will not restart her antiestrogen therapy until her symptoms resolve.  When they do resolve we may consider treating her with tamoxifen instead.  It could be 2 or 3 months before resuming antiestrogen therapy.  No orders of the defined types were placed in this encounter.  The patient has a good understanding of the overall plan. she agrees with it. she will call with any problems that may develop before the next visit here.  Nicholas Lose, MD 04/21/2019  Julious Oka Dorshimer am acting as scribe for Dr. Nicholas Lose.  I have reviewed the above documentation for accuracy and completeness, and I agree with the above.

## 2019-04-21 ENCOUNTER — Encounter: Payer: Self-pay | Admitting: *Deleted

## 2019-04-21 ENCOUNTER — Inpatient Hospital Stay: Payer: 59 | Attending: Hematology and Oncology | Admitting: Hematology and Oncology

## 2019-04-21 ENCOUNTER — Other Ambulatory Visit: Payer: Self-pay

## 2019-04-21 DIAGNOSIS — R21 Rash and other nonspecific skin eruption: Secondary | ICD-10-CM | POA: Insufficient documentation

## 2019-04-21 DIAGNOSIS — Z79811 Long term (current) use of aromatase inhibitors: Secondary | ICD-10-CM | POA: Diagnosis not present

## 2019-04-21 DIAGNOSIS — C50912 Malignant neoplasm of unspecified site of left female breast: Secondary | ICD-10-CM | POA: Insufficient documentation

## 2019-04-21 DIAGNOSIS — Z170 Estrogen receptor positive status [ER+]: Secondary | ICD-10-CM | POA: Diagnosis not present

## 2019-04-21 DIAGNOSIS — C773 Secondary and unspecified malignant neoplasm of axilla and upper limb lymph nodes: Secondary | ICD-10-CM | POA: Insufficient documentation

## 2019-04-21 DIAGNOSIS — Z79899 Other long term (current) drug therapy: Secondary | ICD-10-CM | POA: Insufficient documentation

## 2019-04-21 NOTE — Assessment & Plan Note (Signed)
04/14/2017 Left axillary lymph node biopsy: Metastatic carcinoma positive for CK 7, ER positive,GATA-3 equivocal, negative for CK 20,GCDFP, CDX-2, TTF-1; HER-2 positive ratio 2.08 mammogram and ultrasound revealed 3 discrete abnormal left axillary lymph nodes largest measures 1.8 cm Clinical stage: TX N1MX  Treatment plan: 1. Neoadjuvant TCH Perjeta 6 cycles completed 08/31/2017 followed by Herceptin, Perjeta maintenance for 1 year 2. followed by axillary lymph node dissection. Breast surgery will not be performed because there is no primary tumor identifiable on breast MRI: 10/14/17: 0/13 LN Negative 3. Followed by radiation 12/08/2017-01/12/2018 4. Followed by antiestrogen therapy started 01/30/2018 5.Herceptin Perjeta maintenance completed 05/10/2018 6.Neratinib started 05/13/2018 discontinued 04/03/2019 ------------------------------------------------------------------------------------------------------------------ Current treatment: Anastrozole  Skin rashes: In spite of stopping neratinib, the rash is still persistent.  Because of this I would like to refer her to dermatology.  She may require skin biopsy for further evaluation.

## 2019-04-27 ENCOUNTER — Ambulatory Visit
Admission: RE | Admit: 2019-04-27 | Discharge: 2019-04-27 | Disposition: A | Discharge status: Home / Self Care | Payer: 59 | Source: Ambulatory Visit | Attending: Hematology and Oncology | Admitting: Hematology and Oncology

## 2019-04-27 ENCOUNTER — Other Ambulatory Visit: Payer: Self-pay

## 2019-04-27 DIAGNOSIS — Z853 Personal history of malignant neoplasm of breast: Secondary | ICD-10-CM | POA: Diagnosis not present

## 2019-04-27 DIAGNOSIS — L309 Dermatitis, unspecified: Secondary | ICD-10-CM | POA: Diagnosis not present

## 2019-04-27 DIAGNOSIS — L011 Impetiginization of other dermatoses: Secondary | ICD-10-CM | POA: Diagnosis not present

## 2019-06-05 ENCOUNTER — Other Ambulatory Visit: Payer: Self-pay | Admitting: *Deleted

## 2019-06-05 MED ORDER — ZOLPIDEM TARTRATE 5 MG PO TABS: 5.0000 mg | ORAL_TABLET | Freq: Every evening | ORAL | 1 refills | Status: DC | PRN

## 2019-06-15 ENCOUNTER — Telehealth: Payer: Self-pay | Admitting: *Deleted

## 2019-06-15 NOTE — Telephone Encounter (Signed)
Spoke with patient to follow up on her rash.  She stated she saw the dermatologist and found it was a staph infection and was given antibiotics.  She states it is much better now.  She has started back on the anastrozole and I made her an appointment for October 21 at 11am to see Dr. Lindi Adie for follow up.

## 2019-09-20 ENCOUNTER — Other Ambulatory Visit: Payer: Self-pay | Admitting: *Deleted

## 2019-09-20 ENCOUNTER — Telehealth: Payer: Self-pay | Admitting: *Deleted

## 2019-09-20 ENCOUNTER — Other Ambulatory Visit: Payer: Self-pay

## 2019-09-20 ENCOUNTER — Encounter: Payer: Self-pay | Admitting: Rehabilitation

## 2019-09-20 ENCOUNTER — Ambulatory Visit: Payer: 59 | Admitting: Rehabilitation

## 2019-09-20 DIAGNOSIS — Z9221 Personal history of antineoplastic chemotherapy: Secondary | ICD-10-CM | POA: Diagnosis not present

## 2019-09-20 DIAGNOSIS — C50919 Malignant neoplasm of unspecified site of unspecified female breast: Secondary | ICD-10-CM | POA: Diagnosis not present

## 2019-09-20 DIAGNOSIS — I890 Lymphedema, not elsewhere classified: Secondary | ICD-10-CM

## 2019-09-20 DIAGNOSIS — Z923 Personal history of irradiation: Secondary | ICD-10-CM | POA: Diagnosis not present

## 2019-09-20 DIAGNOSIS — Z79811 Long term (current) use of aromatase inhibitors: Secondary | ICD-10-CM | POA: Diagnosis not present

## 2019-09-20 DIAGNOSIS — M79602 Pain in left arm: Secondary | ICD-10-CM | POA: Insufficient documentation

## 2019-09-20 DIAGNOSIS — C773 Secondary and unspecified malignant neoplasm of axilla and upper limb lymph nodes: Secondary | ICD-10-CM | POA: Diagnosis present

## 2019-09-20 DIAGNOSIS — C50612 Malignant neoplasm of axillary tail of left female breast: Secondary | ICD-10-CM

## 2019-09-20 NOTE — Progress Notes (Signed)
aa

## 2019-09-20 NOTE — Telephone Encounter (Signed)
"  Melissa [name] [phone]).    May need a new referral to lymphedema clinic.  Finished therapy last spring.  Experiencing major left arm swelling to the entire arm.  Started three days ago.  Started wearing sleeve today, called lymphedema clinic realizing symptoms and had thirteen nodes removed on left side must be lymphedema.  Currently have not received return call.    Lots of lifting over last ten days of a 39 old here with me.      Tylenol started last night for discomfort."

## 2019-09-20 NOTE — Telephone Encounter (Signed)
RN returned call.  Unable to get through.  Orders placed for PT by nurse navigator.

## 2019-09-20 NOTE — Patient Instructions (Signed)
1.)Deep Effective Breath   Standing, sitting, or laying down, place both hands on the belly. Take a deep breath IN, expanding the belly; then breath OUT, contracting the belly. Repeat __5__ times.    Axilla to Axilla - Sweep   2.) On Right and left side make 5 circles in the armpit,  3.) then pump _5__ times from involved armpit across chest to uninvolved armpit, making a pathway.   Axilla to Inguinal Nodes - Sweep   4.) On involved side, make 5 circles at groin at panty line, then pump _5__ times from left armpit along side of trunk to outer hip, making your other pathway.  Arm Posterior: Elbow to Shoulder - Sweep   5.) Pump _5__ times from back of elbow to top of shoulder. 6.) Then inner to outer upper arm _5_ times, then outer arm again _5_ times. Then back to the pathways _2-3_ times. Do _1__ time per day.  Copyright  VHI. All rights reserved.  ARM: [place] Wrist to Elbow - Sweep   7.) Pump or stationary circles _5__ times from wrist to elbow making sure to do both sides of the forearm. Then retrace your steps to the outer arm, and the pathways _2-3_ times each. Do _1__ time per day.  Copyright  VHI. All rights reserved.  ARM: Dorsum of Hand to Shoulder - Sweep   8.) Pump or stationary circles _5__ times on back of hand including knuckle spaces and individual fingers if needed working up towards the wrist  9.), then retrace all your steps working back up the forearm, doing both sides; upper outer arm and back to your pathways _2-3_ times each. Then do 5 circles again at uninvolved armpit and involved groin where you started! Good job!! Do __1_ time per day.  Copyright  VHI. All rights reserved.         How to access self MLD videos:  Web designer Videos:  Option 1:  [email or web address]/self-care-[email or web address]  -Scroll down until you see "Self-MLD Upper Extremity"  -Watch the intro video until you feel comfortable with the material and then  continue on to either the Right or the Left extremity   Option 2: [email or web address]/[email or web address]-self-manual-lymph-drainage-dvd  (The videos are in the Details section of the page)  -OR-  1. Go to [email or web address]  2.  Scroll down until to you see:  [employer] Exclusive Web designer Lymphedema Management Videos  Watch Free   3. Click on "Watch Free"  4. Scroll down until you see the instructional videos  5. Start with the "Self MLD Intro" video  6.  To watch the next appropriate video you will have to make an account on the website. There is a video for Right Arm, Left Arm, Right Leg, and Left Leg

## 2019-09-20 NOTE — Therapy (Signed)
[place], [place], [address] Phone: [phone]   Fax:  [phone]  Physical Therapy Evaluation  Patient Details  Name: Melissa [name] MRN: [id number] Date of Birth: Jan 13, 1961 Referring Provider (PT): Dr. Lindi Adie   Encounter Date: 09/20/2019  PT End of Session - 09/20/19 1355    Visit Number  1    Number of Visits  4    Date for PT Re-Evaluation  10/18/19    PT Start Time  1300    PT Stop Time  1350    PT Time Calculation (min)  50 min    Activity Tolerance  Patient tolerated treatment well    Behavior During Therapy  [facility] for tasks assessed/performed       Past Medical History:  Diagnosis Date  . Breast cancer ([place]) 04/2017   left breast  . Cervical cancer ([place])    1990, treated with hysterectomy only  . Family history of breast cancer   . Family history of prostate cancer   . GERD (gastroesophageal reflux disease)   . History of radiation therapy 12/09/17- 01/12/18   Left Breast treated to 50 Gy with 25 fx of 2 Gy  . Hypercholesteremia   . Hypertension   . Personal history of chemotherapy   . Personal history of radiation therapy     Past Surgical History:  Procedure Laterality Date  . ABDOMINAL HYSTERECTOMY  1990  . AXILLARY LYMPH NODE DISSECTION Left 10/14/2017   Procedure: LEFT AXILLARY LYMPH NODE DISSECTION;  Surgeon: Rolm Bookbinder, MD;  Location: [facility];  Service: General;  Laterality: Left;  . BREAST LUMPECTOMY Left    04/2017  . EYE SURGERY     eye lift  . PORTACATH PLACEMENT Right 05/12/2017   Procedure: INSERTION PORT-A-CATH WITH ULTRASOUND;  Surgeon: Rolm Bookbinder, MD;  Location: [facility];  Service: General;  Laterality: Right;    There were no vitals filed for this visit.   Subjective Assessment - 09/20/19 1303    Subjective  I know what I am doing.  I have been lifting my grandson as they are living me in the past few months.   I already have a sleeve and gauntlet and I just put it on today. Denies quick onset and other signs of DVT.  Some finger numbness    Pertinent History  Pt completed neoadjuvant chemotherapy 05/12/17-08/31/17 followed by left ALND 0/13 nodes negative.  No breast surgery performed due to no breast primary every indentified.  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 Radiation also completed.  Currently on anti-estrogens.    Limitations  Lifting    Patient Stated Goals  what to do for lymphedema    Currently in Pain?  Yes    Pain Score  1     Pain Location  Axilla    Pain Orientation  Left    Pain Descriptors / Indicators  Aching    Pain Type  Acute pain    Pain Onset  In the past 7 days    Pain Frequency  Constant    Aggravating Factors   the swelling    Pain Relieving Factors  tylenol         [facility] PT Assessment - 09/20/19 0001      Assessment   Medical Diagnosis  left breast cancer    Referring Provider (PT)  Dr. Lindi Adie    Onset Date/Surgical Date  10/14/17  Hand Dominance  Right    Prior Therapy  yes      Precautions   Precaution Comments  lymphedema left      Restrictions   Weight Bearing Restrictions  No      Balance Screen   Has the patient fallen in the past 6 months  No    Has the patient had a decrease in activity level because of a fear of falling?   No    Is the patient reluctant to leave their home because of a fear of falling?   No      Home Environment   Living Environment  Private residence    Living Arrangements  Spouse/significant other    Available Help at [facility]  Two level      Prior Function   Level of Independence  Independent      Cognition   Overall Cognitive Status  Within Functional Limits for tasks assessed      Observation/Other Assessments   Observations  arrives wearing sleeve and gauntlet; has juzo soft sleeve and gauntlet wearing well. Mild fullness medial upper arm no  pitting, increased fullness back of hand    Skin Integrity  well healed;       Sensation   Additional Comments  I'm still numb in the armpit       Coordination   Gross Motor Movements are Fluid and Coordinated  Yes      Posture/Postural Control   Posture/Postural Control  Postural limitations    Postural Limitations  Rounded Shoulders;Forward head      ROM / Strength   AROM / PROM / Strength  --   pt reports no ROM concerns at this time        LYMPHEDEMA/ONCOLOGY QUESTIONNAIRE - 09/20/19 1317      Type   Cancer Type  Left breast      Surgeries   Axillary Lymph Node Dissection Date  --   2018   Number Lymph Nodes Removed  --   0/13     Treatment   Active Chemotherapy Treatment  No    Past Chemotherapy Treatment  Yes    Active Radiation Treatment  No    Past Radiation Treatment  Yes    Current Hormone Treatment  Yes      What other symptoms do you have   Are you Having Heaviness or Tightness  Yes    Are you having Pain  Yes    Are you having pitting edema  No    Is it Hard or Difficult finding clothes that fit  No      Lymphedema Assessments   Lymphedema Assessments  Upper extremities      Right Upper Extremity Lymphedema   15 cm Proximal to Olecranon Process  29.3 cm    10 cm Proximal to Olecranon Process  28.3 cm    Olecranon Process  25.5 cm    15 cm Proximal to Ulnar Styloid Process  23.5 cm    10 cm Proximal to Ulnar Styloid Process  19.7 cm    Just Proximal to Ulnar Styloid Process  15.2 cm    Across Hand at [employer]  18.5 cm    At [place] of 2nd Digit  6.3 cm      Left Upper Extremity Lymphedema   15 cm Proximal to Olecranon Process  30.4 cm    10 cm Proximal to Olecranon Process  29.3 cm  Olecranon Process  25.1 cm    15 cm Proximal to Ulnar Styloid Process  23.5 cm    10 cm Proximal to Ulnar Styloid Process  19.3 cm    Just Proximal to Ulnar Styloid Process  15.3 cm    Across Hand at [employer]  18.1 cm    At [place] of 2nd Digit  6.3 cm     Other  7.6             Objective measurements completed on examination: See above findings.      [facility] Adult PT Treatment/Exercise - 09/20/19 0001      Manual Therapy   Manual Therapy  Manual Lymphatic Drainage (MLD)    Manual therapy comments  assessed sleeve with good fit.  Pt has a few already.  Discussed compression bras or camis and that she will get a script from Dr. Lindi Adie next week if she wants to get one    Manual Lymphatic Drainage (MLD)  Briefly in seated: education on self MLD technique, MLD anatomy and physiology and correct performance of all steps per instruction handout.  Pt also given link for [employer].               PT Education - 09/20/19 1354    Education Details  lymphedema, use of compression, MLD use and technique    Person(s) Educated  Patient    Methods  Explanation;Demonstration;Tactile cues;Verbal cues;Handout    Comprehension  Verbalized understanding;Returned demonstration;Verbal cues required;Tactile cues required;Need further instruction          PT Long Term Goals - 09/20/19 1413      PT LONG TERM GOAL #1   Title  Pt will be independent with lymphedema self care including use of compression, self MLD, and risk reduction    Time  4    Period  Weeks    Status  New      PT LONG TERM GOAL #2   Title  Pt will be independent with strength ABC for home use to increase strength    Time  4    Period  Weeks    Status  New      PT LONG TERM GOAL #3   Title  Pt will return to baseline of no pain in the arm at night with use of compression and self MLD    Time  4    Period  Weeks    Status  New             Plan - 09/20/19 1356    Clinical Impression Statement  Pt was seen here previously after ALND in 2018 but returns today with 3 day new onset of swelling in the left UE. No signs of DVT evident.  Pt reports her grandson has been living with her the past few weeks and she is lifting him throughout the day and she  noticed her arm starting to swell.  Some increased puffiness the back of the hand and medial elbow/upper arm.  No significant measurement differences between sides except for the most proximal two.  Pt already had compression garments which fit nicely.  Educated pt on compressoin use QD for at least 4-6 weeks with self MLD.  Education briefly on self MLD with paper handout and video links for pt to start at home.  Pt will return in a few days to check technique and make sure the measurements are staying consistent with current care.  Pt will  see if she swells overnight for possible night garment, but most likely not needed.    Personal Factors and Comorbidities  Comorbidity 2    Comorbidities  radiation history and ALND    Stability/Clinical Decision Making  Evolving/Moderate complexity    Clinical Decision Making  Low    Rehab Potential  Excellent    PT Frequency  1x / week    PT Duration  4 weeks    PT Treatment/Interventions  ADLs/Self Care Home Management;Compression bandaging;Manual lymph drainage;Manual techniques;Therapeutic exercise;Patient/family education    PT Next Visit Plan  review self MLD, check measurements, any more recommendations? remind her she can get compression tank or bra and night time if wanted.  See if pt needs more visits for PT MLD/mld instruction or for strength and ROM    PT Home Exercise Plan  self MLD    Consulted and Agree with Plan of Care  Patient       Patient will benefit from skilled therapeutic intervention in order to improve the following deficits and impairments:  Pain, Postural dysfunction, Decreased activity tolerance, Decreased strength, Impaired UE functional use, Increased edema  Visit Diagnosis: Lymphedema  Pain in left arm     Problem List Patient Active Problem List   Diagnosis Date Noted  . Genetic testing 09/16/2018  . Family history of breast cancer   . Family history of prostate cancer   . 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 Breast cancer, left ([place]) 10/14/2017  . Port catheter in place 07/20/2017  . 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 Malignant (primary) neoplasm, unspecified ([place]) 04/26/2017  . 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[place]) 04/21/2017    Stark Bray 09/20/2019, 2:44 PM  [place], [place], [address] Phone: [phone]   Fax:  [phone]  Name: ALIZ MUSGRAVE MRN: [id number] Date of Birth: Jan 24, 1961

## 2019-09-26 ENCOUNTER — Ambulatory Visit: Payer: 59 | Admitting: Hematology and Oncology

## 2019-09-26 ENCOUNTER — Other Ambulatory Visit: Payer: Self-pay

## 2019-09-26 ENCOUNTER — Ambulatory Visit: Payer: 59

## 2019-09-26 DIAGNOSIS — M79602 Pain in left arm: Secondary | ICD-10-CM

## 2019-09-26 DIAGNOSIS — I890 Lymphedema, not elsewhere classified: Secondary | ICD-10-CM | POA: Diagnosis not present

## 2019-09-26 NOTE — Patient Instructions (Signed)
Deep Effective Breath ° ° °Standing, sitting, or laying down, place both hands on the belly. Take a deep breath IN, expanding the belly; then breath OUT, contracting the belly. °Repeat __5__ times. Do __2-3__ sessions per day and before your self massage. ° °http://gt2.exer.us/866  ° °Copyright © VHI. All rights reserved.  °Axilla to Axilla - Sweep ° ° °On uninvolved side make 5 circles in the armpit, then pump _5__ times from involved armpit across chest to uninvolved armpit, making a pathway. °Do _1__ time per day. ° °Copyright © VHI. All rights reserved.  °Axilla to Inguinal Nodes - Sweep ° ° °On involved side, make 5 circles at groin at panty line, then pump _5__ times from armpit along side of trunk to outer hip, making your other pathway. °Do __1_ time per day. ° °Copyright © VHI. All rights reserved.  °Arm Posterior: Elbow to Shoulder - Sweep ° ° °Pump _5__ times from back of elbow to top of shoulder. Then inner to outer upper arm _5_ times, then outer arm again _5_ times. Then back to the pathways _2-3_ times. °Do _1__ time per day. ° °Copyright © VHI. All rights reserved.  °ARM: [place] Wrist to Elbow - Sweep ° ° °Pump or stationary circles _5__ times from wrist to elbow making sure to do both sides of the forearm. Then retrace your steps to the outer arm, and the pathways _2-3_ times each. °Do _1__ time per day. ° °Copyright © VHI. All rights reserved.  °ARM: Dorsum of Hand to Shoulder - Sweep ° ° °Pump or stationary circles _5__ times on back of hand including knuckle spaces and individual fingers if needed working up towards the wrist, then retrace all your steps working back up the forearm, doing both sides; upper outer arm and back to your pathways _2-3_ times each. Then do 5 circles again at uninvolved armpit and involved groin where you started! Good job!! °Do __1_ time per day. °

## 2019-09-26 NOTE — Therapy (Signed)
[place], [place], [address] Phone: [phone]   Fax:  [phone]  Physical Therapy Treatment  Patient Details  Name: Melissa [name] MRN: [id number] Date of Birth: [birth year] Referring Provider (PT): Dr. Lindi Adie   Encounter Date: 09/26/2019  PT End of Session - 09/26/19 1106    Visit Number  2    Number of Visits  4    Date for PT Re-Evaluation  10/18/19    PT Start Time  1007    PT Stop Time  1105    PT Time Calculation (min)  58 min    Activity Tolerance  Patient tolerated treatment well    Behavior During Therapy  [facility] for tasks assessed/performed       Past Medical History:  Diagnosis Date  . Breast cancer ([place]) 04/2017   left breast  . Cervical cancer ([place])    1990, treated with hysterectomy only  . Family history of breast cancer   . Family history of prostate cancer   . GERD (gastroesophageal reflux disease)   . History of radiation therapy 12/09/17- 01/12/18   Left Breast treated to 50 Gy with 25 fx of 2 Gy  . Hypercholesteremia   . Hypertension   . Personal history of chemotherapy   . Personal history of radiation therapy     Past Surgical History:  Procedure Laterality Date  . ABDOMINAL HYSTERECTOMY  1990  . AXILLARY LYMPH NODE DISSECTION Left 10/14/2017   Procedure: LEFT AXILLARY LYMPH NODE DISSECTION;  Surgeon: Rolm Bookbinder, MD;  Location: [facility];  Service: General;  Laterality: Left;  . BREAST LUMPECTOMY Left    04/2017  . EYE SURGERY     eye lift  . PORTACATH PLACEMENT Right 05/12/2017   Procedure: INSERTION PORT-A-CATH WITH ULTRASOUND;  Surgeon: Rolm Bookbinder, MD;  Location: [facility];  Service: General;  Laterality: Right;    There were no vitals filed for this visit.  Subjective Assessment - 09/26/19 1018    Subjective  I am so mad at myself about allowing this and not realizing. But I have been wearing my compression sleeve  and gauntlet daily since I noticed the swelling and I can tell a difference.    Pertinent History  Pt completed neoadjuvant chemotherapy 05/12/17-08/31/17 followed by left ALND 0/13 nodes negative.  No breast surgery performed due to no breast primary every indentified.  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 Radiation also completed.  Currently on anti-estrogens.    Patient Stated Goals  what to do for lymphedema    Currently in Pain?  No/denies            LYMPHEDEMA/ONCOLOGY QUESTIONNAIRE - 09/26/19 1021      Left Upper Extremity Lymphedema   15 cm Proximal to Olecranon Process  29 cm    10 cm Proximal to Olecranon Process  27.6 cm    Olecranon Process  23.5 cm    15 cm Proximal to Ulnar Styloid Process  22.7 cm    10 cm Proximal to Ulnar Styloid Process  19.7 cm    Just Proximal to Ulnar Styloid Process  14.45 cm    Across Hand at [employer]  16.7 cm    At [place] of 2nd Digit  5.6 cm                [facility] Adult PT Treatment/Exercise - 09/26/19 0001      Manual  Therapy   Manual Therapy  Manual Lymphatic Drainage (MLD);Myofascial release;Passive ROM    Manual therapy comments  circumference measurements taken    Myofascial Release  To Lt axilla at end of P/ROM    Manual Lymphatic Drainage (MLD)  In Supine: Short neck, superficial and deep abdominals, Lt inguinal and Rt axillary nodes, Lt axillo-inguinal and anterior inter-axillary anastomosis, then Lt UE working from lateral upper arm to dorsal hand working from proximal to distal then retracing all steps reviewing correct pressure with pt throughout    Passive ROM  In Supine to Lt shoulder into flexion, abduction and er with abduction to pts tolerance, she is limited at all end ranges by tightness and tenderness at Lt axilla/lateral trunk                  PT Long Term Goals - 09/20/19 1413      PT LONG TERM GOAL #1   Title  Pt will be independent with lymphedema self  care including use of compression, self MLD, and risk reduction    Time  4    Period  Weeks    Status  New      PT LONG TERM GOAL #2   Title  Pt will be independent with strength ABC for home use to increase strength    Time  4    Period  Weeks    Status  New      PT LONG TERM GOAL #3   Title  Pt will return to baseline of no pain in the arm at night with use of compression and self MLD    Time  4    Period  Weeks    Status  New            Plan - 09/26/19 1106    Clinical Impression Statement  Continued manual lymph drainage instructing pt in corect light pressure during and included P/ROM and myofascial release to Lt shoulder/axilla. Pt very tight and tender at end motions with all stretches but reports arm feeling better at end of session. Spent time during session encouraging pt that her lymphedema, at this early stage, is likely reversible as she caught it early and began wearing compression immediately. Reviewed importance of research showing most benefit of daily wear for at least next 4 weeks and pt verbalized understanding this. Her circumference measurements were well reduced today except for slight increase at 10 cm from ulnar styloid, but not out of normal range. Overall pt reports feeling much better and more in control of new onset of symptoms by end of session.    Personal Factors and Comorbidities  Comorbidity 2    Comorbidities  radiation history and ALND    Stability/Clinical Decision Making  Evolving/Moderate complexity    Rehab Potential  Excellent    PT Frequency  1x / week    PT Duration  4 weeks    PT Treatment/Interventions  ADLs/Self Care Home Management;Compression bandaging;Manual lymph drainage;Manual techniques;Therapeutic exercise;Patient/family education    PT Next Visit Plan  review self MLD, check measurements, remind her she can get compression tank or bra and night time if wanted.  See if pt needs more visits for PT MLD/mld instruction or for  strength and ROM    PT Home Exercise Plan  self MLD, wear garments daily, and incorporate end ROM Lt shoulder stretching into day    Consulted and Agree with Plan of Care  Patient       Patient  will benefit from skilled therapeutic intervention in order to improve the following deficits and impairments:  Pain, Postural dysfunction, Decreased activity tolerance, Decreased strength, Impaired UE functional use, Increased edema  Visit Diagnosis: Lymphedema  Pain in left arm     Problem List Patient Active Problem List   Diagnosis Date Noted  . Genetic testing 09/16/2018  . Family history of breast cancer   . Family history of prostate cancer   . Secondary malignant neoplasm of axillary lymph nodes ([place]) 11/17/2017  . Breast cancer, left ([facility]) 10/14/2017  . Port catheter in place 07/20/2017  . 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 Malignant (primary) neoplasm, unspecified ([facility]) 04/26/2017  . 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[place]) 04/21/2017    [name], PTA 09/26/2019, 12:32 PM  [place], [place], [address] Phone: [phone]   Fax:  [phone]  Name: Melissa [name] MRN: [id number] Date of Birth: [birth year]

## 2019-09-27 ENCOUNTER — Ambulatory Visit: Payer: 59 | Admitting: Hematology and Oncology

## 2019-10-02 NOTE — Progress Notes (Signed)
Patient Care Team: Deland Pretty, MD as PCP - General (Internal Medicine)  DIAGNOSIS:    ICD-10-CM   1. 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.3     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[place])  04/14/2017 Initial Diagnosis   Left axillary lymph node biopsy: Metastatic carcinoma positive for CK 7, ER positive,GATA-3 equivocal, negative for CK 20,GCDFP, CDX-2, TTF-1; HER-2 positive ratio 2.08; mammogram and ultrasound revealed 3 discrete abnormal left axillary lymph nodes largest measures 1.8 cm   04/23/2017 Breast MRI   Left axillary lymphadenopathy numerous enlarged level I axillary lymph nodes largest 2.1 cm, no other abdominal masses in the left breast itself, no MRI evidence of malignancy in the right breast   04/26/2017 Imaging   CT CAP: Prominent left axillary lymph nodes no distant metastases, sclerotic focus left pedicle of T9 vertebra favored benign, T5 vertebral hemangioma   05/12/2017 - 08/31/2017 Neo-Adjuvant Chemotherapy   TCH Perjeta x6 followed by Herceptin Perjeta maintenance for 1 year   10/14/2017 Surgery   Left axillary lymph node dissection: 0/13 lymph nodes complete pathologic response.  Breast was not operated on because no breast primary was ever identified   12/09/2017 - 01/11/2018 Radiation Therapy   Adjuvant radiation therapy   01/30/2018 -  Anti-estrogen oral therapy   Anastrozole 1 mg daily, Neratinib started 05/13/2018   09/09/2018 Genetic Testing   The MyRisk gene panel offered by [employer] includes sequencing and deletion/duplication testing of the following 35 genes: APC, ATM, BARD1, BMPR1A, BRCA1, BRCA2, BRIP1, CHD1, CDK4, CDKN2A, CHEK2, EPCAM (large rearrangement only), GREM1, HOXB13, AXIN2, MSH3, NTHL1, RNF43, GALNT12, RSP20, MLH1, MSH2, MSH6, MUTYH, NBN, PALB2, PMS2, PTEN, RAD51C, RAD51D, SMAD4,  STK11, and TP53. Sequencing was performed for select regions of POLE and POLD1, and large rearrangement analysis was performed for select regions of GREM1.  Results: Negative, no pathogenic variants identified.  The date of this test report is 09/09/2018.     CHIEF COMPLIANT: Follow-up of breast cancer and profound rash on anastrozole   INTERVAL HISTORY: Melissa [name] is a 58 y.o. with above-mentioned history of breast cancer who was on adjuvant therapy with [name] and anastrozole, which were discontinued due to profound rashes on her arms, ankles, and axilla. She was seen in Dermatology and diagnosed with a staph infection. The rash resolved with antibiotics and anastrozole was re-initiated in 06/2019. She presents to the clinic today for follow-up.   REVIEW OF SYSTEMS:   Constitutional: Denies fevers, chills or abnormal weight loss Eyes: Denies blurriness of vision Ears, nose, mouth, throat, and face: Denies mucositis or sore throat Respiratory: Denies cough, dyspnea or wheezes Cardiovascular: Denies palpitation, chest discomfort Gastrointestinal: Denies nausea, heartburn or change in bowel habits Skin: Denies abnormal skin rashes Lymphatics: Denies new lymphadenopathy or easy bruising Neurological: Denies numbness, tingling or new weaknesses Behavioral/Psych: Mood is stable, no new changes  Extremities: No lower extremity edema Breast: denies any pain or lumps or nodules in either breasts All other systems were reviewed with the patient and are negative.  I have reviewed the past medical history, past surgical history, social history and family history with the patient and they are unchanged from previous note.  ALLERGIES:  has No Known Allergies.  MEDICATIONS:  Current Outpatient Medications  Medication Sig Dispense Refill  . anastrozole (ARIMIDEX) 1 MG tablet Take 1 tablet (1 mg total) by mouth  daily. 90 tablet 3  . calcium carbonate (OS-CAL) 1250 (500 Ca) MG chewable tablet  Chew 1 tablet by mouth daily. Calcium 600 mg    . cholecalciferol (VITAMIN D) 1000 units tablet Take 1,000 Units by mouth daily. 2000 iu    . diphenoxylate-atropine (LOMOTIL) 2.5-0.025 MG tablet Take 2 tablets by mouth 4 (four) times daily as needed for diarrhea or loose stools. 120 tablet 6  . levocetirizine (XYZAL) 5 MG tablet Take 5 mg by mouth every evening.    . loperamide (IMODIUM) 2 MG capsule Take 2 capsules (4 mg total) by mouth 3 (three) times daily as needed for diarrhea or loose stools. 120 capsule 6  . methylPREDNISolone (MEDROL DOSEPAK) 4 MG TBPK tablet Use as directed 21 tablet 0  . rosuvastatin (CRESTOR) 10 MG tablet Take 10 mg by mouth daily.   0  . triamterene-hydrochlorothiazide (MAXZIDE-25) 37.5-25 MG tablet Take 1 tablet by mouth daily.    Marland Kitchen zolpidem (AMBIEN) 5 MG tablet Take 1 tablet (5 mg total) by mouth at bedtime as needed. for sleep 90 tablet 1   No current facility-administered medications for this visit.     PHYSICAL EXAMINATION: ECOG PERFORMANCE STATUS: 1 - Symptomatic but completely ambulatory  There were no vitals filed for this visit. There were no vitals filed for this visit.  GENERAL: alert, no distress and comfortable SKIN: skin color, texture, turgor are normal, no rashes or significant lesions EYES: normal, Conjunctiva are pink and non-injected, sclera clear OROPHARYNX: no exudate, no erythema and lips, buccal mucosa, and tongue normal  NECK: supple, thyroid normal size, non-tender, without nodularity LYMPH: no palpable lymphadenopathy in the cervical, axillary or inguinal LUNGS: clear to auscultation and percussion with normal breathing effort HEART: regular rate & rhythm and no murmurs and no lower extremity edema ABDOMEN: abdomen soft, non-tender and normal bowel sounds MUSCULOSKELETAL: no cyanosis of digits and no clubbing  NEURO: alert & oriented x 3 with fluent speech, no focal motor/sensory deficits EXTREMITIES: No lower extremity edema   LABORATORY DATA:  I have reviewed the data as listed CMP Latest Ref Rng & Units 03/01/2019 01/30/2019 12/01/2018  Glucose 70 - 99 mg/dL 87 96 93  BUN 6 - 20 mg/dL 22(H) 20 17  Creatinine 0.44 - 1.00 mg/dL 0.85 1.04(H) 1.00  Sodium 135 - 145 mmol/L 139 140 141  Potassium 3.5 - 5.1 mmol/L 3.5 4.0 4.1  Chloride 98 - 111 mmol/L 98 101 105  CO2 22 - 32 mmol/L _0 Calcium 8.9 - 10.3 mg/dL 9.6 9.9 9.8  Total Protein 6.5 - 8.1 g/dL 7.6 7.7 6.9  Total Bilirubin 0.3 - 1.2 mg/dL 0.4 0.5 0.3  Alkaline Phos 38 - 126 U/L 89 93 97  AST 15 - 41 U/L _1 ALT 0 - 44 U/L _2 Lab Results  Component Value Date   WBC 6.1 03/01/2019   HGB 14.6 03/01/2019   HCT 43.7 03/01/2019   MCV 90.7 03/01/2019   PLT 352 03/01/2019   NEUTROABS 2.9 03/01/2019    ASSESSMENT & PLAN:  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 Neoadjuvant TCH Perjeta 6 cycles completed 08/31/2017 followed  by Herceptin, Perjeta maintenance for 1 year 2. followed by axillary lymph node dissection. Breast surgery will not be performed because there is no primary tumor identifiable on breast MRI: 10/14/17: 0/13 LN Negative 3. Followed by radiation 12/08/2017-01/12/2018 4. Followed by antiestrogen therapy started 01/30/2018 5.Herceptin Perjeta maintenance completed 05/10/2018 6.Neratinib started 05/13/2018 discontinued 04/03/2019 ------------------------------------------------------------------------------------------------------------------ Current treatment: Anastrozole  Severe rash on the leg was felt to be related to staph infection and was treated with  antibiotics.  She also had a skin biopsy which confirmed that. She has been able to resume anastrozole and has been tolerating it extremely well.  Surveillance: Mammograms will be done in May 2021 Breast MRIs will be done every other year.  This year in November 2020 we will obtain a breast MRI.  Return to clinic in 1 year for follow-up. No orders of the defined types were placed in this encounter.  The patient has a good understanding of the overall plan. she agrees with it. she will call with any problems that may develop before the next visit here.  Nicholas Lose, MD 10/03/2019  Julious Oka Dorshimer am acting as scribe for Dr. Nicholas Lose.  I have reviewed the above documentation for accuracy and completeness, and I agree with the above.

## 2019-10-03 ENCOUNTER — Ambulatory Visit: Payer: 59

## 2019-10-03 ENCOUNTER — Other Ambulatory Visit: Payer: Self-pay

## 2019-10-03 ENCOUNTER — Inpatient Hospital Stay: Payer: 59 | Attending: Hematology and Oncology | Admitting: Hematology and Oncology

## 2019-10-03 DIAGNOSIS — Z9221 Personal history of antineoplastic chemotherapy: Secondary | ICD-10-CM | POA: Insufficient documentation

## 2019-10-03 DIAGNOSIS — Z923 Personal history of irradiation: Secondary | ICD-10-CM | POA: Insufficient documentation

## 2019-10-03 DIAGNOSIS — Z1231 Encounter for screening mammogram for malignant neoplasm of breast: Secondary | ICD-10-CM | POA: Diagnosis not present

## 2019-10-03 DIAGNOSIS — C773 Secondary and unspecified malignant neoplasm of axilla and upper limb lymph nodes: Secondary | ICD-10-CM | POA: Diagnosis not present

## 2019-10-03 DIAGNOSIS — M79602 Pain in left arm: Secondary | ICD-10-CM | POA: Insufficient documentation

## 2019-10-03 DIAGNOSIS — I890 Lymphedema, not elsewhere classified: Secondary | ICD-10-CM | POA: Insufficient documentation

## 2019-10-03 DIAGNOSIS — C50919 Malignant neoplasm of unspecified site of unspecified female breast: Secondary | ICD-10-CM | POA: Insufficient documentation

## 2019-10-03 DIAGNOSIS — Z79811 Long term (current) use of aromatase inhibitors: Secondary | ICD-10-CM | POA: Insufficient documentation

## 2019-10-03 MED ORDER — ZINC GLUCONATE 50 MG PO TABS: 50.0000 mg | ORAL_TABLET | Freq: Every day | ORAL | Status: DC

## 2019-10-03 NOTE — Therapy (Signed)
[place], [place], [address] Phone: [phone]   Fax:  [phone]  Physical Therapy Treatment  Patient Details  Name: Melissa [name] MRN: [id number] Date of Birth: [birth year] Referring Provider (PT): Dr. Lindi Adie   Encounter Date: 10/03/2019  PT End of Session - 10/03/19 1002    Visit Number  3    Number of Visits  4    Date for PT Re-Evaluation  10/18/19    PT Start Time  0906    PT Stop Time  1001    PT Time Calculation (min)  55 min    Activity Tolerance  Patient tolerated treatment well    Behavior During Therapy  [facility] for tasks assessed/performed       Past Medical History:  Diagnosis Date  . Breast cancer ([place]) 04/2017   left breast  . Cervical cancer ([place])    1990, treated with hysterectomy only  . Family history of breast cancer   . Family history of prostate cancer   . GERD (gastroesophageal reflux disease)   . History of radiation therapy 12/09/17- 01/12/18   Left Breast treated to 50 Gy with 25 fx of 2 Gy  . Hypercholesteremia   . Hypertension   . Personal history of chemotherapy   . Personal history of radiation therapy     Past Surgical History:  Procedure Laterality Date  . ABDOMINAL HYSTERECTOMY  1990  . AXILLARY LYMPH NODE DISSECTION Left 10/14/2017   Procedure: LEFT AXILLARY LYMPH NODE DISSECTION;  Surgeon: Rolm Bookbinder, MD;  Location: [place];  Service: General;  Laterality: Left;  . BREAST LUMPECTOMY Left    04/2017  . EYE SURGERY     eye lift  . PORTACATH PLACEMENT Right 05/12/2017   Procedure: INSERTION PORT-A-CATH WITH ULTRASOUND;  Surgeon: Rolm Bookbinder, MD;  Location: [place];  Service: General;  Laterality: Right;    There were no vitals filed for this visit.  Subjective Assessment - 10/03/19 0909    Subjective  I had some pain after last session and I think it was just because I've allowed myself to stop using my Rt  arm. It was fine by the next day. I know I'm not moving my Rt arm with ADLs like I should and it's completely unintentional.    Pertinent History  Pt completed neoadjuvant chemotherapy 05/12/17-08/31/17 followed by left ALND 0/13 nodes negative.  No breast surgery performed due to no breast primary every indentified.  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 Radiation also completed.  Currently on anti-estrogens.    Patient Stated Goals  what to do for lymphedema    Currently in Pain?  No/denies         [facility] PT Assessment - 10/03/19 0001      ROM / Strength   AROM / PROM / Strength  AROM      AROM   AROM Assessment Site  Shoulder    Right/Left Shoulder  Left;Right    Right Shoulder Flexion  159 Degrees    Right Shoulder ABduction  172 Degrees    Left Shoulder Flexion  141 Degrees    Left Shoulder ABduction  147 Degrees        LYMPHEDEMA/ONCOLOGY QUESTIONNAIRE - 10/03/19 0916      Left Upper Extremity Lymphedema   15 cm Proximal to Olecranon Process  28.8 cm    10 cm Proximal to Olecranon Process  28 cm  Olecranon Process  23.9 cm    15 cm Proximal to Ulnar Styloid Process  22.9 cm    10 cm Proximal to Ulnar Styloid Process  19.7 cm    Just Proximal to Ulnar Styloid Process  14.5 cm    Across Hand at [employer]  16.9 cm    At [place] of 2nd Digit  5.7 cm                [facility] Adult PT Treatment/Exercise - 10/03/19 0001      Manual Therapy   Myofascial Release  To Lt axilla at end of P/ROM    Manual Lymphatic Drainage (MLD)  In Supine: Short neck, superficial and deep abdominals, Lt inguinal and Rt axillary nodes, Lt axillo-inguinal and anterior inter-axillary anastomosis, then Lt UE working from lateral upper arm to dorsal hand working from proximal to distal then retracing all steps reviewing correct pressure with pt throughout    Passive ROM  In Supine to Lt shoulder into flexion, abduction and er with abduction to pts  tolerance, she is limited at all end ranges by tightness and tenderness at Lt axilla/lateral trunk                  PT Long Term Goals - 09/20/19 1413      PT LONG TERM GOAL #1   Title  Pt will be independent with lymphedema self care including use of compression, self MLD, and risk reduction    Time  4    Period  Weeks    Status  New      PT LONG TERM GOAL #2   Title  Pt will be independent with strength ABC for home use to increase strength    Time  4    Period  Weeks    Status  New      PT LONG TERM GOAL #3   Title  Pt will return to baseline of no pain in the arm at night with use of compression and self MLD    Time  4    Period  Weeks    Status  New            Plan - 10/03/19 1213    Clinical Impression Statement  Pt came in reporting some increased pain after last session but this decreased by next day. Continued with manaul therapy for manual lymph drainage and end Lt shoulder ROM stretching. She reports this felt more tolerable today and shoulder felt looser by end of session. Her circumference measurements had increased 0.4 cm in 2 locations, but all others were reduced or maintained. Pt is showing good progress towards independence with Maintenance Phase of lymhpedema treatment, but would benefit from continued therapy for end ROM stretching as this is limited as evidenced by A/ROM measurements taken today.    Personal Factors and Comorbidities  Comorbidity 2    Comorbidities  radiation history and ALND    Stability/Clinical Decision Making  Evolving/Moderate complexity    Rehab Potential  Excellent    PT Frequency  1x / week    PT Duration  4 weeks    PT Treatment/Interventions  ADLs/Self Care Home Management;Compression bandaging;Manual lymph drainage;Manual techniques;Therapeutic exercise;Patient/family education    PT Next Visit Plan  Instruct in Strength ABC Program; review self MLD, check measurements, remind her she can get compression tank or bra  and night time if wanted.  See if pt needs more visits for PT MLD/mld instruction or for  strength and ROM    Consulted and Agree with Plan of Care  Patient       Patient will benefit from skilled therapeutic intervention in order to improve the following deficits and impairments:  Pain, Postural dysfunction, Decreased activity tolerance, Decreased strength, Impaired UE functional use, Increased edema  Visit Diagnosis: Lymphedema  Pain in left arm     Problem List Patient Active Problem List   Diagnosis Date Noted  . Genetic testing 09/16/2018  . Family history of breast cancer   . Family history of prostate cancer   . Secondary malignant neoplasm of axillary lymph nodes ([place]) 11/17/2017  . Breast cancer, left ([place]) 10/14/2017  . Port catheter in place 07/20/2017  . 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 Malignant (primary) neoplasm, unspecified ([place]) 04/26/2017  . 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[place]) 04/21/2017    [name], PTA 10/03/2019, 12:17 PM  [place], [place], [address] Phone: [phone]   Fax:  [phone]  Name: LEIDI ZARRA MRN: [id number] Date of Birth: [birth year]

## 2019-10-03 NOTE — Assessment & Plan Note (Signed)
04/14/2017 Left axillary lymph node biopsy: Metastatic carcinoma positive for CK 7, ER positive,GATA-3 equivocal, negative for CK 20,GCDFP, CDX-2, TTF-1; HER-2 positive ratio 2.08 mammogram and ultrasound revealed 3 discrete abnormal left axillary lymph nodes largest measures 1.8 cm Clinical stage: TX N1MX  Treatment plan: 1. Neoadjuvant TCH Perjeta 6 cycles completed 08/31/2017 followed by Herceptin, Perjeta maintenance for 1 year 2. followed by axillary lymph node dissection. Breast surgery will not be performed because there is no primary tumor identifiable on breast MRI: 10/14/17: 0/13 LN Negative 3. Followed by radiation 12/08/2017-01/12/2018 4. Followed by antiestrogen therapy started 01/30/2018 5.Herceptin Perjeta maintenance completed 05/10/2018 6.Neratinib started 05/13/2018 discontinued 04/03/2019 ------------------------------------------------------------------------------------------------------------------ Current treatment: Anastrozole (on hold because of rash)

## 2019-10-04 ENCOUNTER — Telehealth: Payer: Self-pay | Admitting: Hematology and Oncology

## 2019-10-04 NOTE — Telephone Encounter (Signed)
I talk with patient regarding schedule  

## 2019-10-10 ENCOUNTER — Other Ambulatory Visit: Payer: Self-pay

## 2019-10-10 ENCOUNTER — Ambulatory Visit: Payer: 59 | Attending: Hematology and Oncology

## 2019-10-10 DIAGNOSIS — M25612 Stiffness of left shoulder, not elsewhere classified: Secondary | ICD-10-CM | POA: Diagnosis present

## 2019-10-10 DIAGNOSIS — M79602 Pain in left arm: Secondary | ICD-10-CM | POA: Diagnosis present

## 2019-10-10 DIAGNOSIS — I890 Lymphedema, not elsewhere classified: Secondary | ICD-10-CM | POA: Diagnosis not present

## 2019-10-10 NOTE — Therapy (Signed)
[place], [place], [address] Phone: [phone]   Fax:  [phone]  Physical Therapy Treatment  Patient Details  Name: Melissa [name] MRN: [id number] Date of Birth: Apr 11, 1961 Referring Provider (PT): Dr. Lindi Adie   Encounter Date: 10/10/2019  PT End of Session - 10/10/19 1059    Visit Number  4    Number of Visits  4    Date for PT Re-Evaluation  10/18/19    PT Start Time  1005    PT Stop Time  1058    PT Time Calculation (min)  53 min    Activity Tolerance  Patient tolerated treatment well    Behavior During Therapy  [facility] for tasks assessed/performed       Past Medical History:  Diagnosis Date  . Breast cancer ([place]) 04/2017   left breast  . Cervical cancer ([place])    1990, treated with hysterectomy only  . Family history of breast cancer   . Family history of prostate cancer   . GERD (gastroesophageal reflux disease)   . History of radiation therapy 12/09/17- 01/12/18   Left Breast treated to 50 Gy with 25 fx of 2 Gy  . Hypercholesteremia   . Hypertension   . Personal history of chemotherapy   . Personal history of radiation therapy     Past Surgical History:  Procedure Laterality Date  . ABDOMINAL HYSTERECTOMY  1990  . AXILLARY LYMPH NODE DISSECTION Left 10/14/2017   Procedure: LEFT AXILLARY LYMPH NODE DISSECTION;  Surgeon: Rolm Bookbinder, MD;  Location: [place];  Service: General;  Laterality: Left;  . BREAST LUMPECTOMY Left    04/2017  . EYE SURGERY     eye lift  . PORTACATH PLACEMENT Right 05/12/2017   Procedure: INSERTION PORT-A-CATH WITH ULTRASOUND;  Surgeon: Rolm Bookbinder, MD;  Location: [place];  Service: General;  Laterality: Right;    There were no vitals filed for this visit.  Subjective Assessment - 10/10/19 1023    Subjective  I'm 3 weeks into wearing my compression sleeve and gauntlet daily and that has really been helping. I can  tell my arm is sweling anymore.    Pertinent History  Pt completed neoadjuvant chemotherapy 05/12/17-08/31/17 followed by left ALND 0/13 nodes negative.  No breast surgery performed due to no breast primary every indentified.  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 Radiation also completed.  Currently on anti-estrogens.    Patient Stated Goals  what to do for lymphedema    Currently in Pain?  No/denies                       [facility] Adult PT Treatment/Exercise - 10/10/19 0001      Exercises   Exercises  Other Exercises    Other Exercises   Began instruction of Strength ABC Program getting through and including one armed rows. Pt required multiple tactile and VCs throughout, including demo of each, for correct technique and to be assured she was performing each exercise correctly.                   PT Long Term Goals - 09/20/19 1413      PT LONG TERM GOAL #1   Title  Pt will be independent with lymphedema self care including use of compression, self MLD, and risk reduction    Time  4    Period  Weeks  Status  New      PT LONG TERM GOAL #2   Title  Pt will be independent with strength ABC for home use to increase strength    Time  4    Period  Weeks    Status  New      PT LONG TERM GOAL #3   Title  Pt will return to baseline of no pain in the arm at night with use of compression and self MLD    Time  4    Period  Weeks    Status  New            Plan - 10/10/19 1101    Clinical Impression Statement  Began instruction of Strength ABC Program today which pt tolerated very well though does require multiple tactile and VCs throughout for correct positioning and engagement of core. She reports feeling this will be very beneficial to help continue her recovery.    Personal Factors and Comorbidities  Comorbidity 2    Comorbidities  radiation history and ALND    Stability/Clinical Decision Making  Evolving/Moderate  complexity    Rehab Potential  Excellent    PT Frequency  1x / week    PT Duration  4 weeks    PT Treatment/Interventions  ADLs/Self Care Home Management;Compression bandaging;Manual lymph drainage;Manual techniques;Therapeutic exercise;Patient/family education    PT Next Visit Plan  Renewal next session; complete instruction of Strength ABC Program; review self MLD, check measurements, remind her she can get compression tank or bra and night time if wanted.  See if pt needs more visits for PT MLD/mld instruction or for strength and ROM    PT Home Exercise Plan  self MLD, wear garments daily, and incorporate end ROM Lt shoulder stretching into day    Consulted and Agree with Plan of Care  Patient       Patient will benefit from skilled therapeutic intervention in order to improve the following deficits and impairments:  Pain, Postural dysfunction, Decreased activity tolerance, Decreased strength, Impaired UE functional use, Increased edema  Visit Diagnosis: Lymphedema  Pain in left arm     Problem List Patient Active Problem List   Diagnosis Date Noted  . Genetic testing 09/16/2018  . Family history of breast cancer   . Family history of prostate cancer   . Secondary malignant neoplasm of axillary lymph nodes ([place]) 11/17/2017  . Breast cancer, left ([place]) 10/14/2017  . Port catheter in place 07/20/2017  . 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 Malignant (primary) neoplasm, unspecified ([place]) 04/26/2017  . 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[place]) 04/21/2017    [name], PTA 10/10/2019, 11:04 AM  [place], [place], [address] Phone: [phone]   Fax:  [phone]  Name: Melissa [name] MRN: [id number] Date of Birth: 1961/10/30

## 2019-10-24 ENCOUNTER — Ambulatory Visit: Payer: 59

## 2019-10-25 ENCOUNTER — Ambulatory Visit: Payer: 59

## 2019-10-31 ENCOUNTER — Other Ambulatory Visit: Payer: Self-pay

## 2019-10-31 ENCOUNTER — Ambulatory Visit: Payer: 59

## 2019-10-31 DIAGNOSIS — I890 Lymphedema, not elsewhere classified: Secondary | ICD-10-CM | POA: Diagnosis not present

## 2019-10-31 DIAGNOSIS — M25612 Stiffness of left shoulder, not elsewhere classified: Secondary | ICD-10-CM

## 2019-10-31 DIAGNOSIS — M79602 Pain in left arm: Secondary | ICD-10-CM

## 2019-10-31 NOTE — Therapy (Signed)
[place], [place], [address] Phone: [phone]   Fax:  [phone]  Physical Therapy Treatment  Patient Details  Name: Melissa [name] MRN: [id number] Date of Birth: Mar 26, 1961 Referring Provider (PT): Dr. Lindi Adie   Encounter Date: 10/31/2019  PT End of Session - 10/31/19 1213    Visit Number  5    Number of Visits  8    Date for PT Re-Evaluation  11/28/19    PT Start Time  1102    PT Stop Time  1159    PT Time Calculation (min)  57 min    Activity Tolerance  Patient tolerated treatment well    Behavior During Therapy  [facility] for tasks assessed/performed       Past Medical History:  Diagnosis Date  . Breast cancer ([place]) 04/2017   left breast  . Cervical cancer ([place])    1990, treated with hysterectomy only  . Family history of breast cancer   . Family history of prostate cancer   . GERD (gastroesophageal reflux disease)   . History of radiation therapy 12/09/17- 01/12/18   Left Breast treated to 50 Gy with 25 fx of 2 Gy  . Hypercholesteremia   . Hypertension   . Personal history of chemotherapy   . Personal history of radiation therapy     Past Surgical History:  Procedure Laterality Date  . ABDOMINAL HYSTERECTOMY  1990  . AXILLARY LYMPH NODE DISSECTION Left 10/14/2017   Procedure: LEFT AXILLARY LYMPH NODE DISSECTION;  Surgeon: Rolm Bookbinder, MD;  Location: [facility];  Service: General;  Laterality: Left;  . BREAST LUMPECTOMY Left    04/2017  . EYE SURGERY     eye lift  . PORTACATH PLACEMENT Right 05/12/2017   Procedure: INSERTION PORT-A-CATH WITH ULTRASOUND;  Surgeon: Rolm Bookbinder, MD;  Location: [place] [facility];  Service: General;  Laterality: Right;    There were no vitals filed for this visit.  Subjective Assessment - 10/31/19 1106    Subjective  I've continued stretching at home and I can tell the tightness is some better. I want to come for a few  more visits to finish learning the Strength ABC program and help my tightness get better.    Pertinent History  Pt completed neoadjuvant chemotherapy 05/12/17-08/31/17 followed by left ALND 0/13 nodes negative.  No breast surgery performed due to no breast primary every indentified.  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 Radiation also completed.  Currently on anti-estrogens.    Patient Stated Goals  what to do for lymphedema    Currently in Pain?  No/denies         [facility] PT Assessment - 10/31/19 0001      AROM   Left Shoulder Flexion  145 Degrees    Left Shoulder ABduction  158 Degrees        LYMPHEDEMA/ONCOLOGY QUESTIONNAIRE - 10/31/19 1116      Left Upper Extremity Lymphedema   15 cm Proximal to Olecranon Process  28.7 cm    10 cm Proximal to Olecranon Process  27.7 cm    Olecranon Process  23.6 cm    15 cm Proximal to Ulnar Styloid Process  22.6 cm    10 cm Proximal to Ulnar Styloid Process  19.6 cm    Just Proximal to Ulnar Styloid Process  14.2 cm    Across Hand at [employer]  17 cm    At  Base of 2nd Digit  5.8 cm                [facility] Adult PT Treatment/Exercise - 10/31/19 0001      Exercises   Other Exercises   Completed Strength ABC Program with pt returning demo of each from dead lift to bicep curls.      Manual Therapy   Myofascial Release  To Lt axilla at end of P/ROM    Passive ROM  In Supine to Lt shoulder into flexion, abduction and D2 to pts tolerance, she is still limited by tightness at end ROMs but no longer reports tenderness                  PT Long Term Goals - 10/31/19 1126      PT LONG TERM GOAL #1   Title  Pt will be independent with lymphedema self care including use of compression, self MLD, and risk reduction    Baseline  Pt wearing her compression daily and self MLD each night- 10/31/19    Status  Achieved      PT LONG TERM GOAL #2   Title  Pt will be independent with strength  ABC for home use to increase strength    Baseline  Copleted instruction of this today, pt needs further review for technique- 10/31/19    Status  Partially Met      PT LONG TERM GOAL #3   Title  Pt will return to baseline of no pain in the arm at night with use of compression and self MLD    Baseline  At this time pt reports 70% improved -10/31/19    Status  On-going      PT LONG TERM GOAL #4   Title  A/ROM of Lt shoulder abduction will improve to 170 degrees for increased            Plan - 10/31/19 1214    Clinical Impression Statement  Pt returns to therapy today after 3 weeks off with working on HEP stretching and needed to babysit her grandson. She is progressing well towards no pain at night reporting 70% improvement at this time, but still does feel some pain each night. Her A/ROM has improved some since last time measured and her circumference measurements have seemed to reduce back to baseline. She is doing well with being compliant with wear of her compression sleeve and gauntlet daily and doing self MLD. She would like to continue and would benefit from further physical therapy to help gain near full ROM and decrease tightness at Lt axilla and lateral trunk. Renewal done today for 1x/wk x4 weeks. Also faxed updated compression garment order to be signed to Dr. Lindi Adie at pts request as she would like to order more gauntlets but her order has expired from last year.    Personal Factors and Comorbidities  Comorbidity 2    Comorbidities  radiation history and ALND    Stability/Clinical Decision Making  Evolving/Moderate complexity    Rehab Potential  Excellent    PT Frequency  1x / week    PT Duration  4 weeks    PT Treatment/Interventions  ADLs/Self Care Home Management;Compression bandaging;Manual lymph drainage;Manual techniques;Therapeutic exercise;Patient/family education    PT Next Visit Plan  Renewal done this session for 1x/wk for 4 more weeks. Issue script for compression  once returned. Focus on promoting end ROM and decreasing tissue tightness of Lt upper quadrant through manual techniques. Review Strngth ABC program  prn.    PT Home Exercise Plan  self MLD, wear garments daily, and incorporate end ROM Lt shoulder stretching into day; Stength ABC Program    Consulted and Agree with Plan of Care  Patient       Patient will benefit from skilled therapeutic intervention in order to improve the following deficits and impairments:  Pain, Postural dysfunction, Decreased activity tolerance, Decreased strength, Impaired UE functional use, Increased edema  Visit Diagnosis: Lymphedema  Pain in left arm  Stiffness of left shoulder, not elsewhere classified     Problem List Patient Active Problem List   Diagnosis Date Noted  . Genetic testing 09/16/2018  . Family history of breast cancer   . Family history of prostate cancer   . 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 Breast cancer, left ([place]) 10/14/2017  . Port catheter in place 07/20/2017  . 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 Malignant (primary) neoplasm, unspecified ([place]) 04/26/2017  . 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[place]) 04/21/2017    [name], PTA 10/31/2019, 12:32 PM  [place], [place], [address] Phone: [phone]   Fax:  [phone]  Name: Melissa [name] MRN: [id number] Date of Birth: 01-18-1961

## 2019-11-07 ENCOUNTER — Other Ambulatory Visit: Payer: Self-pay

## 2019-11-07 ENCOUNTER — Ambulatory Visit: Payer: 59 | Attending: Hematology and Oncology

## 2019-11-07 DIAGNOSIS — M79602 Pain in left arm: Secondary | ICD-10-CM | POA: Insufficient documentation

## 2019-11-07 DIAGNOSIS — M25612 Stiffness of left shoulder, not elsewhere classified: Secondary | ICD-10-CM

## 2019-11-07 DIAGNOSIS — I890 Lymphedema, not elsewhere classified: Secondary | ICD-10-CM

## 2019-11-07 NOTE — Therapy (Signed)
[place], [place], [address] Phone: [phone]   Fax:  [phone]  Physical Therapy Treatment  Patient Details  Name: Melissa [name] MRN: [id number] Date of Birth: 1961-10-07 Referring Provider (PT): Dr. Lindi Adie   Encounter Date: 11/07/2019  PT End of Session - 11/07/19 1208    Visit Number  6    Number of Visits  8    Date for PT Re-Evaluation  11/28/19    PT Start Time  1106    PT Stop Time  1203    PT Time Calculation (min)  57 min    Activity Tolerance  Patient tolerated treatment well    Behavior During Therapy  [facility] for tasks assessed/performed       Past Medical History:  Diagnosis Date  . Breast cancer ([place]) 04/2017   left breast  . Cervical cancer ([place])    1990, treated with hysterectomy only  . Family history of breast cancer   . Family history of prostate cancer   . GERD (gastroesophageal reflux disease)   . History of radiation therapy 12/09/17- 01/12/18   Left Breast treated to 50 Gy with 25 fx of 2 Gy  . Hypercholesteremia   . Hypertension   . Personal history of chemotherapy   . Personal history of radiation therapy     Past Surgical History:  Procedure Laterality Date  . ABDOMINAL HYSTERECTOMY  1990  . AXILLARY LYMPH NODE DISSECTION Left 10/14/2017   Procedure: LEFT AXILLARY LYMPH NODE DISSECTION;  Surgeon: Rolm Bookbinder, MD;  Location: [place];  Service: General;  Laterality: Left;  . BREAST LUMPECTOMY Left    04/2017  . EYE SURGERY     eye lift  . PORTACATH PLACEMENT Right 05/12/2017   Procedure: INSERTION PORT-A-CATH WITH ULTRASOUND;  Surgeon: Rolm Bookbinder, MD;  Location: [facility];  Service: General;  Laterality: Right;    There were no vitals filed for this visit.  Subjective Assessment - 11/07/19 1109    Subjective  My lymphedema is doing great in my Lt arm. My Lt shoulder is always just a little tight.    Pertinent  History  Pt completed neoadjuvant chemotherapy 05/12/17-08/31/17 followed by left ALND 0/13 nodes negative.  No breast surgery performed due to no breast primary every indentified.  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 Radiation also completed.  Currently on anti-estrogens.    Patient Stated Goals  what to do for lymphedema    Currently in Pain?  No/denies                       [facility] Adult PT Treatment/Exercise - 11/07/19 0001      Shoulder Exercises: Supine   Horizontal ABduction  Strengthening;Both;5 reps;Theraband    Theraband Level (Shoulder Horizontal ABduction)  Level 2 (Red)    External Rotation  Strengthening;Both;Theraband;10 reps    Theraband Level (Shoulder External Rotation)  Level 2 (Red)    External Rotation Limitations  VCs to keep uppera arm at her sides and decrease lumbar extension at end motion    Flexion  Strengthening;Both;5 reps;Theraband   Narrow and Wide Grip, 5 reps each   Theraband Level (Shoulder Flexion)  Level 2 (Red)    Flexion Limitations  VCs throughout to decrease lumbar extension at end motion    Diagonals  Strengthening;Right;Left;5 reps;Theraband    Theraband Level (Shoulder Diagonals)  Level 2 (Red)    Diagonals  Limitations  VCs to decrease lumbar extension and end Lt shoulder ROM, also to engage core for increase core stabilization      Manual Therapy   Myofascial Release  To Lt axilla at end of P/ROM    Passive ROM  In Supine to Lt shoulder into flexion, abduction with and without er  and D2 to pts tolerance             PT Education - 11/07/19 1209    Education Details  Supine scapular series with red theraband    Person(s) Educated  Patient    Methods  Explanation;Demonstration;Handout    Comprehension  Verbalized understanding;Returned demonstration;Verbal cues required;Need further instruction          PT Long Term Goals - 10/31/19 1126      PT LONG TERM GOAL #1   Title  Pt  will be independent with lymphedema self care including use of compression, self MLD, and risk reduction    Baseline  Pt wearing her compression daily and self MLD each night- 10/31/19    Status  Achieved      PT LONG TERM GOAL #2   Title  Pt will be independent with strength ABC for home use to increase strength    Baseline  Copleted instruction of this today, pt needs further review for technique- 10/31/19    Status  Partially Met      PT LONG TERM GOAL #3   Title  Pt will return to baseline of no pain in the arm at night with use of compression and self MLD    Baseline  At this time pt reports 70% improved -10/31/19    Status  On-going      PT LONG TERM GOAL #4   Title  A/ROM of Lt shoulder will improve to 160 degrees flexion and 170 degrees abduction for better ease with overhead reaching with ADLs.    Baseline  Lt shoulder flexion 145 and abduction 158 degrees - 10/31/19    Time  4    Period  Weeks    Status  New            Plan - 11/07/19 1209    Clinical Impression Statement  Progressed pt to include supine scapular series to HEP. She tolerated this well overall but did require multiple VCs to decrease lumbar extension with most motions and to engage core for increased core stability. Continued with manual therapy to promote improved end ROM which is still fairly limited though does seem to be improving as evidenced by pt able to report great ease with reaching into higher shelves. Faxed pts signed script to [facility] at hre request as she has already been measured for new garments and they were just awaiting updated order.    Personal Factors and Comorbidities  Comorbidity 2    Comorbidities  radiation history and ALND    Stability/Clinical Decision Making  Evolving/Moderate complexity    Rehab Potential  Excellent    PT Frequency  1x / week    PT Duration  4 weeks    PT Treatment/Interventions  ADLs/Self Care Home Management;Compression bandaging;Manual lymph  drainage;Manual techniques;Therapeutic exercise;Patient/family education    PT Next Visit Plan  Review Strength ABC packet prn if questions and supine scapular series review to assess technique. Cont manual therapy to improve pts shoulder ROM and work towards decreasing myofascial tightness at axilla all in Lt upper quadrant.    PT Home Exercise Plan  self MLD,  wear garments daily, and incorporate end ROM Lt shoulder stretching into day; Stength ABC Program; supine scapular series    Recommended Other Services  Script faxed to [facility] at pts request for compression garments    Consulted and Agree with Plan of Care  Patient       Patient will benefit from skilled therapeutic intervention in order to improve the following deficits and impairments:  Pain, Postural dysfunction, Decreased activity tolerance, Decreased strength, Impaired UE functional use, Increased edema  Visit Diagnosis: Lymphedema  Pain in left arm  Stiffness of left shoulder, not elsewhere classified     Problem List Patient Active Problem List   Diagnosis Date Noted  . Genetic testing 09/16/2018  . Family history of breast cancer   . Family history of prostate cancer   . 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 Breast cancer, left ([place]) 10/14/2017  . Port catheter in place 07/20/2017  . 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 Malignant (primary) neoplasm, unspecified ([place]) 04/26/2017  . 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[place]) 04/21/2017    [name], PTA 11/07/2019, 12:15 PM  [place], [place], [address] Phone: [phone]   Fax:  [phone]  Name: Melissa [name] MRN: [id number] Date of Birth: 09-08-1961

## 2019-11-07 NOTE — Patient Instructions (Signed)
Over Head Pull: Narrow and Wide Grip   Cancer Rehab [phone]   On back, knees bent, feet flat, band across thighs, elbows straight but relaxed. Pull hands apart (start). Keeping elbows straight, bring arms up and over head, hands toward floor. Keep pull steady on band. Hold momentarily. Return slowly, keeping pull steady, back to start. Then do same with a wider grip on the band (past shoulder width). Don't arch at end of motions. Repeat _5-10__ times. Band color __red____   Side Pull: Double Arm   On back, knees bent, feet flat. Arms perpendicular to body, shoulder level, elbows straight but relaxed. Pull arms out to sides, elbows straight. Resistance band comes across collarbones, hands toward floor. Hold momentarily. Slowly return to starting position. Repeat _5-10__ times. Band color _red____   Sword   On back, knees bent, feet flat, left hand on left hip, right hand above left. Pull right arm DIAGONALLY (hip to shoulder) across chest. Bring right arm along head toward floor. Hold momentarily. Slowly return to starting position. Repeat _5-10__ times. Do with left arm. Band color _red_____   Shoulder Rotation: Double Arm   On back, knees bent, feet flat, elbows tucked at sides, bent 90, hands palms up. Pull hands apart and down toward floor, keeping elbows near sides. Hold momentarily. Slowly return to starting position. Keep upper arms at sides and don't arch. Repeat _5-10__ times. Band color __red____

## 2019-11-09 ENCOUNTER — Ambulatory Visit
Admission: RE | Admit: 2019-11-09 | Discharge: 2019-11-09 | Disposition: A | Discharge status: Home / Self Care | Payer: 59 | Source: Ambulatory Visit | Attending: Hematology and Oncology | Admitting: Hematology and Oncology

## 2019-11-09 DIAGNOSIS — Z1231 Encounter for screening mammogram for malignant neoplasm of breast: Secondary | ICD-10-CM

## 2019-11-09 MED ORDER — GADOBUTROL 1 MMOL/ML IV SOLN
7.0000 mL | Freq: Once | INTRAVENOUS | Status: AC | PRN
  Administered 2019-11-09: 7 mL via INTRAVENOUS

## 2019-11-13 ENCOUNTER — Telehealth: Payer: Self-pay | Admitting: Hematology and Oncology

## 2019-11-13 ENCOUNTER — Other Ambulatory Visit: Payer: Self-pay

## 2019-11-13 ENCOUNTER — Ambulatory Visit: Payer: 59

## 2019-11-13 DIAGNOSIS — M79602 Pain in left arm: Secondary | ICD-10-CM

## 2019-11-13 DIAGNOSIS — I890 Lymphedema, not elsewhere classified: Secondary | ICD-10-CM

## 2019-11-13 DIAGNOSIS — M25612 Stiffness of left shoulder, not elsewhere classified: Secondary | ICD-10-CM

## 2019-11-13 NOTE — Telephone Encounter (Signed)
I informed the patient of the breast MRI is normal

## 2019-11-13 NOTE — Therapy (Signed)
[place], [place], [address] Phone: [phone]   Fax:  [phone]  Physical Therapy Treatment  Patient Details  Name: Melissa [name] MRN: [id number] Date of Birth: July 26, 1961 Referring Provider (PT): Dr. Lindi Adie   Encounter Date: 11/13/2019  PT End of Session - 11/13/19 1002    Visit Number  7    Number of Visits  8    Date for PT Re-Evaluation  11/28/19    PT Start Time  0905    PT Stop Time  1002    PT Time Calculation (min)  57 min    Activity Tolerance  Patient tolerated treatment well    Behavior During Therapy  [facility] for tasks assessed/performed       Past Medical History:  Diagnosis Date  . Breast cancer ([place]) 04/2017   left breast  . Cervical cancer ([place])    1990, treated with hysterectomy only  . Family history of breast cancer   . Family history of prostate cancer   . GERD (gastroesophageal reflux disease)   . History of radiation therapy 12/09/17- 01/12/18   Left Breast treated to 50 Gy with 25 fx of 2 Gy  . Hypercholesteremia   . Hypertension   . Personal history of chemotherapy   . Personal history of radiation therapy     Past Surgical History:  Procedure Laterality Date  . ABDOMINAL HYSTERECTOMY  1990  . AXILLARY LYMPH NODE DISSECTION Left 10/14/2017   Procedure: LEFT AXILLARY LYMPH NODE DISSECTION;  Surgeon: Rolm Bookbinder, MD;  Location: [place];  Service: General;  Laterality: Left;  . BREAST LUMPECTOMY Left    04/2017  . EYE SURGERY     eye lift  . PORTACATH PLACEMENT Right 05/12/2017   Procedure: INSERTION PORT-A-CATH WITH ULTRASOUND;  Surgeon: Rolm Bookbinder, MD;  Location: [facility];  Service: General;  Laterality: Right;    There were no vitals filed for this visit.  Subjective Assessment - 11/13/19 0910    Subjective  I have been working on getting more consistent with my HEP and it is really helping. I did alot just staying  busy at home and normally that would've made me really achy but it didn't bother me at all and I am not even aware of it now which is big for me.    Pertinent History  Pt completed neoadjuvant chemotherapy 05/12/17-08/31/17 followed by left ALND 0/13 nodes negative.  No breast surgery performed due to no breast primary every indentified.  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 Radiation also completed.  Currently on anti-estrogens.    Patient Stated Goals  what to do for lymphedema    Currently in Pain?  No/denies         [facility] PT Assessment - 11/13/19 0001      AROM   Left Shoulder Flexion  150 Degrees    Left Shoulder ABduction  166 Degrees                   [facility] Adult PT Treatment/Exercise - 11/13/19 0001      Manual Therapy   Myofascial Release  To Lt axilla at end of P/ROM    Passive ROM  In Supine to Lt shoulder into flexion, abduction with and without er  and D2 to pts tolerance                  PT Long Term Goals -  10/31/19 1126      PT LONG TERM GOAL #1   Title  Pt will be independent with lymphedema self care including use of compression, self MLD, and risk reduction    Baseline  Pt wearing her compression daily and self MLD each night- 10/31/19    Status  Achieved      PT LONG TERM GOAL #2   Title  Pt will be independent with strength ABC for home use to increase strength    Baseline  Copleted instruction of this today, pt needs further review for technique- 10/31/19    Status  Partially Met      PT LONG TERM GOAL #3   Title  Pt will return to baseline of no pain in the arm at night with use of compression and self MLD    Baseline  At this time pt reports 70% improved -10/31/19    Status  On-going      PT LONG TERM GOAL #4   Title  A/ROM of Lt shoulder will improve to 160 degrees flexion and 170 degrees abduction for better ease with overhead reaching with ADLs.    Baseline  Lt shoulder flexion 145 and  abduction 158 degrees - 10/31/19    Time  4    Period  Weeks    Status  New            Plan - 11/13/19 1002    Clinical Impression Statement  Pt did not have any questions about newer HEP so focused on manual therapy today working to promote decreased tissue tightness. Her A/ROM has improved since last time measured and her P/ROM is improving as well.    Personal Factors and Comorbidities  Comorbidity 2    Comorbidities  radiation history and ALND    Stability/Clinical Decision Making  Evolving/Moderate complexity    Rehab Potential  Excellent    PT Frequency  1x / week    PT Duration  4 weeks    PT Treatment/Interventions  ADLs/Self Care Home Management;Compression bandaging;Manual lymph drainage;Manual techniques;Therapeutic exercise;Patient/family education    PT Next Visit Plan  Cont to review and answer any questions regarding HEP, cont manual therapy and plan to D/C next 1-2 visits.    PT Home Exercise Plan  self MLD, wear garments daily, and incorporate end ROM Lt shoulder stretching into day; Stength ABC Program; supine scapular series    Consulted and Agree with Plan of Care  Patient       Patient will benefit from skilled therapeutic intervention in order to improve the following deficits and impairments:  Pain, Postural dysfunction, Decreased activity tolerance, Decreased strength, Impaired UE functional use, Increased edema  Visit Diagnosis: Lymphedema  Pain in left arm  Stiffness of left shoulder, not elsewhere classified     Problem List Patient Active Problem List   Diagnosis Date Noted  . Genetic testing 09/16/2018  . Family history of breast cancer   . Family history of prostate cancer   . 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 Breast cancer, left ([place]) 10/14/2017  . Port catheter in place 07/20/2017  . 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  Malignant (primary) neoplasm, unspecified ([place]) 04/26/2017  . 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[place]) 04/21/2017    [name], PTA 11/13/2019, 10:06 AM  [place]  [place], [place], [address] Phone: [phone]   Fax:  [phone]  Name: AIRIANNA KREISCHER MRN: [id number] Date of Birth: 04-19-1961

## 2019-11-21 ENCOUNTER — Ambulatory Visit: Payer: 59

## 2019-11-21 ENCOUNTER — Other Ambulatory Visit: Payer: Self-pay

## 2019-11-21 DIAGNOSIS — M25612 Stiffness of left shoulder, not elsewhere classified: Secondary | ICD-10-CM

## 2019-11-21 DIAGNOSIS — I890 Lymphedema, not elsewhere classified: Secondary | ICD-10-CM | POA: Diagnosis not present

## 2019-11-21 DIAGNOSIS — M79602 Pain in left arm: Secondary | ICD-10-CM

## 2019-11-21 NOTE — Therapy (Addendum)
[place], [place], [address] Phone: [phone]   Fax:  [phone]  Physical Therapy Treatment  Patient Details  Name: Melissa [name] MRN: [id number] Date of Birth: [birth year] Referring Provider (PT): Dr. Lindi Adie   Encounter Date: 11/21/2019  PT End of Session - 11/21/19 1106    Visit Number  8    Number of Visits  8    Date for PT Re-Evaluation  11/28/19    PT Start Time  1008    PT Stop Time  1102    PT Time Calculation (min)  54 min    Activity Tolerance  Patient tolerated treatment well    Behavior During Therapy  [facility] for tasks assessed/performed       Past Medical History:  Diagnosis Date  . Breast cancer ([place]) 04/2017   left breast  . Cervical cancer ([place])    1990, treated with hysterectomy only  . Family history of breast cancer   . Family history of prostate cancer   . GERD (gastroesophageal reflux disease)   . History of radiation therapy 12/09/17- 01/12/18   Left Breast treated to 50 Gy with 25 fx of 2 Gy  . Hypercholesteremia   . Hypertension   . Personal history of chemotherapy   . Personal history of radiation therapy     Past Surgical History:  Procedure Laterality Date  . ABDOMINAL HYSTERECTOMY  1990  . AXILLARY LYMPH NODE DISSECTION Left 10/14/2017   Procedure: LEFT AXILLARY LYMPH NODE DISSECTION;  Surgeon: Rolm Bookbinder, MD;  Location: [facility];  Service: General;  Laterality: Left;  . BREAST LUMPECTOMY Left    04/2017  . EYE SURGERY     eye lift  . PORTACATH PLACEMENT Right 05/12/2017   Procedure: INSERTION PORT-A-CATH WITH ULTRASOUND;  Surgeon: Rolm Bookbinder, MD;  Location: [facility];  Service: General;  Laterality: Right;    There were no vitals filed for this visit.  Subjective Assessment - 11/21/19 1013    Subjective  Overall my Lt shoulder is doing better except I can tell on the days I watch my grandson my shoulder is  sore that night. I've also started feeling this knot at the Rt side of my mid upper back and I think it's because I've been lifting him (granson) over head to look at the windchimes in my house I recently hung.    Pertinent History  Pt completed neoadjuvant chemotherapy 05/12/17-08/31/17 followed by left ALND 0/13 nodes negative.  No breast surgery performed due to no breast primary every indentified.  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 Radiation also completed.  Currently on anti-estrogens.    Patient Stated Goals  what to do for lymphedema    Currently in Pain?  No/denies         [facility] PT Assessment - 11/21/19 0001      AROM   Left Shoulder Flexion  155 Degrees    Left Shoulder ABduction  170 Degrees                   [facility] Adult PT Treatment/Exercise - 11/21/19 0001      Manual Therapy   Myofascial Release  To Lt axilla at end of P/ROM    Passive ROM  In Supine to Lt shoulder into flexion, abduction with and without er  and D2 to pts tolerance  PT Long Term Goals - 11/21/19 1103      PT LONG TERM GOAL #1   Title  Pt will be independent with lymphedema self care including use of compression, self MLD, and risk reduction    Baseline  Pt wearing her compression daily and self MLD each night- 10/31/19    Status  Achieved      PT LONG TERM GOAL #2   Title  Pt will be independent with strength ABC for home use to increase strength    Baseline  Copleted instruction of this today, pt needs further review for technique- 10/31/19; pt has performed this a few times at home now and has no questions-11/21/19    Status  Achieved      PT LONG TERM GOAL #3   Title  Pt will return to baseline of no pain in the arm at night with use of compression and self MLD    Baseline  At this time pt reports 70% improved -10/31/19; no arm pain at night, just ocassional soreness at end of day when she watches her grandson -  11/21/19    Status  Achieved      PT LONG TERM GOAL #4   Title  A/ROM of Lt shoulder will improve to 160 degrees flexion and 170 degrees abduction for better ease with overhead reaching with ADLs.    Baseline  Lt shoulder flexion 145 and abduction 158 degrees - 10/31/19; flexion 155 and abduction 170 degrees - 11/21/19    Status  Partially Met            Plan - 11/21/19 1106    Clinical Impression Statement  Pt has done very well with this episode of therapy. She feels better equipped to manage her lymphedema symotoms now and her A/ROM goals have been partially met with abduction, flexion greatly improved and only lacking 5 degrees from goal met. Pt ready for D/C at this time.    Personal Factors and Comorbidities  Comorbidity 2    Comorbidities  radiation history and ALND    Stability/Clinical Decision Making  Evolving/Moderate complexity    Rehab Potential  Excellent    PT Frequency  1x / week    PT Duration  4 weeks    PT Treatment/Interventions  ADLs/Self Care Home Management;Compression bandaging;Manual lymph drainage;Manual techniques;Therapeutic exercise;Patient/family education    PT Next Visit Plan  D/C this visit.    PT Home Exercise Plan  self MLD, wear garments daily, and incorporate end ROM Lt shoulder stretching into day; Stength ABC Program; supine scapular series    Consulted and Agree with Plan of Care  Patient       Patient will benefit from skilled therapeutic intervention in order to improve the following deficits and impairments:  Pain, Postural dysfunction, Decreased activity tolerance, Decreased strength, Impaired UE functional use, Increased edema  Visit Diagnosis: Lymphedema  Pain in left arm  Stiffness of left shoulder, not elsewhere classified     Problem List Patient Active Problem List   Diagnosis Date Noted  . Genetic testing 09/16/2018  . Family history of breast cancer   . Family history of prostate cancer   . 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 Breast cancer, left ([place]) 10/14/2017  . Port catheter in place 07/20/2017  . 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 Malignant (primary) neoplasm, unspecified ([place]) 04/26/2017  . Secondary and unspecified malignant neoplasm  of axilla and upper limb lymph nodes ([place]) 04/21/2017    [name], PTA 11/21/2019, 11:09 AM  [place], [place], [address] Phone: [phone]   Fax:  [phone]  Name: Melissa [name] MRN: [id number] Date of Birth: 25-May-1961  PHYSICAL THERAPY DISCHARGE SUMMARY  Visits from Start of Care: 8  Current functional level related to goals / functional outcomes: All goals met   Remaining deficits: None   Education / Equipment: HEP, self MLD Plan: Patient agrees to discharge.  Patient goals were met. Patient is being discharged due to meeting the stated rehab goals.  ?????     Allyson Sabal [place], [place] 11/21/19 11:59 AM

## 2019-11-27 ENCOUNTER — Other Ambulatory Visit: Payer: Self-pay | Admitting: Hematology and Oncology

## 2019-12-05 ENCOUNTER — Other Ambulatory Visit: Payer: Self-pay

## 2019-12-05 MED ORDER — ZOLPIDEM TARTRATE 5 MG PO TABS: 5.0000 mg | ORAL_TABLET | Freq: Every evening | ORAL | 1 refills | Status: DC | PRN

## 2020-03-18 ENCOUNTER — Other Ambulatory Visit: Payer: Self-pay | Admitting: Hematology and Oncology

## 2020-03-18 DIAGNOSIS — Z853 Personal history of malignant neoplasm of breast: Secondary | ICD-10-CM

## 2020-04-23 ENCOUNTER — Other Ambulatory Visit: Payer: Self-pay | Admitting: *Deleted

## 2020-04-23 MED ORDER — ZOLPIDEM TARTRATE 5 MG PO TABS: 5.0000 mg | ORAL_TABLET | Freq: Every evening | ORAL | 1 refills | Status: DC | PRN

## 2020-04-30 ENCOUNTER — Other Ambulatory Visit: Payer: Self-pay

## 2020-04-30 ENCOUNTER — Ambulatory Visit
Admission: RE | Admit: 2020-04-30 | Discharge: 2020-04-30 | Disposition: A | Discharge status: Home / Self Care | Payer: 59 | Source: Ambulatory Visit | Attending: Hematology and Oncology | Admitting: Hematology and Oncology

## 2020-04-30 DIAGNOSIS — Z853 Personal history of malignant neoplasm of breast: Secondary | ICD-10-CM

## 2020-05-15 ENCOUNTER — Other Ambulatory Visit: Payer: Self-pay | Admitting: Hematology and Oncology

## 2020-05-24 ENCOUNTER — Other Ambulatory Visit: Payer: Self-pay | Admitting: Pharmacist

## 2020-06-14 ENCOUNTER — Other Ambulatory Visit: Payer: Self-pay | Admitting: Hematology and Oncology

## 2020-06-25 ENCOUNTER — Telehealth: Payer: Self-pay | Admitting: Hematology and Oncology

## 2020-06-25 NOTE — Telephone Encounter (Signed)
Rescheduled appointment per 7/1 message. Left message on patient voicemail with updated appointment time and date.

## 2020-07-14 ENCOUNTER — Other Ambulatory Visit: Payer: Self-pay | Admitting: Hematology and Oncology

## 2020-08-13 ENCOUNTER — Other Ambulatory Visit: Payer: Self-pay | Admitting: Hematology and Oncology

## 2020-10-01 NOTE — Progress Notes (Signed)
Patient Care Team: Deland Pretty, MD as PCP - General (Internal Medicine)  DIAGNOSIS:    ICD-10-CM   1. Post-menopausal  Z78.0 DG Bone Density  2. 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.3     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[place])  04/14/2017 Initial Diagnosis   Left axillary lymph node biopsy: Metastatic carcinoma positive for CK 7, ER positive,GATA-3 equivocal, negative for CK 20,GCDFP, CDX-2, TTF-1; HER-2 positive ratio 2.08; mammogram and ultrasound revealed 3 discrete abnormal left axillary lymph nodes largest measures 1.8 cm   04/23/2017 Breast MRI   Left axillary lymphadenopathy numerous enlarged level I axillary lymph nodes largest 2.1 cm, no other abdominal masses in the left breast itself, no MRI evidence of malignancy in the right breast   04/26/2017 Imaging   CT CAP: Prominent left axillary lymph nodes no distant metastases, sclerotic focus left pedicle of T9 vertebra favored benign, T5 vertebral hemangioma   05/12/2017 - 08/31/2017 Neo-Adjuvant Chemotherapy   TCH Perjeta x6 followed by Herceptin Perjeta maintenance for 1 year   10/14/2017 Surgery   Left axillary lymph node dissection: 0/13 lymph nodes complete pathologic response.  Breast was not operated on because no breast primary was ever identified   12/09/2017 - 01/11/2018 Radiation Therapy   Adjuvant radiation therapy   01/30/2018 -  Anti-estrogen oral therapy   Anastrozole 1 mg daily, Neratinib started 05/13/2018   09/09/2018 Genetic Testing   The MyRisk gene panel offered by [employer] includes sequencing and deletion/duplication testing of the following 35 genes: APC, ATM, BARD1, BMPR1A, BRCA1, BRCA2, BRIP1, CHD1, CDK4, CDKN2A, CHEK2, EPCAM (large rearrangement only), GREM1, HOXB13, AXIN2, MSH3, NTHL1, RNF43, GALNT12, RSP20, MLH1, MSH2, MSH6, MUTYH, NBN,  PALB2, PMS2, PTEN, RAD51C, RAD51D, SMAD4, STK11, and TP53. Sequencing was performed for select regions of POLE and POLD1, and large rearrangement analysis was performed for select regions of GREM1.  Results: Negative, no pathogenic variants identified.  The date of this test report is 09/09/2018.     CHIEF COMPLIANT: Follow-up of breast cancer on anastrozole   INTERVAL HISTORY: Melissa [name] is a 59 y.o. with above-mentioned history of breast cancer treated with neoadjuvant chemotherapy, left axillary lymph node dissection, radiation, adjuvant Herceptin Perjeta maintenance, neratinib, and is currently on antiestrogen therapy with anastrozole. Mammogram on 04/30/20 showed no evidence of malignancy bilaterally. She presents to the clinic today for follow-up.    ALLERGIES:  has No Known Allergies.  MEDICATIONS:  Current Outpatient Medications  Medication Sig Dispense Refill  . anastrozole (ARIMIDEX) 1 MG tablet Take 1 tablet (1 mg total) by mouth daily. 90 tablet 3  . calcium carbonate (OS-CAL) 1250 (500 Ca) MG chewable tablet Chew 1 tablet by mouth daily. Calcium 600 mg    . cholecalciferol (VITAMIN D) 1000 units tablet Take 1,000 Units by mouth daily. 2000 iu    . rosuvastatin (CRESTOR) 10 MG tablet Take 10 mg by mouth daily.   0  . triamterene-hydrochlorothiazide (MAXZIDE-25) 37.5-25 MG tablet Take 1 tablet by mouth daily.    Marland Kitchen zinc gluconate 50 MG tablet Take 1 tablet (50 mg total) by mouth daily.    Marland Kitchen zolpidem (AMBIEN) 5 MG tablet Take 1 tablet (5 mg total) by mouth at bedtime as needed. for sleep 90 tablet 1   No current facility-administered medications for this visit.    PHYSICAL EXAMINATION: ECOG PERFORMANCE STATUS: 1 -  Symptomatic but completely ambulatory  Vitals:   10/02/20 1528  BP: (!) 141/79  Pulse: 67  Resp: 18  Temp: (!) 97.3 F (36.3 C)  SpO2: 100%   Filed Weights   10/02/20 1528  Weight: 152 lb 14.4 oz (69.4 kg)    BREAST: No palpable masses or nodules in  either right or left breasts. No palpable axillary supraclavicular or infraclavicular adenopathy no breast tenderness or nipple discharge. (exam performed in the presence of a chaperone)  LABORATORY DATA:  I have reviewed the data as listed CMP Latest Ref Rng & Units 03/01/2019 01/30/2019 12/01/2018  Glucose 70 - 99 mg/dL 87 96 93  BUN 6 - 20 mg/dL 22(H) 20 17  Creatinine 0.44 - 1.00 mg/dL 0.85 1.04(H) 1.00  Sodium 135 - 145 mmol/L 139 140 141  Potassium 3.5 - 5.1 mmol/L 3.5 4.0 4.1  Chloride 98 - 111 mmol/L 98 101 105  CO2 22 - 32 mmol/L _0 Calcium 8.9 - 10.3 mg/dL 9.6 9.9 9.8  Total Protein 6.5 - 8.1 g/dL 7.6 7.7 6.9  Total Bilirubin 0.3 - 1.2 mg/dL 0.4 0.5 0.3  Alkaline Phos 38 - 126 U/L 89 93 97  AST 15 - 41 U/L _1 ALT 0 - 44 U/L _2 Lab Results  Component Value Date   WBC 6.1 03/01/2019   HGB 14.6 03/01/2019   HCT 43.7 03/01/2019   MCV 90.7 03/01/2019   PLT 352 03/01/2019   NEUTROABS 2.9 03/01/2019    ASSESSMENT & PLAN:  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[place]) 04/14/2017 Left axillary lymph node biopsy: Metastatic carcinoma positive for CK 7, ER positive,GATA-3 equivocal, negative for CK 20,GCDFP, CDX-2, TTF-1; HER-2 positive ratio 2.08 mammogram and ultrasound revealed 3 discrete abnormal left axillary lymph nodes largest measures 1.8 cm Clinical stage: TX N1MX  Treatment plan: 1. Neoadjuvant TCH Perjeta 6 cycles completed 08/31/2017 followed by Herceptin, Perjeta maintenance for 1 year 2. followed by axillary lymph node dissection. Breast surgery will not be performed because there is no primary tumor identifiable on breast MRI: 10/14/17: 0/13 LN Negative 3. Followed by radiation 12/08/2017-01/12/2018 4. Followed by antiestrogen therapy started 01/30/2018 5.Herceptin Perjeta maintenance completed 05/10/2018 6.Neratinib started 6/7/2019discontinued  04/03/2019 ------------------------------------------------------------------------------------------------------------------ Current treatment: Anastrozole  Anastrozole toxicities: Tolerating it reasonably well.  She does have some joint stiffness in the morning but she gets better when she moves. Occasional hot flashes but they are much better than before.  Breast cancer surveillance: 1.  Breast exam 10/02/2020: Benign 2. Mammograms done May 2021: Benign 3.  Breast MRI November 2020: Benign: This being done every other year.  Her daughter now works in the [place] [facility] infusion center. Return to clinic in 1 year for follow-up.    Orders Placed This Encounter  Procedures  . DG Bone Density    Standing Status:   Future    Standing Expiration Date:   10/02/2021    Order Specific Question:   Reason for Exam (SYMPTOM  OR DIAGNOSIS REQUIRED)    Answer:   Post menopausal    Order Specific Question:   Preferred imaging location?    Answer:   [facility]   The patient has a good understanding of the overall plan. she agrees with it. she will call with any problems that may develop before the next visit here.  Total time spent: 20 mins including face to face time and time spent for planning,  charting and coordination of care  Nicholas Lose, MD 10/02/2020  Julious Oka Dorshimer, am acting as scribe for Dr. Nicholas Lose.  I have reviewed the above documentation for accuracy and completeness, and I agree with the above.

## 2020-10-02 ENCOUNTER — Other Ambulatory Visit: Payer: Self-pay

## 2020-10-02 ENCOUNTER — Inpatient Hospital Stay: Payer: 59 | Attending: Hematology and Oncology | Admitting: Hematology and Oncology

## 2020-10-02 VITALS — BP 141/79 | HR 67 | Temp 97.3°F | Resp 18 | Ht 68.0 in | Wt 152.9 lb

## 2020-10-02 DIAGNOSIS — Z923 Personal history of irradiation: Secondary | ICD-10-CM | POA: Insufficient documentation

## 2020-10-02 DIAGNOSIS — Z9221 Personal history of antineoplastic chemotherapy: Secondary | ICD-10-CM | POA: Insufficient documentation

## 2020-10-02 DIAGNOSIS — C801 Malignant (primary) neoplasm, unspecified: Secondary | ICD-10-CM | POA: Insufficient documentation

## 2020-10-02 DIAGNOSIS — Z780 Asymptomatic menopausal state: Secondary | ICD-10-CM

## 2020-10-02 DIAGNOSIS — C773 Secondary and unspecified malignant neoplasm of axilla and upper limb lymph nodes: Secondary | ICD-10-CM | POA: Diagnosis not present

## 2020-10-02 MED ORDER — ANASTROZOLE 1 MG PO TABS: 1.0000 mg | ORAL_TABLET | Freq: Every day | ORAL | 3 refills | Status: DC

## 2020-10-02 NOTE — Assessment & Plan Note (Signed)
04/14/2017 Left axillary lymph node biopsy: Metastatic carcinoma positive for CK 7, ER positive,GATA-3 equivocal, negative for CK 20,GCDFP, CDX-2, TTF-1; HER-2 positive ratio 2.08 mammogram and ultrasound revealed 3 discrete abnormal left axillary lymph nodes largest measures 1.8 cm Clinical stage: TX N1MX  Treatment plan: 1. Neoadjuvant TCH Perjeta 6 cycles completed 08/31/2017 followed by Herceptin, Perjeta maintenance for 1 year 2. followed by axillary lymph node dissection. Breast surgery will not be performed because there is no primary tumor identifiable on breast MRI: 10/14/17: 0/13 LN Negative 3. Followed by radiation 12/08/2017-01/12/2018 4. Followed by antiestrogen therapy started 01/30/2018 5.Herceptin Perjeta maintenance completed 05/10/2018 6.Neratinib started 6/7/2019discontinued 04/03/2019 ------------------------------------------------------------------------------------------------------------------ Current treatment: Anastrozole  Anastrozole toxicities: Tolerating it reasonably well.  She does have some joint stiffness in the morning but she gets better when she moves. Occasional hot flashes but they are much better than before.  Breast cancer surveillance: 1.  Breast exam 10/02/2020: Benign 2. Mammograms done May 2021: Benign 3.  Breast MRI November 2020: Benign: This being done every other year.  Her daughter now works in the [facility] [facility] infusion center. Return to clinic in 1 year for follow-up.

## 2020-10-03 ENCOUNTER — Ambulatory Visit: Payer: 59 | Admitting: Hematology and Oncology

## 2020-12-30 ENCOUNTER — Other Ambulatory Visit: Payer: Self-pay

## 2020-12-30 ENCOUNTER — Ambulatory Visit
Admission: RE | Admit: 2020-12-30 | Discharge: 2020-12-30 | Disposition: A | Discharge status: Home / Self Care | Payer: 59 | Source: Ambulatory Visit | Attending: Hematology and Oncology | Admitting: Hematology and Oncology

## 2020-12-30 DIAGNOSIS — Z780 Asymptomatic menopausal state: Secondary | ICD-10-CM

## 2021-01-13 ENCOUNTER — Telehealth: Payer: Self-pay | Admitting: *Deleted

## 2021-01-13 NOTE — Telephone Encounter (Signed)
This RN called and spoke with the patient per results and follow up on recent bone density - noted areas of concern mentioned by tech do correspond with surgery for hysterectomy with clip placement.  Arleatha verbalized appreciation - she states it concerned her due to having her previous bone density by the GYN surgeon who performed the study and " this was never asked ( regarding hardware). She also thought of "hardware" as more like hip replacement not surgical clips ( which she was unaware of as well )   No further needs per pt's concerns at this time.

## 2021-03-24 ENCOUNTER — Other Ambulatory Visit: Payer: Self-pay | Admitting: Hematology and Oncology

## 2021-03-24 DIAGNOSIS — Z853 Personal history of malignant neoplasm of breast: Secondary | ICD-10-CM

## 2021-04-10 ENCOUNTER — Telehealth: Payer: Self-pay | Admitting: *Deleted

## 2021-04-10 ENCOUNTER — Other Ambulatory Visit: Payer: Self-pay | Admitting: *Deleted

## 2021-04-10 NOTE — Telephone Encounter (Signed)
Spoke with patient. She has changed insurance to [facility]. Normally, she got her compression sleeve from [facility] [place]. She was told by [facility] that she had to use [facility] for DME. She placed an order, [facility] submitted it to [facility] and was told it is not covered by [facility]. Out of pocket cost is $184.36. Jennye Boroughs suggested she call [facility] and ask for assistance from a Education officer, museum.

## 2021-05-01 ENCOUNTER — Encounter: Payer: Self-pay | Admitting: Hematology and Oncology

## 2021-05-01 ENCOUNTER — Other Ambulatory Visit: Payer: Self-pay

## 2021-05-01 ENCOUNTER — Ambulatory Visit
Admission: RE | Admit: 2021-05-01 | Discharge: 2021-05-01 | Disposition: A | Discharge status: Home / Self Care | Payer: 59 | Source: Ambulatory Visit | Attending: Hematology and Oncology | Admitting: Hematology and Oncology

## 2021-05-01 ENCOUNTER — Other Ambulatory Visit: Payer: Self-pay | Admitting: Hematology and Oncology

## 2021-05-01 DIAGNOSIS — Z853 Personal history of malignant neoplasm of breast: Secondary | ICD-10-CM

## 2021-05-07 ENCOUNTER — Other Ambulatory Visit: Payer: Self-pay | Admitting: Internal Medicine

## 2021-05-07 DIAGNOSIS — E7800 Pure hypercholesterolemia, unspecified: Secondary | ICD-10-CM

## 2021-06-02 ENCOUNTER — Ambulatory Visit
Admission: RE | Admit: 2021-06-02 | Discharge: 2021-06-02 | Disposition: A | Discharge status: Home / Self Care | Payer: 59 | Source: Ambulatory Visit | Attending: Internal Medicine | Admitting: Internal Medicine

## 2021-06-02 ENCOUNTER — Encounter: Payer: Self-pay | Admitting: Hematology and Oncology

## 2021-06-02 DIAGNOSIS — E7800 Pure hypercholesterolemia, unspecified: Secondary | ICD-10-CM

## 2021-09-17 ENCOUNTER — Other Ambulatory Visit: Payer: Self-pay | Admitting: Hematology and Oncology

## 2021-09-18 ENCOUNTER — Encounter: Payer: Self-pay | Admitting: Hematology and Oncology

## 2021-10-02 ENCOUNTER — Ambulatory Visit: Payer: 59 | Admitting: Hematology and Oncology

## 2021-10-07 ENCOUNTER — Ambulatory Visit: Payer: No Typology Code available for payment source | Admitting: Hematology and Oncology

## 2021-10-09 ENCOUNTER — Ambulatory Visit: Payer: No Typology Code available for payment source | Admitting: Hematology and Oncology

## 2021-10-13 NOTE — Progress Notes (Signed)
Patient Care Team: Deland Pretty, MD as PCP - General (Internal Medicine)  DIAGNOSIS:    ICD-10-CM   1. 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.3       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[place])  04/14/2017 Initial Diagnosis   Left axillary lymph node biopsy: Metastatic carcinoma positive for CK 7, ER positive,GATA-3 equivocal, negative for CK 20,GCDFP, CDX-2, TTF-1; HER-2 positive ratio 2.08; mammogram and ultrasound revealed 3 discrete abnormal left axillary lymph nodes largest measures 1.8 cm   04/23/2017 Breast MRI   Left axillary lymphadenopathy numerous enlarged level I axillary lymph nodes largest 2.1 cm, no other abdominal masses in the left breast itself, no MRI evidence of malignancy in the right breast   04/26/2017 Imaging   CT CAP: Prominent left axillary lymph nodes no distant metastases, sclerotic focus left pedicle of T9 vertebra favored benign, T5 vertebral hemangioma   05/12/2017 - 08/31/2017 Neo-Adjuvant Chemotherapy   TCH Perjeta x6 followed by Herceptin Perjeta maintenance for 1 year   10/14/2017 Surgery   Left axillary lymph node dissection: 0/13 lymph nodes complete pathologic response.  Breast was not operated on because no breast primary was ever identified   12/09/2017 - 01/11/2018 Radiation Therapy   Adjuvant radiation therapy   01/30/2018 -  Anti-estrogen oral therapy   Anastrozole 1 mg daily, Neratinib started 05/13/2018   09/09/2018 Genetic Testing   The MyRisk gene panel offered by [employer] includes sequencing and deletion/duplication testing of the following 35 genes: APC, ATM, BARD1, BMPR1A, BRCA1, BRCA2, BRIP1, CHD1, CDK4, CDKN2A, CHEK2, EPCAM (large rearrangement only), GREM1, HOXB13, AXIN2, MSH3, NTHL1, RNF43, GALNT12, RSP20, MLH1, MSH2, MSH6, MUTYH, NBN, PALB2, PMS2, PTEN, RAD51C, RAD51D, SMAD4,  STK11, and TP53. Sequencing was performed for select regions of POLE and POLD1, and large rearrangement analysis was performed for select regions of GREM1.  Results: Negative, no pathogenic variants identified.  The date of this test report is 09/09/2018.     CHIEF COMPLIANT: Follow-up of breast cancer on anastrozole   INTERVAL HISTORY: Melissa [name] is a 60 y.o. with above-mentioned history of breast cancer treated with neoadjuvant chemotherapy, left axillary lymph node dissection, radiation, adjuvant Herceptin Perjeta maintenance, neratinib, and is currently on antiestrogen therapy with anastrozole. Mammogram on 05/01/2021 showed no evidence of malignancy bilaterally. She presents to the clinic today for follow-up.   ALLERGIES:  has No Known Allergies.  MEDICATIONS:  Current Outpatient Medications  Medication Sig Dispense Refill   anastrozole (ARIMIDEX) 1 MG tablet TAKE ONE TABLET BY MOUTH DAILY 90 tablet 3   calcium carbonate (OS-CAL) 1250 (500 Ca) MG chewable tablet Chew 1 tablet by mouth daily. Calcium 600 mg     cholecalciferol (VITAMIN D) 1000 units tablet Take 1,000 Units by mouth daily. 2000 iu     rosuvastatin (CRESTOR) 10 MG tablet Take 10 mg by mouth daily.   0   triamterene-hydrochlorothiazide (MAXZIDE-25) 37.5-25 MG tablet Take 1 tablet by mouth daily.     zinc gluconate 50 MG tablet Take 1 tablet (50 mg total) by mouth daily.     zolpidem (AMBIEN) 5 MG tablet Take 1 tablet (5 mg total) by mouth at bedtime as needed. for sleep 90 tablet 1   No current facility-administered medications for this visit.    PHYSICAL EXAMINATION: ECOG PERFORMANCE STATUS: 1 - Symptomatic but completely ambulatory  There were no vitals filed  for this visit. There were no vitals filed for this visit.  BREAST: No palpable masses or nodules in either right or left breasts. No palpable axillary supraclavicular or infraclavicular adenopathy no breast tenderness or nipple discharge. (exam performed  in the presence of a chaperone)  LABORATORY DATA:  I have reviewed the data as listed CMP Latest Ref Rng & Units 03/01/2019 01/30/2019 12/01/2018  Glucose 70 - 99 mg/dL 87 96 93  BUN 6 - 20 mg/dL 22(H) 20 17  Creatinine 0.44 - 1.00 mg/dL 0.85 1.04(H) 1.00  Sodium 135 - 145 mmol/L 139 140 141  Potassium 3.5 - 5.1 mmol/L 3.5 4.0 4.1  Chloride 98 - 111 mmol/L 98 101 105  CO2 22 - 32 mmol/L $RemoveB'30 26 28  'gDGEvyfU$ Calcium 8.9 - 10.3 mg/dL 9.6 9.9 9.8  Total Protein 6.5 - 8.1 g/dL 7.6 7.7 6.9  Total Bilirubin 0.3 - 1.2 mg/dL 0.4 0.5 0.3  Alkaline Phos 38 - 126 U/L 89 93 97  AST 15 - 41 U/L $Remo'15 21 19  'DmECf$ ALT 0 - 44 U/L $Remo'17 16 16    'dZYxi$ Lab Results  Component Value Date   WBC 6.1 03/01/2019   HGB 14.6 03/01/2019   HCT 43.7 03/01/2019   MCV 90.7 03/01/2019   PLT 352 03/01/2019   NEUTROABS 2.9 03/01/2019    ASSESSMENT & PLAN:  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[place]) 04/14/2017 Left axillary lymph node biopsy: Metastatic carcinoma positive for CK 7, ER positive,GATA-3 equivocal, negative for CK 20,GCDFP, CDX-2, TTF-1; HER-2 positive ratio 2.08 mammogram and ultrasound revealed 3 discrete abnormal left axillary lymph nodes largest measures 1.8 cm Clinical stage: TX N1 MX   Treatment plan: 1. Neoadjuvant TCH Perjeta 6 cycles completed 08/31/2017 followed by Herceptin, Perjeta maintenance for 1 year 2. followed by axillary lymph node dissection. Breast surgery will not be performed because there is no primary tumor identifiable on breast MRI: 10/14/17: 0/13 LN Negative 3. Followed by radiation 12/08/2017-01/12/2018 4. Followed by antiestrogen therapy started 01/30/2018 5. Herceptin Perjeta maintenance completed 05/10/2018 6. Neratinib started 05/13/2018 discontinued 04/03/2019 ------------------------------------------------------------------------------------------------------------------ Current treatment: Anastrozole  Anastrozole toxicities: Tolerating it reasonably  well.  She does have some joint stiffness in the morning but she gets better when she moves. Occasional hot flashes but they are much better than before.   Breast cancer surveillance: 1.  Breast exam 10/15/2021: Benign, patient felt swelling in the left axilla which on palpation was underneath the scar and felt to be a scar tissue. 2. Mammograms 05/03/2021: Benign, density category C 3.  Breast MRI November 2020: Benign: We discussed the pros and cons of obtaining a breast MRI.  Because of her insurance not being of good coverage, she felt that if the mammograms are good she may not proceed with breast MRI at this time.  She will check with her insurance about her out-of-pocket cost and will inform us of her decision.   Her daughter Melissa [name] now works in the [place] center. Return to clinic in 1 year for follow-up.    No orders of the defined types were placed in this encounter.  The patient has a good understanding of the overall plan. she agrees with it. she will call with any problems that may develop before the next visit here.  Total time spent: 20 mins including face to face time and time spent for planning, charting and coordination of care  Melissa Eisenmenger, MD, MPH 10/14/2021  I, Thana Ates, am acting as  scribe for Dr. Rooney Swails.  I have reviewed the above documentation for accuracy and completeness, and I agree with the above.       

## 2021-10-14 ENCOUNTER — Other Ambulatory Visit: Payer: Self-pay

## 2021-10-14 ENCOUNTER — Encounter: Payer: Self-pay | Admitting: Hematology and Oncology

## 2021-10-14 ENCOUNTER — Inpatient Hospital Stay: Payer: 59 | Attending: Hematology and Oncology | Admitting: Hematology and Oncology

## 2021-10-14 DIAGNOSIS — Z79811 Long term (current) use of aromatase inhibitors: Secondary | ICD-10-CM | POA: Insufficient documentation

## 2021-10-14 DIAGNOSIS — C50912 Malignant neoplasm of unspecified site of left female breast: Secondary | ICD-10-CM | POA: Diagnosis not present

## 2021-10-14 DIAGNOSIS — Z923 Personal history of irradiation: Secondary | ICD-10-CM | POA: Insufficient documentation

## 2021-10-14 DIAGNOSIS — Z9221 Personal history of antineoplastic chemotherapy: Secondary | ICD-10-CM | POA: Diagnosis not present

## 2021-10-14 DIAGNOSIS — Z79899 Other long term (current) drug therapy: Secondary | ICD-10-CM | POA: Diagnosis not present

## 2021-10-14 DIAGNOSIS — Z170 Estrogen receptor positive status [ER+]: Secondary | ICD-10-CM | POA: Diagnosis not present

## 2021-10-14 DIAGNOSIS — C773 Secondary and unspecified malignant neoplasm of axilla and upper limb lymph nodes: Secondary | ICD-10-CM | POA: Diagnosis not present

## 2021-10-14 NOTE — Assessment & Plan Note (Signed)
04/14/2017 Left axillary lymph node biopsy: Metastatic carcinoma positive for CK 7, ER positive,GATA-3 equivocal, negative for CK 20,GCDFP, CDX-2, TTF-1; HER-2 positive ratio 2.08 mammogram and ultrasound revealed 3 discrete abnormal left axillary lymph nodes largest measures 1.8 cm Clinical stage: TX N1MX  Treatment plan: 1. Neoadjuvant TCH Perjeta 6 cycles completed 08/31/2017 followed by Herceptin, Perjeta maintenance for 1 year 2. followed by axillary lymph node dissection. Breast surgery will not be performed because there is no primary tumor identifiable on breast MRI: 10/14/17: 0/13 LN Negative 3. Followed by radiation 12/08/2017-01/12/2018 4. Followed by antiestrogen therapy started 01/30/2018 5.Herceptin Perjeta maintenance completed 05/10/2018 6.Neratinib started 6/7/2019discontinued 04/03/2019 ------------------------------------------------------------------------------------------------------------------ Current treatment: Anastrozole Anastrozole toxicities: Tolerating it reasonably well.  She does have some joint stiffness in the morning but she gets better when she moves. Occasional hot flashes but they are much better than before.  Breast cancer surveillance: 1.  Breast exam 10/15/2021: Benign 2. Mammograms 05/03/2021: Benign, density category C 3.  Breast MRI November 2020: Benign: This being done every other year.  Her daughter now works in the [facility]. Return to clinic in 1 year for follow-up.

## 2022-01-19 DIAGNOSIS — J0100 Acute maxillary sinusitis, unspecified: Secondary | ICD-10-CM | POA: Diagnosis not present

## 2022-01-19 DIAGNOSIS — J45909 Unspecified asthma, uncomplicated: Secondary | ICD-10-CM | POA: Diagnosis not present

## 2022-01-19 DIAGNOSIS — F5101 Primary insomnia: Secondary | ICD-10-CM | POA: Diagnosis not present

## 2022-01-19 DIAGNOSIS — H6692 Otitis media, unspecified, left ear: Secondary | ICD-10-CM | POA: Diagnosis not present

## 2022-01-28 ENCOUNTER — Encounter: Payer: Self-pay | Admitting: Hematology and Oncology

## 2022-01-28 ENCOUNTER — Encounter: Payer: Self-pay | Admitting: *Deleted

## 2022-01-28 ENCOUNTER — Other Ambulatory Visit: Payer: Self-pay | Admitting: *Deleted

## 2022-01-28 DIAGNOSIS — C773 Secondary and unspecified malignant neoplasm of axilla and upper limb lymph nodes: Secondary | ICD-10-CM

## 2022-01-28 NOTE — Progress Notes (Signed)
Received call from pt stating she has checked with her insurance company and they do cover breast MRI's.  RN reviewed with MD and verbal orders received for pt to receive breast MRI.

## 2022-01-28 NOTE — Progress Notes (Signed)
Per pt request RN successfully faxed left arm compression sleeve order to a [facility] place [phone]).

## 2022-01-29 DIAGNOSIS — Z1211 Encounter for screening for malignant neoplasm of colon: Secondary | ICD-10-CM | POA: Diagnosis not present

## 2022-02-09 DIAGNOSIS — H6982 Other specified disorders of Eustachian tube, left ear: Secondary | ICD-10-CM | POA: Diagnosis not present

## 2022-02-11 DIAGNOSIS — L821 Other seborrheic keratosis: Secondary | ICD-10-CM | POA: Diagnosis not present

## 2022-02-11 DIAGNOSIS — L814 Other melanin hyperpigmentation: Secondary | ICD-10-CM | POA: Diagnosis not present

## 2022-02-11 DIAGNOSIS — L300 Nummular dermatitis: Secondary | ICD-10-CM | POA: Diagnosis not present

## 2022-02-11 DIAGNOSIS — C4441 Basal cell carcinoma of skin of scalp and neck: Secondary | ICD-10-CM | POA: Diagnosis not present

## 2022-02-15 ENCOUNTER — Encounter: Payer: Self-pay | Admitting: Hematology and Oncology

## 2022-02-18 ENCOUNTER — Encounter: Payer: Self-pay | Admitting: Hematology and Oncology

## 2022-02-22 ENCOUNTER — Other Ambulatory Visit: Payer: Self-pay

## 2022-02-22 ENCOUNTER — Ambulatory Visit
Admission: RE | Admit: 2022-02-22 | Discharge: 2022-02-22 | Disposition: A | Discharge status: Home / Self Care | Payer: BLUE CROSS/BLUE SHIELD | Source: Ambulatory Visit | Attending: Hematology and Oncology | Admitting: Hematology and Oncology

## 2022-02-22 DIAGNOSIS — C773 Secondary and unspecified malignant neoplasm of axilla and upper limb lymph nodes: Secondary | ICD-10-CM

## 2022-02-22 DIAGNOSIS — Z1239 Encounter for other screening for malignant neoplasm of breast: Secondary | ICD-10-CM | POA: Diagnosis not present

## 2022-02-22 MED ORDER — GADOBUTROL 1 MMOL/ML IV SOLN
6.0000 mL | Freq: Once | INTRAVENOUS | Status: AC | PRN
  Administered 2022-02-22: 6 mL via INTRAVENOUS

## 2022-02-23 ENCOUNTER — Telehealth: Payer: Self-pay

## 2022-02-23 ENCOUNTER — Other Ambulatory Visit: Payer: Self-pay

## 2022-02-23 DIAGNOSIS — C773 Secondary and unspecified malignant neoplasm of axilla and upper limb lymph nodes: Secondary | ICD-10-CM

## 2022-02-23 NOTE — Telephone Encounter (Signed)
Called patient regarding her MRI results; it was negative. Patient verbalized understanding and thanks.  ?

## 2022-03-23 DIAGNOSIS — C4442 Squamous cell carcinoma of skin of scalp and neck: Secondary | ICD-10-CM | POA: Diagnosis not present

## 2022-05-18 ENCOUNTER — Encounter: Payer: Self-pay | Admitting: Hematology and Oncology

## 2022-05-25 ENCOUNTER — Ambulatory Visit: Payer: No Typology Code available for payment source

## 2022-06-01 ENCOUNTER — Encounter: Payer: Self-pay | Admitting: Hematology and Oncology

## 2022-06-01 ENCOUNTER — Ambulatory Visit
Admission: RE | Admit: 2022-06-01 | Discharge: 2022-06-01 | Disposition: A | Discharge status: Home / Self Care | Payer: 59 | Source: Ambulatory Visit | Attending: Adult Health | Admitting: Adult Health

## 2022-06-01 DIAGNOSIS — C773 Secondary and unspecified malignant neoplasm of axilla and upper limb lymph nodes: Secondary | ICD-10-CM

## 2022-07-21 ENCOUNTER — Other Ambulatory Visit: Payer: Self-pay | Admitting: *Deleted

## 2022-07-21 ENCOUNTER — Encounter: Payer: Self-pay | Admitting: Hematology and Oncology

## 2022-07-21 DIAGNOSIS — C773 Secondary and unspecified malignant neoplasm of axilla and upper limb lymph nodes: Secondary | ICD-10-CM

## 2022-07-21 NOTE — Progress Notes (Signed)
Received message from pt with complaint of left arm tightness and pain.  Pt denies redness and warmth at this time. Pt states symptoms are similar to past experience with lymphedema.  Verbal orders received from MD for pt to be evaluated and treated by cancer rehab for left arm lymphedema.

## 2022-07-22 ENCOUNTER — Other Ambulatory Visit: Payer: Self-pay | Admitting: Hematology and Oncology

## 2022-07-29 ENCOUNTER — Other Ambulatory Visit: Payer: Self-pay

## 2022-07-29 ENCOUNTER — Ambulatory Visit: Payer: 59 | Attending: Hematology and Oncology | Admitting: Physical Therapy

## 2022-07-29 ENCOUNTER — Encounter: Payer: Self-pay | Admitting: Physical Therapy

## 2022-07-29 DIAGNOSIS — C773 Secondary and unspecified malignant neoplasm of axilla and upper limb lymph nodes: Secondary | ICD-10-CM | POA: Insufficient documentation

## 2022-07-29 DIAGNOSIS — M6281 Muscle weakness (generalized): Secondary | ICD-10-CM | POA: Diagnosis present

## 2022-07-29 DIAGNOSIS — M25612 Stiffness of left shoulder, not elsewhere classified: Secondary | ICD-10-CM | POA: Insufficient documentation

## 2022-07-29 DIAGNOSIS — R293 Abnormal posture: Secondary | ICD-10-CM | POA: Insufficient documentation

## 2022-07-29 DIAGNOSIS — M25512 Pain in left shoulder: Secondary | ICD-10-CM | POA: Insufficient documentation

## 2022-07-29 NOTE — Therapy (Signed)
OUTPATIENT PHYSICAL THERAPY ONCOLOGY EVALUATION  Patient Name: Melissa [name] MRN: [id number] DOB:October 13, 1961, 61 y.o., female Today's Date: 07/29/2022   PT End of Session - 07/29/22 1153     Visit Number 1    Number of Visits 9    Date for PT Re-Evaluation 08/26/22    PT Start Time 1104    PT Stop Time 1151    PT Time Calculation (min) 47 min    Activity Tolerance Patient tolerated treatment well    Behavior During Therapy [facility] for tasks assessed/performed             Past Medical History:  Diagnosis Date   Breast cancer ([place]) 04/2017   left breast   Cervical cancer ([place])    1990, treated with hysterectomy only   Family history of breast cancer    Family history of prostate cancer    GERD (gastroesophageal reflux disease)    History of radiation therapy 12/09/17- 01/12/18   Left Breast treated to 50 Gy with 25 fx of 2 Gy   Hypercholesteremia    Hypertension    Personal history of chemotherapy 2018   Left Breast Cancer   Personal history of radiation therapy 2018   Left Breast Cancer   Past Surgical History:  Procedure Laterality Date   ABDOMINAL HYSTERECTOMY  1990   AXILLARY LYMPH NODE DISSECTION Left 10/14/2017   Procedure: LEFT AXILLARY LYMPH NODE DISSECTION;  Surgeon: Rolm Bookbinder, MD;  Location: [facility];  Service: General;  Laterality: Left;   BREAST LUMPECTOMY Left    04/2017   EYE SURGERY     eye lift   PORTACATH PLACEMENT Right 05/12/2017   Procedure: INSERTION PORT-A-CATH WITH ULTRASOUND;  Surgeon: Rolm Bookbinder, MD;  Location: [facility];  Service: General;  Laterality: Right;   Patient Active Problem List   Diagnosis Date Noted   Genetic testing 09/16/2018   Family history of breast cancer    Family history of prostate cancer    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   Breast cancer, left ([place]) 10/14/2017   Port catheter in place 07/20/2017   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, estrogen receptor positive ([place]) 04/30/2017   Malignant (primary) neoplasm, unspecified ([place]) 04/26/2017   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[place]) 04/21/2017    PCP: Deland Pretty, MD  REFERRING PROVIDER: Nicholas Lose, MD  REFERRING DIAG: C77.3 (ICD-10-CM) - 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[place])   THERAPY DIAG:  Stiffness of left shoulder, not elsewhere classified  Acute pain of left shoulder  Muscle weakness (generalized)  Abnormal posture  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 I have very limited mobility. I have not been good at doing my exercises.I am taking ibuprofen a few times a day. I can raise my arm further if I help it with my other arm. I wear my sleeve at least 5 days a week. I am very good at wearing my sleeve when I am active.   PERTINENT HISTORY:   Pt completed neoadjuvant chemotherapy 05/12/17-08/31/17 followed by left ALND 0/13 nodes negative.  No breast surgery performed due to no breast primary every indentified.  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 Radiation also completed.  Currently on anti-estrogens.  PAIN:  Are you having pain? Yes NPRS scale: 1/10 Pain location: LUQ Pain orientation: Left  PAIN TYPE: aching and dull Pain description: constant  Aggravating factors: increased activity Relieving factors: rest though it worsens to a 7 at night  PRECAUTIONS: Other:  lymphedema LUE  WEIGHT BEARING RESTRICTIONS No  FALLS:  Has patient fallen in last 6 months? 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  LEISURE: pt does not exercise but is active with her 43 year old grandson  HAND DOMINANCE : right   PRIOR LEVEL OF FUNCTION: Independent  PATIENT GOALS less pain, more mobility   OBJECTIVE  COGNITION:  Overall cognitive status: Within functional limits for tasks assessed   PALPATION: Increased tenderness in area of serratus, upper traps and external rotators  OBSERVATIONS / OTHER ASSESSMENTS: some crepitus noted during L shoulder flexion and abduction  POSTURE: forward head, rounded shoulders  UPPER EXTREMITY AROM/PROM:  A/PROM RIGHT   eval   Shoulder extension 90  Shoulder flexion 162  Shoulder abduction 177  Shoulder internal rotation 67  Shoulder external rotation 90    (Blank rows = not tested)  A/PROM LEFT   eval  Shoulder extension 40 p!  Shoulder flexion 125  Shoulder abduction 115  Shoulder internal rotation 19  Shoulder external rotation 50    (Blank rows = not tested)    LYMPHEDEMA ASSESSMENTS:   SURGERY TYPE/DATE: L ALND   NUMBER OF LYMPH NODES REMOVED: 0/13  CHEMOTHERAPY: neoadjuvant completed 05/12/17-08/31/17  RADIATION:completed  HORMONE TREATMENT: currently taking anastrozole  INFECTIONS: none reported today  LYMPHEDEMA ASSESSMENTS:   LANDMARK RIGHT  eval  10 cm proximal to olecranon process 26.2  Olecranon process 23.9  10 cm proximal to ulnar styloid process 18.8  Just proximal to ulnar styloid process 14.5  Across hand at thumb web space 18  At base of 2nd digit 6  (Blank rows = not tested)  LANDMARK LEFT  eval  10 cm proximal to olecranon process 27.4  Olecranon process 23.8  10 cm proximal to ulnar styloid process 18.6  Just proximal to ulnar styloid process  14.5  Across hand at thumb web space 18  At base of 2nd digit 6  (Blank rows = not tested)   QUICK DASH SURVEY:   Katina Dung - 07/29/22 0001     Open a tight or new jar Moderate difficulty    Do heavy household chores (wash walls, wash floors) Moderate difficulty    Carry a shopping bag or briefcase Moderate difficulty    Wash your back Moderate difficulty    Use a knife to cut food Moderate difficulty    Recreational activities  in which you take some force or impact through your arm, shoulder, or hand (golf, hammering, tennis) Severe difficulty    During the past week, to what extent has your arm, shoulder or hand problem interfered with your normal social activities with family, friends, neighbors, or groups? Quite a bit    During the past week, to what extent has your arm, shoulder or hand problem limited your work or other regular daily activities Quite a bit    Arm, shoulder, or hand pain. Moderate    Tingling (pins and needles) in your arm, shoulder, or hand None    Difficulty Sleeping Moderate difficulty    DASH Score 52.27 %               TODAY'S TREATMENT  07/29/22- PROM to L shoulder in supine in direction of flexion, abduction and ER - stopped ER due to increased pain and crepitus, pt tolerated PROM well with not too much increase in pain  PATIENT EDUCATION:  Education details: importance of not guarding shoulder and using LUE during daily tasks Person educated: Patient Education method: Explanation Education comprehension: verbalized understanding   HOME EXERCISE PROGRAM: Continue to wear compression sleeve during day  ASSESSMENT:  CLINICAL IMPRESSION: Patient is a 61 y.o. female who was seen today for physical therapy evaluation and treatment for decrease L shoulder ROM and pain in L upper quadrant. Pt was previously seen at this clinic for LUE lymphedema but she has been managing this well through use of a compression sleeve that she wears 5 days a week.  She does not demonstrate any increased edema today in her LUE. Pt has very limited L shoulder ROM especially compared to her discharge measurements in 2020. She reports she guards her arm a lot and does not use it as much. She is very active with her grandson and has a granddaughter on the way and would like to improve her ROM so that she is able to help care for her granddaughter. Pt would benefit from skilled PT services to improve L shoulder ROM, decrease muscle pain in L upper quadrant and instruct pt in a home exercise program for continued stretching and strengthening.     OBJECTIVE IMPAIRMENTS decreased ROM, decreased strength, increased fascial restrictions, increased muscle spasms, impaired UE functional use, postural dysfunction, and pain.   ACTIVITY LIMITATIONS carrying, lifting, and reach over head  PARTICIPATION LIMITATIONS:  none  PERSONAL FACTORS Time since onset of injury/illness/exacerbation are also affecting patient's functional outcome.   REHAB POTENTIAL: Good  CLINICAL DECISION MAKING: Stable/uncomplicated  EVALUATION COMPLEXITY: Low  GOALS: Goals reviewed with patient? Yes  SHORT TERM GOALS=LONG TERM GOALS Target date: 08/26/2022    Pt will demonstrate 150 degrees of L shoulder flexion to allow her to reach in front of her. Baseline: Goal status: INITIAL  2.  Pt will demonstrate 150 degrees of L shoulder abduction to allow her to reach out to the side.  Baseline:  Goal status: INITIAL  3.  Pt will report a 75% improvement in pain and discomfort in L upper quadrant to allow improved comfort.  Baseline:  Goal status: INITIAL  4.  Pt will be independent in a home exercise program for long term stretching and strengthening.   Baseline:  Goal status: INITIAL   PLAN: PT FREQUENCY: 2x/week  PT DURATION: 4 weeks  PLANNED INTERVENTIONS: Therapeutic exercises, Therapeutic activity, Patient/Family education, Self Care, Joint mobilization, Manual lymph drainage,  and Manual therapy  PLAN FOR NEXT SESSION: pulleys, ball, PROM,  STM To LUQ   [employer], PT 07/29/2022, 12:07 PM

## 2022-08-05 ENCOUNTER — Ambulatory Visit: Payer: 59 | Admitting: Physical Therapy

## 2022-08-05 ENCOUNTER — Encounter: Payer: Self-pay | Admitting: Physical Therapy

## 2022-08-05 DIAGNOSIS — R293 Abnormal posture: Secondary | ICD-10-CM

## 2022-08-05 DIAGNOSIS — M6281 Muscle weakness (generalized): Secondary | ICD-10-CM

## 2022-08-05 DIAGNOSIS — C773 Secondary and unspecified malignant neoplasm of axilla and upper limb lymph nodes: Secondary | ICD-10-CM

## 2022-08-05 DIAGNOSIS — M25612 Stiffness of left shoulder, not elsewhere classified: Secondary | ICD-10-CM | POA: Diagnosis not present

## 2022-08-05 DIAGNOSIS — M25512 Pain in left shoulder: Secondary | ICD-10-CM

## 2022-08-05 NOTE — Therapy (Signed)
OUTPATIENT PHYSICAL THERAPY ONCOLOGY TREATMENT  Patient Name: Melissa [name] MRN: [id number] DOB:1961-01-10, 61 y.o., female Today's Date: 08/05/2022   PT End of Session - 08/05/22 1152     Visit Number 3    Number of Visits 9    Date for PT Re-Evaluation 08/26/22    PT Start Time 1106    PT Stop Time 1152    PT Time Calculation (min) 46 min    Activity Tolerance Patient tolerated treatment well    Behavior During Therapy [facility] for tasks assessed/performed             Past Medical History:  Diagnosis Date   Breast cancer ([place]) 04/2017   left breast   Cervical cancer ([place])    1990, treated with hysterectomy only   Family history of breast cancer    Family history of prostate cancer    GERD (gastroesophageal reflux disease)    History of radiation therapy 12/09/17- 01/12/18   Left Breast treated to 50 Gy with 25 fx of 2 Gy   Hypercholesteremia    Hypertension    Personal history of chemotherapy 2018   Left Breast Cancer   Personal history of radiation therapy 2018   Left Breast Cancer   Past Surgical History:  Procedure Laterality Date   ABDOMINAL HYSTERECTOMY  1990   AXILLARY LYMPH NODE DISSECTION Left 10/14/2017   Procedure: LEFT AXILLARY LYMPH NODE DISSECTION;  Surgeon: Rolm Bookbinder, MD;  Location: [place];  Service: General;  Laterality: Left;   BREAST LUMPECTOMY Left    04/2017   EYE SURGERY     eye lift   PORTACATH PLACEMENT Right 05/12/2017   Procedure: INSERTION PORT-A-CATH WITH ULTRASOUND;  Surgeon: Rolm Bookbinder, MD;  Location: [place];  Service: General;  Laterality: Right;   Patient Active Problem List   Diagnosis Date Noted   Genetic testing 09/16/2018   Family history of breast cancer    Family history of prostate cancer    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   PERTINENT HISTORY:   Pt completed neoadjuvant chemotherapy 05/12/17-08/31/17 followed by left ALND 0/13 nodes negative.  No breast surgery performed due to no breast primary every indentified.  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 Radiation also completed.  Currently on anti-estrogens.  PAIN:  Are you having pain? No  PRECAUTIONS: Other: lymphedema LUE  WEIGHT BEARING RESTRICTIONS No  FALLS:  Has patient fallen in last 6 months? 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  LEISURE: pt does not exercise but is active with her 67 year old grandson  HAND DOMINANCE : right   PRIOR LEVEL  OF FUNCTION: Independent  PATIENT GOALS less pain, more mobility   OBJECTIVE  COGNITION:  Overall cognitive status: Within functional limits for tasks assessed   PALPATION: Increased tenderness in area of serratus, upper traps and external rotators  OBSERVATIONS / OTHER ASSESSMENTS: some crepitus noted during L shoulder flexion and abduction  POSTURE: forward head, rounded shoulders  UPPER EXTREMITY AROM/PROM:  A/PROM RIGHT   eval   Shoulder extension 90  Shoulder flexion 162  Shoulder abduction 177  Shoulder internal rotation 67  Shoulder external rotation 90    (Blank rows = not tested)  A/PROM LEFT   eval LEFT 08/05/22  Shoulder extension 40 p! 43 no p!  Shoulder flexion 125 132  Shoulder abduction 115 95  Shoulder internal rotation 19   Shoulder external rotation 50     (Blank rows = not tested)    LYMPHEDEMA ASSESSMENTS:   SURGERY TYPE/DATE: L ALND   NUMBER OF LYMPH NODES REMOVED: 0/13  CHEMOTHERAPY: neoadjuvant completed 05/12/17-08/31/17  RADIATION:completed  HORMONE TREATMENT: currently taking anastrozole  INFECTIONS: none reported today  LYMPHEDEMA ASSESSMENTS:   LANDMARK RIGHT  eval  10 cm proximal to olecranon process 26.2  Olecranon process 23.9  10 cm proximal to ulnar styloid process 18.8  Just proximal to ulnar styloid process 14.5  Across hand at thumb web space 18  At base of 2nd digit 6  (Blank rows = not tested)  LANDMARK LEFT  eval  10 cm proximal to olecranon process 27.4  Olecranon process 23.8  10 cm proximal to ulnar styloid process 18.6  Just proximal to ulnar styloid process 14.5  Across hand at thumb web space 18  At base of 2nd digit 6  (Blank rows = not tested)   QUICK DASH SURVEY:       TODAY'S TREATMENT  08/05/22-  Pulleys x 2 min in direction of flexion and 2 min in direction of abduction while educating pt to pull with R arm and keep L arm passive to help prevent any pain Ball up wall x 10 reps in  direction of flexion with pt returning therapist demo and then 10 reps in direction of abduction on L only Attempted supine dowel exercises but pt had increased pain so stopped, instructed pt in shoulder isometrics with 5 sec holds x 5 reps each as follows: flexion, extension, ER, and IR with pt returning demonstrating and issued these as part of HEP PROM to L shoulder in direction of flexion and abduction while stabilizing scapula Scapular mobilization  in R sidelying to L scapula in to protraction and retraction STM in R sidelying to lateral edge of lats and serratus  07/29/22- PROM to L shoulder in supine in direction of flexion, abduction and ER - stopped ER due to increased pain and crepitus, pt tolerated PROM well with not too much increase in pain  PATIENT EDUCATION:  Education details: importance of not guarding shoulder and using LUE during daily tasks Person educated: Patient Education method: Explanation Education comprehension: verbalized understanding   HOME EXERCISE PROGRAM: Continue to wear compression sleeve during day  ASSESSMENT:  CLINICAL IMPRESSION: Pt reports her shoulder was feeling better after last session for two days but then a trip to the pool with grandkids which involved catching them as they jumped and cleaning out a storage building led to increased pain. Educated pt to try to avoid too much activity on that side to allow it to heal and strengthening. Issued L shoulder isometrics today and had pt demonstrate them. Gave as part of HEP. Pt has clunking in L shoulder but does not have pain associated with it.    OBJECTIVE IMPAIRMENTS decreased ROM, decreased strength, increased fascial restrictions, increased muscle spasms, impaired UE functional use, postural dysfunction, and pain.   ACTIVITY LIMITATIONS carrying, lifting, and reach over head  PARTICIPATION LIMITATIONS:  none  PERSONAL FACTORS Time since onset of injury/illness/exacerbation are also affecting  patient's functional outcome.   REHAB POTENTIAL: Good  CLINICAL DECISION MAKING: Stable/uncomplicated  EVALUATION COMPLEXITY: Low  GOALS: Goals reviewed with patient? Yes  SHORT TERM GOALS=LONG TERM GOALS Target date: 08/26/2022    Pt will demonstrate 150 degrees of L shoulder flexion to allow her to reach in front of her. Baseline: Goal status: INITIAL  2.  Pt will demonstrate 150 degrees of L shoulder abduction to allow her to reach out to the side.  Baseline:  Goal status: INITIAL  3.  Pt will report a 75% improvement in pain and discomfort in L upper quadrant to allow improved comfort.  Baseline:  Goal status: INITIAL  4.  Pt will be independent in a home exercise program for long term stretching and strengthening.   Baseline:  Goal status: INITIAL   PLAN: PT FREQUENCY: 2x/week  PT DURATION: 4 weeks  PLANNED INTERVENTIONS: Therapeutic exercises, Therapeutic activity, Patient/Family education, Self Care, Joint mobilization, Manual lymph drainage, and Manual therapy  PLAN FOR NEXT SESSION: pulleys, ball, PROM, STM To LUQ, how was pain after last session   [facility], PT 08/05/2022, 12:00 PM

## 2022-08-07 ENCOUNTER — Ambulatory Visit: Payer: 59 | Attending: Hematology and Oncology | Admitting: Physical Therapy

## 2022-08-07 ENCOUNTER — Encounter: Payer: Self-pay | Admitting: Physical Therapy

## 2022-08-07 DIAGNOSIS — M6281 Muscle weakness (generalized): Secondary | ICD-10-CM | POA: Diagnosis present

## 2022-08-07 DIAGNOSIS — M25512 Pain in left shoulder: Secondary | ICD-10-CM | POA: Diagnosis present

## 2022-08-07 DIAGNOSIS — R293 Abnormal posture: Secondary | ICD-10-CM | POA: Insufficient documentation

## 2022-08-07 DIAGNOSIS — C773 Secondary and unspecified malignant neoplasm of axilla and upper limb lymph nodes: Secondary | ICD-10-CM | POA: Diagnosis present

## 2022-08-07 DIAGNOSIS — M25612 Stiffness of left shoulder, not elsewhere classified: Secondary | ICD-10-CM | POA: Diagnosis present

## 2022-08-07 NOTE — Therapy (Signed)
OUTPATIENT PHYSICAL THERAPY ONCOLOGY TREATMENT  Patient Name: Melissa [name] MRN: [id number] DOB:1961-01-16, 61 y.o., female Today's Date: 08/07/2022   PT End of Session - 08/07/22 1008     Visit Number 4    Number of Visits 9    Date for PT Re-Evaluation 08/26/22    PT Start Time 0958    PT Stop Time 1057    PT Time Calculation (min) 59 min    Activity Tolerance Patient tolerated treatment well    Behavior During Therapy [facility] for tasks assessed/performed             Past Medical History:  Diagnosis Date   Breast cancer ([place]) 04/2017   left breast   Cervical cancer ([place])    1990, treated with hysterectomy only   Family history of breast cancer    Family history of prostate cancer    GERD (gastroesophageal reflux disease)    History of radiation therapy 12/09/17- 01/12/18   Left Breast treated to 50 Gy with 25 fx of 2 Gy   Hypercholesteremia    Hypertension    Personal history of chemotherapy 2018   Left Breast Cancer   Personal history of radiation therapy 2018   Left Breast Cancer   Past Surgical History:  Procedure Laterality Date   ABDOMINAL HYSTERECTOMY  1990   AXILLARY LYMPH NODE DISSECTION Left 10/14/2017   Procedure: LEFT AXILLARY LYMPH NODE DISSECTION;  Surgeon: Rolm Bookbinder, MD;  Location: [place];  Service: General;  Laterality: Left;   BREAST LUMPECTOMY Left    04/2017   EYE SURGERY     eye lift   PORTACATH PLACEMENT Right 05/12/2017   Procedure: INSERTION PORT-A-CATH WITH ULTRASOUND;  Surgeon: Rolm Bookbinder, MD;  Location: [place];  Service: General;  Laterality: Right;   Patient Active Problem List   Diagnosis Date Noted   Genetic testing 09/16/2018   Family history of breast cancer    Family history of prostate cancer    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   Breast cancer, left ([place]) 10/14/2017   Port catheter in place 07/20/2017   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, estrogen receptor positive ([place]) 04/30/2017   Malignant (primary) neoplasm, unspecified ([employer]) 04/26/2017   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 I haven't been able to do my exercises since last session due to watching my grandson.   PERTINENT HISTORY:   Pt completed neoadjuvant chemotherapy 05/12/17-08/31/17 followed by left ALND 0/13 nodes negative.  No breast surgery performed due to no breast primary every indentified.  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 Radiation also completed.  Currently on anti-estrogens.  PAIN:  Are you having pain? Yes 1/10, discomfort in L shoulder  PRECAUTIONS: Other: lymphedema LUE  WEIGHT BEARING RESTRICTIONS No  FALLS:  Has patient fallen in last 6 months? 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  LEISURE: pt does not exercise but is active with her 9 year old grandson  HAND DOMINANCE : right   PRIOR LEVEL OF FUNCTION: Independent  PATIENT GOALS less pain,  more mobility   OBJECTIVE  COGNITION:  Overall cognitive status: Within functional limits for tasks assessed   PALPATION: Increased tenderness in area of serratus, upper traps and external rotators  OBSERVATIONS / OTHER ASSESSMENTS: some crepitus noted during L shoulder flexion and abduction  POSTURE: forward head, rounded shoulders  UPPER EXTREMITY AROM/PROM:  A/PROM RIGHT   eval   Shoulder extension 90  Shoulder flexion 162  Shoulder abduction 177  Shoulder internal rotation 67  Shoulder external rotation 90    (Blank rows = not tested)  A/PROM LEFT   eval LEFT 08/05/22  Shoulder extension 40 p! 43 no p!  Shoulder flexion 125 132  Shoulder abduction 115 95  Shoulder internal rotation 19   Shoulder external rotation 50     (Blank rows = not tested)    LYMPHEDEMA ASSESSMENTS:   SURGERY TYPE/DATE: L ALND   NUMBER OF LYMPH NODES REMOVED: 0/13  CHEMOTHERAPY: neoadjuvant completed 05/12/17-08/31/17  RADIATION:completed  HORMONE TREATMENT: currently taking anastrozole  INFECTIONS: none reported today  LYMPHEDEMA ASSESSMENTS:   LANDMARK RIGHT  eval  10 cm proximal to olecranon process 26.2  Olecranon process 23.9  10 cm proximal to ulnar styloid process 18.8  Just proximal to ulnar styloid process 14.5  Across hand at thumb web space 18  At base of 2nd digit 6  (Blank rows = not tested)  LANDMARK LEFT  eval  10 cm proximal to olecranon process 27.4  Olecranon process 23.8  10 cm proximal to ulnar styloid process 18.6  Just proximal to ulnar styloid process 14.5  Across hand at thumb web space 18  At base of 2nd digit 6  (Blank rows = not tested)   QUICK DASH SURVEY:       TODAY'S TREATMENT  08/07/22-  Pulleys x 2 min in direction of flexion and 2 min in direction of abduction while educating pt to pull with R arm and keep L arm passive to help prevent any pain Ball up wall x 10 reps in direction of flexion with pt returning therapist demo unable  to do abduction due to pain Resisted walkouts with yellow band attached to doorknob in direction of flexion, extension, IR and ER with pt returning therapist demo x 10 reps each PROM to L shoulder in direction of flexion and abduction while stabilizing scapula but pt had increased pain today so stopped Gentle joint mobilizations in A-P and inferior direction grade 1 with no increase in pain STM in R sidelying to lateral edge of lats and serratus, upper traps and levator  where pt had increased pain using cocoa butter and pt felt much better afterwards   08/05/22-  Pulleys x 2 min in direction of flexion and 2 min in direction of abduction while educating pt to pull with R arm and keep L arm passive to help prevent any pain Ball up wall x 10 reps in direction of flexion with pt returning therapist demo and then 10 reps in direction of abduction on L only Attempted supine dowel exercises but pt had increased pain so stopped, instructed pt in shoulder isometrics with 5 sec holds x 5 reps each as follows: flexion, extension, ER, and IR with pt returning demonstrating and issued these as part of HEP PROM to L shoulder in direction of flexion and abduction while stabilizing scapula Scapular mobilization in R sidelying to L scapula in to protraction and retraction STM in R sidelying to lateral edge of lats and serratus  07/29/22- PROM to L shoulder in supine in direction of flexion, abduction and ER - stopped ER due to increased pain and crepitus, pt tolerated PROM well with not too much increase in pain  PATIENT EDUCATION:  Education details: importance of not guarding shoulder and using LUE during daily tasks Person educated: Patient Education method: Explanation Education comprehension: verbalized understanding   HOME EXERCISE PROGRAM: Continue to wear compression sleeve during day Shoulder isometrics in direction of flexion, extension, IR, ER Access Code: [facility] URL:  [email or web address] Date: 08/07/2022 Prepared by: Manus Gunning  Exercises - Shoulder External Rotation Reactive Isometrics  - 1 x daily - 7 x weekly - 1 sets - 10 reps - Shoulder Internal Rotation Reactive Isometrics (Mirrored)  - 1 x daily - 7 x weekly - 1 sets - 10 reps - Standing Shoulder Flexion Reactive Isometrics with Elbow Extended  - 1 x daily - 7 x weekly - 1 sets - 10 reps - Shoulder Extension Reactive Isometrics with Elbow Extended (Mirrored)  - 1 x daily - 7 x weekly - 1 sets - 10 reps  ASSESSMENT:  CLINICAL IMPRESSION: Instructed pt in resisted walkouts with yellow band today and pt was able to do these without increased pain. Issued these as part of HEP to do along with isometrics. Pt unable to tolerate much PROM due to pain and discomfort. Pt felt improvement after massage today to tight musculature - upper traps and serratus were very tight.    OBJECTIVE IMPAIRMENTS decreased ROM, decreased strength, increased fascial restrictions, increased muscle spasms, impaired UE functional use, postural dysfunction, and pain.   ACTIVITY LIMITATIONS carrying, lifting, and reach over head  PARTICIPATION LIMITATIONS:  none  PERSONAL FACTORS Time since onset of injury/illness/exacerbation are also affecting patient's functional outcome.   REHAB POTENTIAL: Good  CLINICAL DECISION MAKING: Stable/uncomplicated  EVALUATION COMPLEXITY: Low  GOALS: Goals reviewed with patient? Yes  SHORT TERM GOALS=LONG TERM GOALS Target date: 08/26/2022    Pt will demonstrate 150 degrees of L shoulder flexion to allow her to reach in front of her. Baseline: Goal status: INITIAL  2.  Pt will demonstrate 150 degrees of L shoulder abduction to allow her to reach out to the side.  Baseline:  Goal status: INITIAL  3.  Pt will report a 75% improvement in pain and discomfort in L upper quadrant to allow improved comfort.  Baseline:  Goal status: INITIAL  4.  Pt will be  independent in a home exercise program for long term stretching and strengthening.   Baseline:  Goal status: INITIAL   PLAN: PT FREQUENCY:  2x/week  PT DURATION: 4 weeks  PLANNED INTERVENTIONS: Therapeutic exercises, Therapeutic activity, Patient/Family education, Self Care, Joint mobilization, Manual lymph drainage, and Manual therapy  PLAN FOR NEXT SESSION: pulleys, ball, PROM, STM To LUQ, how was pain after last session, how are walkouts?   [employer], PT 08/07/2022, 11:04 AM

## 2022-08-11 ENCOUNTER — Encounter: Payer: Self-pay | Admitting: Rehabilitation

## 2022-08-11 ENCOUNTER — Ambulatory Visit: Payer: 59 | Admitting: Rehabilitation

## 2022-08-11 DIAGNOSIS — M25612 Stiffness of left shoulder, not elsewhere classified: Secondary | ICD-10-CM | POA: Diagnosis not present

## 2022-08-11 DIAGNOSIS — R293 Abnormal posture: Secondary | ICD-10-CM

## 2022-08-11 DIAGNOSIS — M25512 Pain in left shoulder: Secondary | ICD-10-CM

## 2022-08-11 DIAGNOSIS — M6281 Muscle weakness (generalized): Secondary | ICD-10-CM

## 2022-08-11 DIAGNOSIS — C773 Secondary and unspecified malignant neoplasm of axilla and upper limb lymph nodes: Secondary | ICD-10-CM

## 2022-08-11 NOTE — Therapy (Signed)
OUTPATIENT PHYSICAL THERAPY ONCOLOGY TREATMENT  Patient Name: Melissa [name] MRN: [id number] DOB:[birth year], 61 y.o., female Today's Date: 08/11/2022   PT End of Session - 08/11/22 1057     Visit Number 5    Number of Visits 9    Date for PT Re-Evaluation 08/26/22    PT Start Time 1100    PT Stop Time 1155    PT Time Calculation (min) 55 min    Activity Tolerance Patient tolerated treatment well    Behavior During Therapy [facility] for tasks assessed/performed             Past Medical History:  Diagnosis Date   Breast cancer ([place]) 04/2017   left breast   Cervical cancer ([place])    1990, treated with hysterectomy only   Family history of breast cancer    Family history of prostate cancer    GERD (gastroesophageal reflux disease)    History of radiation therapy 12/09/17- 01/12/18   Left Breast treated to 50 Gy with 25 fx of 2 Gy   Hypercholesteremia    Hypertension    Personal history of chemotherapy 2018   Left Breast Cancer   Personal history of radiation therapy 2018   Left Breast Cancer   Past Surgical History:  Procedure Laterality Date   ABDOMINAL HYSTERECTOMY  1990   AXILLARY LYMPH NODE DISSECTION Left 10/14/2017   Procedure: LEFT AXILLARY LYMPH NODE DISSECTION;  Surgeon: Rolm Bookbinder, MD;  Location: [place];  Service: General;  Laterality: Left;   BREAST LUMPECTOMY Left    04/2017   EYE SURGERY     eye lift   PORTACATH PLACEMENT Right 05/12/2017   Procedure: INSERTION PORT-A-CATH WITH ULTRASOUND;  Surgeon: Rolm Bookbinder, MD;  Location: [place];  Service: General;  Laterality: Right;   Patient Active Problem List   Diagnosis Date Noted   Genetic testing 09/16/2018   Family history of breast cancer    Family history of prostate cancer    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   Breast cancer, left ([place]) 10/14/2017   Port catheter in place 07/20/2017   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, estrogen receptor positive ([place]) 04/30/2017   Malignant (primary) neoplasm, unspecified ([place]) 04/26/2017   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[place]) 04/21/2017    PCP: Deland Pretty, MD  REFERRING PROVIDER: Nicholas Lose, MD  REFERRING DIAG: C77.3 (ICD-10-CM) - 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[place])   THERAPY DIAG:  Stiffness of left shoulder, not elsewhere classified  Acute pain of left shoulder  Muscle weakness (generalized)  Abnormal posture  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  No breast surgery performed due to no breast primary every indentified.  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 Radiation also completed.  Currently on anti-estrogens.   PAIN:  Are you having pain? Yes 1/10, discomfort in L shoulder  PRECAUTIONS: Other: lymphedema LUE  WEIGHT BEARING RESTRICTIONS No  FALLS:  Has patient fallen in last 6 months? 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  LEISURE: pt does not exercise but is active with her 42 year old grandson  HAND DOMINANCE : right   PRIOR LEVEL OF FUNCTION: Independent  PATIENT GOALS less pain, more mobility   OBJECTIVE  COGNITION:  Overall  cognitive status: Within functional limits for tasks assessed   PALPATION: Increased tenderness in area of serratus, upper traps and external rotators  OBSERVATIONS / OTHER ASSESSMENTS: some crepitus noted during L shoulder flexion and abduction  POSTURE: forward head, rounded shoulders  UPPER EXTREMITY AROM/PROM:  A/PROM RIGHT   eval   Shoulder extension 90  Shoulder flexion 162  Shoulder abduction 177  Shoulder internal rotation 67  Shoulder external rotation 90    (Blank rows = not tested)  A/PROM LEFT   eval LEFT 08/05/22  Shoulder extension 40 p! 43 no p!  Shoulder flexion 125 132  Shoulder abduction 115 95  Shoulder internal rotation 19   Shoulder external rotation 50     (Blank rows = not tested)    LYMPHEDEMA ASSESSMENTS:   SURGERY TYPE/DATE: L ALND   NUMBER OF LYMPH NODES REMOVED: 0/13  CHEMOTHERAPY: neoadjuvant completed 05/12/17-08/31/17  RADIATION:completed  HORMONE TREATMENT: currently taking anastrozole  INFECTIONS: none reported today  LYMPHEDEMA ASSESSMENTS:   LANDMARK RIGHT  eval  10 cm proximal to olecranon process 26.2  Olecranon process 23.9  10 cm proximal to ulnar styloid process 18.8  Just proximal to ulnar styloid process 14.5  Across hand at thumb web space 18  At base of 2nd digit 6  (Blank rows = not tested)  LANDMARK LEFT  eval  10 cm proximal to olecranon process 27.4  Olecranon process 23.8  10 cm proximal to ulnar styloid process 18.6  Just proximal to ulnar styloid process 14.5  Across hand at thumb web space 18  At base of 2nd digit 6  (Blank rows = not tested)   TODAY'S TREATMENT  08/11/22-  Pulleys x 2 min in direction of flexion and 2 min in direction of abduction  Ball up wall x 10 reps in direction of flexion Row bil yellow x 15 and then extension x 5 which was difficult (pt needed to sit due to feeling faint) Supine protraction/retraction x 10 with cueing  Supine c/cc circles x 10 Manual isometrics in  90deg PROM to L shoulder Gentle joint mobilizations in A-P and inferior direction grade 1 with no increase in pain STM in R sidelying to lateral edge of lats and serratus, upper traps and levator  08/07/22-  Pulleys x 2 min in direction of flexion and 2 min in direction of abduction while educating pt to pull with R arm and keep L arm passive to help prevent any pain Ball up wall x 10 reps in direction of flexion with pt returning therapist  demo unable to do abduction due to pain Resisted walkouts with yellow band attached to doorknob in direction of flexion, extension, IR and ER with pt returning therapist demo x 10 reps each PROM to L shoulder in direction of flexion and abduction while stabilizing scapula but pt had increased pain today so stopped Gentle joint mobilizations in A-P and inferior direction grade 1 with no increase in pain STM in R sidelying to lateral edge of lats and serratus, upper traps and levator where pt had increased pain using cocoa butter and pt felt much better afterwards  08/05/22-  Pulleys x 2 min in direction of flexion and 2 min in direction of abduction while educating pt to pull with R arm and keep L arm passive to help prevent any pain Ball up wall x 10 reps in direction of flexion with pt returning therapist demo and then 10 reps in direction of abduction on L only Attempted supine dowel exercises but pt had increased pain so stopped, instructed pt in shoulder isometrics with 5 sec holds x 5 reps each as follows: flexion, extension, ER, and IR with pt returning demonstrating and issued these as part of HEP PROM to L shoulder in direction of flexion and abduction while stabilizing scapula Scapular mobilization in R sidelying to L scapula in to protraction and retraction STM in R sidelying to lateral edge of lats and serratus  PATIENT EDUCATION:  Education details: importance of not guarding shoulder and using LUE during daily tasks Person educated:  Patient Education method: Explanation Education comprehension: verbalized understanding   HOME EXERCISE PROGRAM: Continue to wear compression sleeve during day Shoulder isometrics in direction of flexion, extension, IR, ER Access Code: [facility] URL: [email or web address] Date: 08/07/2022 Prepared by: Manus Gunning  Exercises - Shoulder External Rotation Reactive Isometrics  - 1 x daily - 7 x weekly - 1 sets - 10 reps - Shoulder Internal Rotation Reactive Isometrics (Mirrored)  - 1 x daily - 7 x weekly - 1 sets - 10 reps - Standing Shoulder Flexion Reactive Isometrics with Elbow Extended  - 1 x daily - 7 x weekly - 1 sets - 10 reps - Shoulder Extension Reactive Isometrics with Elbow Extended (Mirrored)  - 1 x daily - 7 x weekly - 1 sets - 10 reps  ASSESSMENT: CLINICAL IMPRESSION: Pt tolerated all well except felt a bit faint when thinking about pain that may occur with TE.  Better after sitting.  Noted trigger points and tenderness left LS, upper trap, and decreased mm mass in the posterior shoulder.    OBJECTIVE IMPAIRMENTS decreased ROM, decreased strength, increased fascial restrictions, increased muscle spasms, impaired UE functional use, postural dysfunction, and pain.   ACTIVITY LIMITATIONS carrying, lifting, and reach over head  PARTICIPATION LIMITATIONS:  none  PERSONAL FACTORS Time since onset of injury/illness/exacerbation are also affecting patient's functional outcome.   REHAB POTENTIAL: Good  CLINICAL DECISION MAKING: Stable/uncomplicated  EVALUATION COMPLEXITY: Low  GOALS: Goals reviewed with patient? Yes  SHORT TERM GOALS=LONG TERM GOALS Target date: 08/26/2022    Pt will demonstrate 150 degrees of L shoulder flexion to allow her to reach in front of her. Baseline: Goal status: INITIAL  2.  Pt will demonstrate 150 degrees of L shoulder abduction to allow her to reach out to the side.  Baseline:  Goal status: INITIAL  3.  Pt will  report a 75% improvement in pain and discomfort in L upper quadrant to allow improved comfort.  Baseline:  Goal status: INITIAL  4.  Pt will be independent in a home exercise program for long term stretching and strengthening.   Baseline:  Goal status: INITIAL   PLAN: PT FREQUENCY: 2x/week  PT DURATION: 4 weeks  PLANNED INTERVENTIONS: Therapeutic exercises, Therapeutic activity, Patient/Family education, Self Care, Joint mobilization, Manual lymph drainage, and Manual therapy  PLAN FOR NEXT SESSION: pulleys, ball, PROM, STM To LUQ, how was pain after last session, how are walkouts?   Stark Bray, PT 08/11/2022, 4:03 PM

## 2022-08-13 ENCOUNTER — Ambulatory Visit: Payer: 59

## 2022-08-13 DIAGNOSIS — C773 Secondary and unspecified malignant neoplasm of axilla and upper limb lymph nodes: Secondary | ICD-10-CM

## 2022-08-13 DIAGNOSIS — M25612 Stiffness of left shoulder, not elsewhere classified: Secondary | ICD-10-CM

## 2022-08-13 DIAGNOSIS — M25512 Pain in left shoulder: Secondary | ICD-10-CM

## 2022-08-13 DIAGNOSIS — R293 Abnormal posture: Secondary | ICD-10-CM

## 2022-08-13 DIAGNOSIS — M6281 Muscle weakness (generalized): Secondary | ICD-10-CM

## 2022-08-13 NOTE — Therapy (Signed)
OUTPATIENT PHYSICAL THERAPY ONCOLOGY TREATMENT  Patient Name: Melissa [name] MRN: [id number] DOB:07/14/1961, 61 y.o., female Today's Date: 08/13/2022   PT End of Session - 08/13/22 1221     Visit Number 6    Number of Visits 9    Date for PT Re-Evaluation 08/26/22    PT Start Time 1211    PT Stop Time 3428    PT Time Calculation (min) 62 min    Activity Tolerance Patient tolerated treatment well    Behavior During Therapy [facility] for tasks assessed/performed             Past Medical History:  Diagnosis Date   Breast cancer ([place]) 04/2017   left breast   Cervical cancer ([place])    1990, treated with hysterectomy only   Family history of breast cancer    Family history of prostate cancer    GERD (gastroesophageal reflux disease)    History of radiation therapy 12/09/17- 01/12/18   Left Breast treated to 50 Gy with 25 fx of 2 Gy   Hypercholesteremia    Hypertension    Personal history of chemotherapy 2018   Left Breast Cancer   Personal history of radiation therapy 2018   Left Breast Cancer   Past Surgical History:  Procedure Laterality Date   ABDOMINAL HYSTERECTOMY  1990   AXILLARY LYMPH NODE DISSECTION Left 10/14/2017   Procedure: LEFT AXILLARY LYMPH NODE DISSECTION;  Surgeon: Rolm Bookbinder, MD;  Location: [place];  Service: General;  Laterality: Left;   BREAST LUMPECTOMY Left    04/2017   EYE SURGERY     eye lift   PORTACATH PLACEMENT Right 05/12/2017   Procedure: INSERTION PORT-A-CATH WITH ULTRASOUND;  Surgeon: Rolm Bookbinder, MD;  Location: [place];  Service: General;  Laterality: Right;   Patient Active Problem List   Diagnosis Date Noted   Genetic testing 09/16/2018   Family history of breast cancer    Family history of prostate cancer    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   Breast cancer, left ([place]) 10/14/2017   Port catheter in place 07/20/2017   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, estrogen receptor positive ([place]) 04/30/2017   Malignant (primary) neoplasm, unspecified ([place]) 04/26/2017   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[place]) 04/21/2017    PCP: Deland Pretty, MD  REFERRING PROVIDER: Nicholas Lose, MD  REFERRING DIAG: C77.3 (ICD-10-CM) - 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[place])   THERAPY DIAG:  Stiffness of left shoulder, not elsewhere classified  Acute pain of left shoulder  Muscle weakness (generalized)  Abnormal posture  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 I can definitely tell an improvement since I've been coming.   PERTINENT HISTORY:   Pt completed neoadjuvant chemotherapy 05/12/17-08/31/17 followed by left ALND 0/13 nodes negative.  No breast surgery performed due to no breast primary every indentified.  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 Radiation also completed.  Currently on anti-estrogens.   PAIN:  Are you having pain? Yes 1/10, discomfort in L shoulder  PRECAUTIONS: Other: lymphedema LUE  WEIGHT BEARING RESTRICTIONS No  FALLS:  Has patient fallen in last 6 months? 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  LEISURE: pt does not exercise but is active with her 44 year old grandson  HAND DOMINANCE : right   PRIOR LEVEL OF FUNCTION: Independent  PATIENT GOALS less pain, more  mobility   OBJECTIVE  COGNITION:  Overall cognitive status: Within functional limits for tasks assessed   PALPATION: Increased tenderness in area of serratus, upper traps and external rotators  OBSERVATIONS / OTHER ASSESSMENTS: some crepitus noted during L shoulder flexion and abduction  POSTURE: forward head, rounded shoulders  UPPER EXTREMITY AROM/PROM:  A/PROM RIGHT   eval   Shoulder extension 90  Shoulder flexion 162  Shoulder abduction 177  Shoulder internal rotation 67  Shoulder external rotation 90    (Blank rows = not tested)  A/PROM LEFT   eval LEFT 08/05/22  Shoulder extension 40 p! 43 no p!  Shoulder flexion 125 132  Shoulder abduction 115 95  Shoulder internal rotation 19   Shoulder external rotation 50     (Blank rows = not tested)    LYMPHEDEMA ASSESSMENTS:   SURGERY TYPE/DATE: L ALND   NUMBER OF LYMPH NODES REMOVED: 0/13  CHEMOTHERAPY: neoadjuvant completed 05/12/17-08/31/17  RADIATION:completed  HORMONE TREATMENT: currently taking anastrozole  INFECTIONS: none reported today  LYMPHEDEMA ASSESSMENTS:   LANDMARK RIGHT  eval  10 cm proximal to olecranon process 26.2  Olecranon process 23.9  10 cm proximal to ulnar styloid process 18.8  Just proximal to ulnar styloid process 14.5  Across hand at thumb web space 18  At base of 2nd digit 6  (Blank rows = not tested)  LANDMARK LEFT  eval  10 cm proximal to olecranon process 27.4  Olecranon process 23.8  10 cm proximal to ulnar styloid process 18.6  Just proximal to ulnar styloid process 14.5  Across hand at thumb web space 18  At base of 2nd digit 6  (Blank rows = not tested)   TODAY'S TREATMENT  08/13/22- Therapeutic Exercises Pulleys x 2 min in direction of flexion and 2 min in direction of abduction  Ball up wall x 10 reps in direction of flexion, 1# on Lt wrist Forearms on wall for walking up wall x5 and then scapular retraction x5 each returning therapist demo Blue ball on wall  for up/down and side/side with Lt UE x30 sec  During exercises spent time answering pts questions about how her scapula being tight has affected her shoulder ROM and how we are going to work on improving her scapular mobility and strength which will help improve her posture and then that will help her Lt shoulder to feel better. Manual Therapy PROM to Lt shoulder into flexion, scaption, some abduction but this painful so stopped, and D2 all done within pts tolerance and scapular depression throughout to decr pinching  in shoulder Gentle joint mobilizations in A-P and inferior direction grade 1 with no increase in pain STM in Rt S/L to medial scapular border where pt palpably tight Scapular Mobs in Rt S/L into protraction and retraction working to improve scapular mobility, flexion of shoulder much improved with less pain after  08/11/22-  Pulleys x 2 min in direction of flexion and 2 min in direction of abduction  Ball up wall x 10 reps in direction of flexion Row bil yellow x 15 and then extension x 5 which was difficult (pt needed to sit due to feeling faint) Supine protraction/retraction x 10 with cueing  Supine c/cc circles x 10 Manual isometrics in 90deg PROM to L shoulder Gentle joint mobilizations in A-P and inferior direction grade 1 with no increase in pain STM in R sidelying to lateral edge of lats and serratus, upper traps and levator  08/07/22-  Pulleys x 2 min in direction of flexion and 2 min in direction of abduction while educating pt to pull with R arm and keep L arm passive to help prevent any pain Ball up wall x 10 reps in direction of flexion with pt returning therapist demo unable to do abduction due to pain Resisted walkouts with yellow band attached to doorknob in direction of flexion, extension, IR and ER with pt returning therapist demo x 10 reps each PROM to L shoulder in direction of flexion and abduction while stabilizing scapula but pt had increased pain today so  stopped Gentle joint mobilizations in A-P and inferior direction grade 1 with no increase in pain STM in R sidelying to lateral edge of lats and serratus, upper traps and levator where pt had increased pain using cocoa butter and pt felt much better afterwards   PATIENT EDUCATION:  Education details: importance of not guarding shoulder and using LUE during daily tasks Person educated: Patient Education method: Explanation Education comprehension: verbalized understanding   HOME EXERCISE PROGRAM: Continue to wear compression sleeve during day Shoulder isometrics in direction of flexion, extension, IR, ER Access Code: [facility] URL: [email or web address] Date: 08/07/2022 Prepared by: Manus Gunning  Exercises - Shoulder External Rotation Reactive Isometrics  - 1 x daily - 7 x weekly - 1 sets - 10 reps - Shoulder Internal Rotation Reactive Isometrics (Mirrored)  - 1 x daily - 7 x weekly - 1 sets - 10 reps - Standing Shoulder Flexion Reactive Isometrics with Elbow Extended  - 1 x daily - 7 x weekly - 1 sets - 10 reps - Shoulder Extension Reactive Isometrics with Elbow Extended (Mirrored)  - 1 x daily - 7 x weekly - 1 sets - 10 reps  ASSESSMENT: CLINICAL IMPRESSION: Pt did well today without reporting feeling lightheaded during any activity. She reports last time she thinks her fear of moving her arm got to her and caused her to feel lightheaded. She was able to tolerate more scapular strengthening exercises without increased pain but did have some popping in her shoulder. When encouraged to be mindful of decreasing Lt scapular compensations this seemed to improve. Continued with manual therapy and spent some time improving her scapular mobility. By end of session her Lt shoulder P/ROM into flexion was much improved with less pain. Overall pt feels she is improving and she will benefit from continuing per POC.   OBJECTIVE IMPAIRMENTS decreased ROM, decreased strength,  increased fascial restrictions, increased muscle spasms, impaired UE functional use, postural dysfunction, and pain.   ACTIVITY LIMITATIONS carrying, lifting, and reach over head  PARTICIPATION LIMITATIONS:  none  PERSONAL FACTORS Time since onset of injury/illness/exacerbation are also affecting patient's functional outcome.   REHAB POTENTIAL: Good  CLINICAL DECISION MAKING: Stable/uncomplicated  EVALUATION COMPLEXITY: Low  GOALS: Goals reviewed with patient? Yes  SHORT TERM GOALS=LONG TERM GOALS Target date: 08/26/2022    Pt will demonstrate 150 degrees of L shoulder flexion to allow her to reach in front of her. Baseline: Goal status: INITIAL  2.  Pt will demonstrate 150 degrees of L shoulder abduction to allow her to reach out to the side.  Baseline:  Goal status: INITIAL  3.  Pt will report a 75% improvement in pain and discomfort in L upper quadrant to allow improved comfort.  Baseline:  Goal status: INITIAL  4.  Pt will be independent in a home exercise program for long term stretching and strengthening.   Baseline:  Goal status: INITIAL   PLAN: PT FREQUENCY: 2x/week  PT DURATION: 4 weeks  PLANNED INTERVENTIONS: Therapeutic exercises, Therapeutic activity, Patient/Family education, Self Care, Joint mobilization, Manual lymph drainage, and Manual therapy  PLAN FOR NEXT SESSION: Remeasure A/ROM to assess progress; pulleys, ball, PROM, STM To LUQ, how was pain after last session, try having pt resume walkouts in clinic   [name], PTA 08/13/2022, 1:27 PM

## 2022-08-25 ENCOUNTER — Encounter: Payer: Self-pay | Admitting: Physical Therapy

## 2022-08-25 ENCOUNTER — Ambulatory Visit: Payer: 59 | Admitting: Physical Therapy

## 2022-08-25 DIAGNOSIS — M25612 Stiffness of left shoulder, not elsewhere classified: Secondary | ICD-10-CM | POA: Diagnosis not present

## 2022-08-25 DIAGNOSIS — C773 Secondary and unspecified malignant neoplasm of axilla and upper limb lymph nodes: Secondary | ICD-10-CM

## 2022-08-25 DIAGNOSIS — M25512 Pain in left shoulder: Secondary | ICD-10-CM

## 2022-08-25 DIAGNOSIS — M6281 Muscle weakness (generalized): Secondary | ICD-10-CM

## 2022-08-25 DIAGNOSIS — R293 Abnormal posture: Secondary | ICD-10-CM

## 2022-08-25 NOTE — Therapy (Signed)
OUTPATIENT PHYSICAL THERAPY ONCOLOGY TREATMENT  Patient Name: Melissa [name] MRN: [id number] DOB:August 02, 1961, 61 y.o., female Today's Date: 08/25/2022   PT End of Session - 08/25/22 1233     Visit Number 7    Number of Visits 9    Date for PT Re-Evaluation 08/26/22    PT Start Time 1205    PT Stop Time 4098    PT Time Calculation (min) 50 min    Activity Tolerance Patient tolerated treatment well    Behavior During Therapy [facility] for tasks assessed/performed             Past Medical History:  Diagnosis Date   Breast cancer ([place]) 04/2017   left breast   Cervical cancer ([place])    1990, treated with hysterectomy only   Family history of breast cancer    Family history of prostate cancer    GERD (gastroesophageal reflux disease)    History of radiation therapy 12/09/17- 01/12/18   Left Breast treated to 50 Gy with 25 fx of 2 Gy   Hypercholesteremia    Hypertension    Personal history of chemotherapy 2018   Left Breast Cancer   Personal history of radiation therapy 2018   Left Breast Cancer   Past Surgical History:  Procedure Laterality Date   ABDOMINAL HYSTERECTOMY  1990   AXILLARY LYMPH NODE DISSECTION Left 10/14/2017   Procedure: LEFT AXILLARY LYMPH NODE DISSECTION;  Surgeon: Rolm Bookbinder, MD;  Location: [place];  Service: General;  Laterality: Left;   BREAST LUMPECTOMY Left    04/2017   EYE SURGERY     eye lift   PORTACATH PLACEMENT Right 05/12/2017   Procedure: INSERTION PORT-A-CATH WITH ULTRASOUND;  Surgeon: Rolm Bookbinder, MD;  Location: [place];  Service: General;  Laterality: Right;   Patient Active Problem List   Diagnosis Date Noted   Genetic testing 09/16/2018   Family history of breast cancer    Family history of prostate cancer    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 I still have a lot of pain at night if I have over used it during the day.   PERTINENT HISTORY:   Pt completed neoadjuvant chemotherapy 05/12/17-08/31/17 followed by left ALND 0/13 nodes negative.  No breast surgery performed due to no breast primary every indentified.  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 Radiation also completed.  Currently on anti-estrogens.   PAIN:  Are you having pain? Yes 2/10, discomfort in L shoulder  PRECAUTIONS: Other: lymphedema LUE  WEIGHT BEARING RESTRICTIONS No  FALLS:  Has patient fallen in last 6 months? 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  LEISURE: pt does not exercise but is active with her 37 year old grandson  HAND DOMINANCE : right   PRIOR LEVEL OF FUNCTION: Independent  PATIENT GOALS  less pain, more mobility   OBJECTIVE  COGNITION:  Overall cognitive status: Within functional limits for tasks assessed   PALPATION: Increased tenderness in area of serratus, upper traps and external rotators  OBSERVATIONS / OTHER ASSESSMENTS: some crepitus noted during L shoulder flexion and abduction  POSTURE: forward head, rounded shoulders  UPPER EXTREMITY AROM/PROM:  A/PROM RIGHT   eval   Shoulder extension 90  Shoulder flexion 162  Shoulder abduction 177  Shoulder internal rotation 67  Shoulder external rotation 90    (Blank rows = not tested)  A/PROM LEFT   eval LEFT 08/05/22  Shoulder extension 40 p! 43 no p!  Shoulder flexion 125 132  Shoulder abduction 115 95  Shoulder internal rotation 19   Shoulder external rotation 50     (Blank rows = not tested)    LYMPHEDEMA ASSESSMENTS:   SURGERY TYPE/DATE: L ALND   NUMBER OF LYMPH NODES REMOVED: 0/13  CHEMOTHERAPY: neoadjuvant completed 05/12/17-08/31/17  RADIATION:completed  HORMONE TREATMENT: currently taking anastrozole  INFECTIONS: none reported today  LYMPHEDEMA ASSESSMENTS:   LANDMARK RIGHT  eval  10 cm proximal to olecranon process 26.2  Olecranon process 23.9  10 cm proximal to ulnar styloid process 18.8  Just proximal to ulnar styloid process 14.5  Across hand at thumb web space 18  At base of 2nd digit 6  (Blank rows = not tested)  LANDMARK LEFT  eval  10 cm proximal to olecranon process 27.4  Olecranon process 23.8  10 cm proximal to ulnar styloid process 18.6  Just proximal to ulnar styloid process 14.5  Across hand at thumb web space 18  At base of 2nd digit 6  (Blank rows = not tested)   TODAY'S TREATMENT  08/25/22- Therapeutic Exercises Pulleys x 2 min in direction of flexion and 2 min in direction of abduction  Ball up wall x 10 reps in direction of flexion, 1# on Lt wrist Forearms on wall for walking up wall x10 and then scapular retraction x10 each returning therapist  demo Resisted walk outs with yellow band in to flexion, extension, IR and ER x 10 reps each then scapular retraction with yellow band x 10 reps each Manual Therapy PROM to Lt shoulder into flexion but stopped due to pain and in to scaption and pt felt  a good stretch with this. Gentle joint mobilizations in A-P and inferior direction grade 1 with no increase in pain STM in Rt S/L to medial scapular border where pt palpably tight Scapular Mobs in Rt S/L  into protraction and retraction working to improve scapular mobility  08/13/22- Therapeutic Exercises Pulleys x 2 min in direction of flexion and 2 min in direction of abduction  Ball up wall x 10 reps in direction of flexion, 1# on Lt wrist Forearms on wall for walking up wall x5 and then scapular retraction x5 each returning therapist demo Blue ball on wall for up/down and side/side with Lt UE x30 sec  During exercises spent time answering pts questions about how her scapula being tight has affected her shoulder ROM and how we are going to work on improving her scapular mobility and strength which will help improve her posture and then that will help her Lt shoulder to feel better. Manual Therapy PROM to Lt shoulder into flexion, scaption, some abduction but this painful so stopped, and D2 all done within pts tolerance and scapular depression throughout to decr pinching in shoulder Gentle joint mobilizations in A-P and inferior direction grade 1 with no increase in pain STM in Rt S/L to medial scapular border where pt palpably tight Scapular Mobs in Rt S/L into protraction and retraction working to improve scapular mobility, flexion of shoulder much improved with less pain after  08/11/22-  Pulleys x 2 min in direction of flexion and 2 min in direction of abduction  Ball up wall x 10 reps in direction of flexion Row bil yellow x 15 and then extension x 5 which was difficult (pt needed to sit due to feeling faint) Supine protraction/retraction x  10 with cueing  Supine c/cc circles x 10 Manual isometrics in 90deg PROM to L shoulder Gentle joint mobilizations in A-P and inferior direction grade 1 with no increase in pain STM in R sidelying to lateral edge of lats and serratus, upper traps and levator  08/07/22-  Pulleys x 2 min in direction of flexion and 2 min in direction of abduction while educating pt to pull with R arm and keep L arm passive to help prevent any pain Ball up wall x 10 reps in direction of flexion with pt returning therapist demo unable to do abduction due to pain Resisted walkouts with yellow band attached to doorknob in direction of flexion, extension, IR and ER with pt returning therapist demo x 10 reps each PROM to L shoulder in direction of flexion and abduction while stabilizing scapula but pt had increased pain today so stopped Gentle joint mobilizations in A-P and inferior direction grade 1 with no increase in pain STM in R sidelying to lateral edge of lats and serratus, upper traps and levator where pt had increased pain using cocoa butter and pt felt much better afterwards   PATIENT EDUCATION:  Education details: importance of not guarding shoulder and using LUE during daily tasks Person educated: Patient Education method: Explanation Education comprehension: verbalized understanding   HOME EXERCISE PROGRAM: Continue to wear compression sleeve during day Shoulder isometrics in direction of flexion, extension, IR, ER Access Code: [facility] URL: [email or web address] Date: 08/07/2022 Prepared by: Manus Gunning  Exercises - Shoulder External Rotation Reactive Isometrics  - 1 x daily - 7 x weekly - 1 sets - 10 reps - Shoulder Internal Rotation Reactive Isometrics (Mirrored)  - 1 x daily - 7 x weekly - 1 sets - 10 reps - Standing Shoulder Flexion Reactive Isometrics with Elbow Extended  - 1 x daily - 7 x weekly - 1 sets - 10 reps - Shoulder Extension Reactive Isometrics with Elbow  Extended (Mirrored)  - 1 x daily -  7 x weekly - 1 sets - 10 reps  ASSESSMENT: CLINICAL IMPRESSION: Pt continues to demonstrate "crunching" sound with L shoulder flexion and abduction. She reports no pain with this. Instructed pt in band walk outs today and will add these to her HEP at next session. She is still having pain in her L shoulder especially at night. She does feel like her ROM is improving slowly. Will assess pt's progress towards goals at next session and update POC.   OBJECTIVE IMPAIRMENTS decreased ROM, decreased strength, increased fascial restrictions, increased muscle spasms, impaired UE functional use, postural dysfunction, and pain.   ACTIVITY LIMITATIONS carrying, lifting, and reach over head  PARTICIPATION LIMITATIONS:  none  PERSONAL FACTORS Time since onset of injury/illness/exacerbation are also affecting patient's functional outcome.   REHAB POTENTIAL: Good  CLINICAL DECISION MAKING: Stable/uncomplicated  EVALUATION COMPLEXITY: Low  GOALS: Goals reviewed with patient? Yes  SHORT TERM GOALS=LONG TERM GOALS Target date: 08/26/2022    Pt will demonstrate 150 degrees of L shoulder flexion to allow her to reach in front of her. Baseline: Goal status: INITIAL  2.  Pt will demonstrate 150 degrees of L shoulder abduction to allow her to reach out to the side.  Baseline:  Goal status: INITIAL  3.  Pt will report a 75% improvement in pain and discomfort in L upper quadrant to allow improved comfort.  Baseline:  Goal status: INITIAL  4.  Pt will be independent in a home exercise program for long term stretching and strengthening.   Baseline:  Goal status: INITIAL   PLAN: PT FREQUENCY: 2x/week  PT DURATION: 4 weeks  PLANNED INTERVENTIONS: Therapeutic exercises, Therapeutic activity, Patient/Family education, Self Care, Joint mobilization, Manual lymph drainage, and Manual therapy  PLAN FOR NEXT SESSION: Remeasure A/ROM to assess progress; pulleys, ball,  PROM, STM To LUQ, how was pain after last session, try having pt resume walkouts in clinic   [facility], PT 08/25/2022, 12:59 PM

## 2022-08-27 ENCOUNTER — Ambulatory Visit: Payer: 59 | Admitting: Physical Therapy

## 2022-08-27 ENCOUNTER — Encounter: Payer: Self-pay | Admitting: Physical Therapy

## 2022-08-27 DIAGNOSIS — M25612 Stiffness of left shoulder, not elsewhere classified: Secondary | ICD-10-CM | POA: Diagnosis not present

## 2022-08-27 DIAGNOSIS — M6281 Muscle weakness (generalized): Secondary | ICD-10-CM

## 2022-08-27 DIAGNOSIS — R293 Abnormal posture: Secondary | ICD-10-CM

## 2022-08-27 DIAGNOSIS — C773 Secondary and unspecified malignant neoplasm of axilla and upper limb lymph nodes: Secondary | ICD-10-CM

## 2022-08-27 DIAGNOSIS — M25512 Pain in left shoulder: Secondary | ICD-10-CM

## 2022-08-27 NOTE — Therapy (Addendum)
OUTPATIENT PHYSICAL THERAPY ONCOLOGY TREATMENT  Patient Name: Melissa [name] MRN: [id number] DOB:[birth year], 61 y.o., female Today's Date: 08/27/2022   PT End of Session - 08/27/22 1310     Visit Number 8    Number of Visits 16    Date for PT Re-Evaluation 09/24/22    PT Start Time 1301    PT Stop Time 1320    PT Time Calculation (min) 19 min    Activity Tolerance Patient tolerated treatment well    Behavior During Therapy [facility] for tasks assessed/performed              Past Medical History:  Diagnosis Date   Breast cancer ([place]) 04/2017   left breast   Cervical cancer ([place])    1990, treated with hysterectomy only   Family history of breast cancer    Family history of prostate cancer    GERD (gastroesophageal reflux disease)    History of radiation therapy 12/09/17- 01/12/18   Left Breast treated to 50 Gy with 25 fx of 2 Gy   Hypercholesteremia    Hypertension    Personal history of chemotherapy 2018   Left Breast Cancer   Personal history of radiation therapy 2018   Left Breast Cancer   Past Surgical History:  Procedure Laterality Date   ABDOMINAL HYSTERECTOMY  1990   AXILLARY LYMPH NODE DISSECTION Left 10/14/2017   Procedure: LEFT AXILLARY LYMPH NODE DISSECTION;  Surgeon: Rolm Bookbinder, MD;  Location: [place];  Service: General;  Laterality: Left;   BREAST LUMPECTOMY Left    04/2017   EYE SURGERY     eye lift   PORTACATH PLACEMENT Right 05/12/2017   Procedure: INSERTION PORT-A-CATH WITH ULTRASOUND;  Surgeon: Rolm Bookbinder, MD;  Location: [place];  Service: General;  Laterality: Right;   Patient Active Problem List   Diagnosis Date Noted   Genetic testing 09/16/2018   Family history of breast cancer    Family history of prostate cancer    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   Breast cancer, left ([place]) 10/14/2017   Port catheter in place 07/20/2017   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, estrogen receptor positive ([place]) 04/30/2017   Malignant (primary) neoplasm, unspecified ([place]) 04/26/2017   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[place]) 04/21/2017    PCP: Deland Pretty, MD  REFERRING PROVIDER: Nicholas Lose, MD  REFERRING DIAG: C77.3 (ICD-10-CM) - 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[place])   THERAPY DIAG:  Stiffness of left shoulder, not elsewhere classified  Acute pain of left shoulder  Muscle weakness (generalized)  Abnormal posture  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 I feel like my pain is not as frequent but I don't feel like my ROM has necessarily improved. I was fairly miserable every night and now it is just 1 or 2 nights a week. I have better self awareness of my posture.   PERTINENT HISTORY:   Pt completed neoadjuvant chemotherapy 05/12/17-08/31/17 followed by left ALND 0/13 nodes negative.  No breast surgery performed due to no breast primary every indentified.  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 Radiation also completed.  Currently on anti-estrogens.   PAIN:  Are you having pain? Yes 4/10, tightness in L shoulder  PRECAUTIONS: Other: lymphedema LUE  WEIGHT BEARING RESTRICTIONS No  FALLS:  Has patient fallen in last 6 months? 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  LEISURE: pt does  not exercise but is active with her 71 year old grandson  HAND DOMINANCE : right   PRIOR LEVEL OF FUNCTION: Independent  PATIENT GOALS less pain, more mobility   OBJECTIVE  COGNITION:  Overall cognitive status: Within functional limits for tasks assessed   PALPATION: Increased tenderness in area of serratus, upper traps and external rotators  OBSERVATIONS / OTHER ASSESSMENTS: some crepitus noted during L shoulder flexion and abduction  POSTURE: forward head, rounded shoulders  UPPER EXTREMITY AROM/PROM:  A/PROM RIGHT   eval   Shoulder extension 90  Shoulder flexion 162  Shoulder abduction 177  Shoulder internal rotation 67  Shoulder external rotation 90    (Blank rows = not tested)  A/PROM LEFT   eval LEFT 08/05/22 LEFT 08/27/22  Shoulder extension 40 p! 43 no p! 35  Shoulder flexion 125 132 115  Shoulder abduction 115 95 105 with crepitus  Shoulder internal rotation 19    Shoulder external rotation 50      (Blank rows = not tested)    LYMPHEDEMA ASSESSMENTS:   SURGERY TYPE/DATE: L ALND   NUMBER OF LYMPH NODES REMOVED: 0/13  CHEMOTHERAPY: neoadjuvant completed 05/12/17-08/31/17  RADIATION:completed  HORMONE TREATMENT: currently taking anastrozole  INFECTIONS: none reported today  LYMPHEDEMA ASSESSMENTS:   LANDMARK RIGHT  eval  10 cm proximal to olecranon process 26.2  Olecranon process 23.9  10 cm proximal to ulnar styloid process 18.8  Just proximal to ulnar styloid process 14.5  Across hand at thumb web space 18  At base of 2nd digit 6  (Blank rows = not tested)  LANDMARK LEFT  eval  10 cm proximal to olecranon process 27.4  Olecranon process 23.8  10 cm proximal to ulnar styloid process 18.6  Just proximal to ulnar styloid process 14.5  Across hand at thumb web space 18  At base of 2nd digit 6  (Blank rows = not tested)   TODAY'S TREATMENT  08/27/22-  Pulleys x 2 min in direction of flexion and 2 min in direction of abduction  Ball up  wall x 10 reps in direction of flexion Forearms on wall for scapular retraction stopped at 3 reps due to increased crepitus in the L shoulder  08/25/22- Therapeutic Exercises Pulleys x 2 min in direction of flexion and 2 min in direction of abduction  Ball up wall x 10 reps in direction of flexion, 1# on Lt wrist Forearms on wall for walking up wall x10 and then scapular retraction x10 each returning therapist demo  Resisted walk outs with yellow band in to flexion, extension, IR and ER x 10 reps each then scapular retraction with yellow band x 10 reps each Manual Therapy PROM to Lt shoulder into flexion but stopped due to pain and in to scaption and pt felt  a good stretch with this. Gentle joint mobilizations in A-P and inferior direction grade 1 with no increase in pain STM in Rt S/L to medial scapular border where pt palpably tight Scapular Mobs in Rt S/L into protraction and retraction working to improve scapular mobility  08/13/22- Therapeutic Exercises Pulleys x 2 min in direction of flexion and 2 min in direction of abduction  Ball up wall x 10 reps in direction of flexion, 1# on Lt wrist Forearms on wall for walking up wall x5 and then scapular retraction x5 each returning therapist demo Blue ball on wall for up/down and side/side with Lt UE x30 sec  During exercises spent time answering pts questions about how her scapula being tight has affected her shoulder ROM and how we are going to work on improving her scapular mobility and strength which will help improve her posture and then that will help her Lt shoulder to feel better. Manual Therapy PROM to Lt shoulder into flexion, scaption, some abduction but this painful so stopped, and D2 all done within pts tolerance and scapular depression throughout to decr pinching in shoulder Gentle joint mobilizations in A-P and inferior direction grade 1 with no increase in pain STM in Rt S/L to medial scapular border where pt palpably  tight Scapular Mobs in Rt S/L into protraction and retraction working to improve scapular mobility, flexion of shoulder much improved with less pain after  08/11/22-  Pulleys x 2 min in direction of flexion and 2 min in direction of abduction  Ball up wall x 10 reps in direction of flexion Row bil yellow x 15 and then extension x 5 which was difficult (pt needed to sit due to feeling faint) Supine protraction/retraction x 10 with cueing  Supine c/cc circles x 10 Manual isometrics in 90deg PROM to L shoulder Gentle joint mobilizations in A-P and inferior direction grade 1 with no increase in pain STM in R sidelying to lateral edge of lats and serratus, upper traps and levator  PATIENT EDUCATION:  Education details: importance of not guarding shoulder and using LUE during daily tasks Person educated: Patient Education method: Explanation Education comprehension: verbalized understanding   HOME EXERCISE PROGRAM: Continue to wear compression sleeve during day Shoulder isometrics in direction of flexion, extension, IR, ER Access Code: [facility] URL: [email or web address] Date: 08/07/2022 Prepared by: Manus Gunning  Exercises - Shoulder External Rotation Reactive Isometrics  - 1 x daily - 7 x weekly - 1 sets - 10 reps - Shoulder Internal Rotation Reactive Isometrics (Mirrored)  - 1 x daily - 7 x weekly - 1 sets - 10 reps - Standing Shoulder Flexion Reactive Isometrics with Elbow Extended  - 1 x daily - 7 x weekly - 1 sets - 10 reps - Shoulder Extension Reactive Isometrics with Elbow Extended (Mirrored)  - 1 x daily - 7 x weekly - 1 sets - 10 reps  ASSESSMENT: CLINICAL IMPRESSION: Pt reports that she has had increased soreness in her L shoulder since last session. Today when she was attempting to do scapular retraction there was increased crepitus. Pt reports the "noises" in her shoulder have worsened. Assessed pt's progress towards goals in therapy. Her flexion has  worsened slightly and her  abduction has improved but only slightly still with pain. Pt is having less frequent pain at night but pain is still persisting overall. Educated pt that at this point she should see an orthopedic doctor. She has an appointment scheduled next week. Will place pt on hold until she has seen her ortho dr and has been cleared to continue therapy. Will recert her POC since it is now past due with the hopes that pt will be able to return given her doctors approval.   OBJECTIVE IMPAIRMENTS decreased ROM, decreased strength, increased fascial restrictions, increased muscle spasms, impaired UE functional use, postural dysfunction, and pain.   ACTIVITY LIMITATIONS carrying, lifting, and reach over head  PARTICIPATION LIMITATIONS:  none  PERSONAL FACTORS Time since onset of injury/illness/exacerbation are also affecting patient's functional outcome.   REHAB POTENTIAL: Good  CLINICAL DECISION MAKING: Stable/uncomplicated  EVALUATION COMPLEXITY: Low  GOALS: Goals reviewed with patient? Yes  SHORT TERM GOALS=LONG TERM GOALS Target date: 08/26/2022    Pt will demonstrate 150 degrees of L shoulder flexion to allow her to reach in front of her. Baseline: Goal status: IN PROGRESS 08/27/22 115  2.  Pt will demonstrate 150 degrees of L shoulder abduction to allow her to reach out to the side.  Baseline:  Goal status: IN PROGRESS 08/27/22- 105  3.  Pt will report a 75% improvement in pain and discomfort in L upper quadrant to allow improved comfort.  Baseline:  Goal status: IN PROGRESS 08/27/22- 50% improvement  4.  Pt will be independent in a home exercise program for long term stretching and strengthening.   Baseline:  Goal status: IN PROGRESS   PLAN: PT FREQUENCY: 2x/week  PT DURATION: 4 weeks  PLANNED INTERVENTIONS: Therapeutic exercises, Therapeutic activity, Patient/Family education, Self Care, Joint mobilization, Manual lymph drainage, and Manual therapy  PLAN FOR  NEXT SESSION: what did doctor say about shoulder? Is she cleared to return to exercise?   Melissa [name], PT 08/27/2022, 1:37 PM  PHYSICAL THERAPY DISCHARGE SUMMARY  Visits from Start of Care: 8  Current functional level related to goals / functional outcomes: See above   Remaining deficits: Continued shoulder pain - recommended pt follow up with ortho dr    Education / Equipment: HEP   Patient agrees to discharge. Patient goals were not met. Patient is being discharged due to not returning since the last visit. After reviewing the chart it appears that pt went to the ortho dr and was planning on undergoing shoulder replacement surgery.  Melissa [name], [place] 01/27/23 9:57 AM

## 2022-08-31 NOTE — Progress Notes (Unsigned)
[place] Phone: [phone] Subjective:   Fontaine No, am serving as a scribe for Dr. Hulan Saas.  I'm seeing this patient by the request  of:  Deland Pretty, MD  CC: left shoulder pain   LPF:[id number]  Melissa [name] is a 61 y.o. female coming in with complaint of left shoulder pain, hx of breast cancer. Had 13 lymph nodes removed in 2018 and has never been 100%. Pain worsening in past 9 months. Patient has been doing rehab. Feels like pain and ROM is getting worse. Unable to sleep due to pain. Wears compression for lymphedema and latest measurement does not indicate swelling. Patient is in scapula and radiates into the arm pit. Therapist notes cracking with movement and would like evaluation before continuing more therapy. Using IBU.   Last mammogram negative 6/23      Past Medical History:  Diagnosis Date   Breast cancer ([place]) 04/2017   left breast   Cervical cancer ([place])    1990, treated with hysterectomy only   Family history of breast cancer    Family history of prostate cancer    GERD (gastroesophageal reflux disease)    History of radiation therapy 12/09/17- 01/12/18   Left Breast treated to 50 Gy with 25 fx of 2 Gy   Hypercholesteremia    Hypertension    Personal history of chemotherapy 2018   Left Breast Cancer   Personal history of radiation therapy 2018   Left Breast Cancer   Past Surgical History:  Procedure Laterality Date   ABDOMINAL HYSTERECTOMY  1990   AXILLARY LYMPH NODE DISSECTION Left 10/14/2017   Procedure: LEFT AXILLARY LYMPH NODE DISSECTION;  Surgeon: Rolm Bookbinder, MD;  Location: [facility];  Service: General;  Laterality: Left;   BREAST LUMPECTOMY Left    04/2017   EYE SURGERY     eye lift   PORTACATH PLACEMENT Right 05/12/2017   Procedure: INSERTION PORT-A-CATH WITH ULTRASOUND;  Surgeon: Rolm Bookbinder, MD;  Location: [facility];   Service: General;  Laterality: Right;   Social History   Socioeconomic History   Marital status: Married    Spouse name: Not on file   Number of children: Not on file   Years of education: Not on file   Highest education level: Not on file  Occupational History   Not on file  Tobacco Use   Smoking status: Never   Smokeless tobacco: Never  Vaping Use   Vaping Use: Never used  Substance and Sexual Activity   Alcohol use: Yes    Alcohol/week: 2.0 standard drinks of alcohol    Types: 2 Glasses of wine per week    Comment: socially   Drug use: No   Sexual activity: Not on file  Other Topics Concern   Not on file  Social History Narrative   Not on file   Social Determinants of Health   Financial Resource Strain: Not on file  Food Insecurity: Not on file  Transportation Needs: Not on file  Physical Activity: Not on file  Stress: Not on file  Social Connections: Not on file   No Known Allergies Family History  Problem Relation Age of Onset   Lung cancer Father 31   Prostate cancer Father    Cervical cancer Sister    Breast cancer Maternal Aunt        'postmenopausal'   Prostate cancer Maternal Grandfather  dx 60+   Mesothelioma Maternal Grandfather        aesbestos exposure     Current Outpatient Medications (Cardiovascular):    rosuvastatin (CRESTOR) 10 MG tablet, Take 10 mg by mouth daily.    triamterene-hydrochlorothiazide (MAXZIDE-25) 37.5-25 MG tablet, Take 1 tablet by mouth daily.     Current Outpatient Medications (Other):    anastrozole (ARIMIDEX) 1 MG tablet, TAKE ONE TABLET BY MOUTH DAILY   calcium carbonate (OS-CAL) 1250 (500 Ca) MG chewable tablet, Chew 1 tablet by mouth daily. Calcium 600 mg   cholecalciferol (VITAMIN D) 1000 units tablet, Take 1,000 Units by mouth daily. 2000 iu   zinc gluconate 50 MG tablet, Take 1 tablet (50 mg total) by mouth daily.   Reviewed prior external information including notes and imaging from  primary care  provider As well as notes that were available from care everywhere and other healthcare systems.  Past medical history, social, surgical and family history all reviewed in electronic medical record.  No pertanent information unless stated regarding to the chief complaint.   Review of Systems:  No headache, visual changes, nausea, vomiting, diarrhea, constipation, dizziness, abdominal pain, skin rash, fevers, chills, night sweats, weight loss, swollen lymph nodes, body aches, joint swelling, chest pain, shortness of breath, mood changes. POSITIVE muscle aches  Objective  Blood pressure 126/82, pulse 74, height '5\' 8"'$  (1.727 m), weight 148 lb (67.1 kg), SpO2 97 %.   General: No apparent distress alert and oriented x3 mood and affect normal, dressed appropriately.  HEENT: Pupils equal, extraocular movements intact  Respiratory: Patient's speak in full sentences and does not appear short of breath  Cardiovascular: No lower extremity edema, non tender, no erythema  Left shoulder exam shows the patient does have some atrophy of the shoulder noted.  Patient does have some crepitus noted with range of motion.  Only 10 degrees of external rotation noted.  Patient has some crepitus with range of motion.  3+ out of 5 strength of the rotator cuff compared to 5 out of 5 on the contralateral side.  Limited muscular skeletal ultrasound was performed and interpreted by Hulan Saas, M  Limited ultrasound shows the patient does have narrowing of the glenohumeral joint.  History Patient does have hypoechoic changes with slight appears to be patient does also have narrowing of the acromioclavicular joint.  Mild nature.  Patient supraspinatus does appear to have a chronic tear with possibly some mild noted deposits noted. Impression: Consistent with some rotator cuff pathology and underlying    Impression and Recommendations:     The above documentation has been reviewed and is accurate and complete Lyndal Pulley, DO

## 2022-09-01 ENCOUNTER — Encounter: Payer: No Typology Code available for payment source | Admitting: Physical Therapy

## 2022-09-01 ENCOUNTER — Encounter: Payer: Self-pay | Admitting: Family Medicine

## 2022-09-01 ENCOUNTER — Ambulatory Visit: Payer: Self-pay

## 2022-09-01 ENCOUNTER — Ambulatory Visit (INDEPENDENT_AMBULATORY_CARE_PROVIDER_SITE_OTHER): Payer: 59

## 2022-09-01 ENCOUNTER — Ambulatory Visit: Payer: 59 | Admitting: Family Medicine

## 2022-09-01 VITALS — BP 126/82 | HR 74 | Ht 68.0 in | Wt 148.0 lb

## 2022-09-01 DIAGNOSIS — M25512 Pain in left shoulder: Secondary | ICD-10-CM | POA: Insufficient documentation

## 2022-09-01 DIAGNOSIS — G8929 Other chronic pain: Secondary | ICD-10-CM | POA: Diagnosis not present

## 2022-09-01 NOTE — Assessment & Plan Note (Signed)
Patient has had left shoulder pain that seems to be worsening at this time. 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  X-rays are pending at the moment and I am concerned as the patient did have metastasis.  Patient has done relatively well but this is an acute significant change on the same side with patient to breast cancer.  Other concerns would be a rotator cuff arthropathy.  Due to the significant fairly severe, patient's past medical history, and patient's weakness I do think advanced imaging is warranted at this time.  We will get this approved and depending on the MRI we will discuss further treatment options.

## 2022-09-01 NOTE — Patient Instructions (Signed)
MRA L shoulder [phone] We will be in touch

## 2022-09-02 ENCOUNTER — Other Ambulatory Visit: Payer: Self-pay

## 2022-09-02 ENCOUNTER — Encounter: Payer: Self-pay | Admitting: Family Medicine

## 2022-09-02 DIAGNOSIS — M25512 Pain in left shoulder: Secondary | ICD-10-CM

## 2022-09-03 ENCOUNTER — Ambulatory Visit: Payer: 59 | Admitting: Physical Therapy

## 2022-09-08 ENCOUNTER — Ambulatory Visit (INDEPENDENT_AMBULATORY_CARE_PROVIDER_SITE_OTHER): Payer: 59 | Admitting: Sports Medicine

## 2022-09-08 ENCOUNTER — Ambulatory Visit (INDEPENDENT_AMBULATORY_CARE_PROVIDER_SITE_OTHER): Payer: 59

## 2022-09-08 DIAGNOSIS — M25512 Pain in left shoulder: Secondary | ICD-10-CM

## 2022-09-08 DIAGNOSIS — G8929 Other chronic pain: Secondary | ICD-10-CM

## 2022-09-08 NOTE — Assessment & Plan Note (Signed)
Injection performed for MR arthrography.

## 2022-09-08 NOTE — Progress Notes (Signed)
    Procedures performed today:    Procedure: Real-time Ultrasound Guided gadolinium contrast injection of left glenohumeral joint Device: Samsung HS60  Verbal informed consent obtained.  Time-out conducted.  Noted no overlying erythema, induration, or other signs of local infection.  Skin prepped in a sterile fashion.  Local anesthesia: Topical Ethyl chloride.  With sterile technique and under real time ultrasound guidance: Noted mild degenerative changes, 22-gauge spinal needle advanced into the glenohumeral joint from a posterior approach, I injected 1 cc kenalog 40, 2 cc lidocaine, 2 cc bupivacaine, syringe switched and 0.1 cc gadolinium injected, syringe again switched and 10 cc sterile saline used to fully distend the joint. Joint visualized and capsule seen distending confirming intra-articular placement of contrast material and medication. Completed without difficulty  Advised to call if fevers/chills, erythema, induration, drainage, or persistent bleeding.  Images permanently stored in PACS Impression: Technically successful ultrasound guided gadolinium contrast injection for MR arthrography.  Please see separate MR arthrogram report.  Independent interpretation of notes and tests performed by another provider:   None.  Brief History, Exam, Impression, and Recommendations:    Left shoulder pain Injection performed for MR arthrography.    ____________________________________________ Gwen Her. Dianah Field, M.D., ABFM., CAQSM., AME. Primary Care and Sports Medicine [facility]  Adjunct Professor of [employer]  Risk manager

## 2022-09-09 ENCOUNTER — Encounter: Payer: Self-pay | Admitting: Family Medicine

## 2022-09-14 NOTE — Progress Notes (Signed)
Melissa [name] Melissa [name] Phone: [phone] Subjective:   Melissa [name], am serving as a scribe for Dr. Hulan Saas.  I'm seeing this patient by the request  of:  Melissa Pretty, MD  CC: Left shoulder pain  [id number]  09/01/2022 Patient has had left shoulder pain that seems to be worsening at this time. 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  X-rays are pending at the moment and I am concerned as the patient did have metastasis.  Patient has done relatively well but this is an acute significant change on the same side with patient to breast cancer.  Other concerns would be a rotator cuff arthropathy.  Due to the significant fairly severe, patient's past medical history, and patient's weakness I do think advanced imaging is warranted at this time.  We will get this approved and depending on the MRI we will discuss further treatment options.  Updated 09/16/2022 Melissa [name] is a 61 y.o. female coming in with complaint of L shoulder pain. Pain has improved since getting MRI. Has not had any pain since MRI. Still has limited ROM.   MRI IMPRESSION: 1. Small partial-thickness tear of the articular side of the mid height of the subscapularis tendon insertion. [name] tendon retraction. The supraspinatus and infraspinatus tendons appear intact. Mild-to-moderate anterior supraspinatus muscle atrophy. 2. Mild degenerative changes of the acromioclavicular joint. 3. Mild subacromial/subdeltoid bursitis. 4. Severe glenohumeral osteoarthritis.     Past Medical History:  Diagnosis Date   Breast cancer ([place]) 04/2017   left breast   Cervical cancer ([place])    1990, treated with hysterectomy only   Family history of breast cancer    Family history of prostate cancer    GERD (gastroesophageal reflux disease)    History of radiation therapy 12/09/17- 01/12/18    Left Breast treated to 50 Gy with 25 fx of 2 Gy   Hypercholesteremia    Hypertension    Personal history of chemotherapy 2018   Left Breast Cancer   Personal history of radiation therapy 2018   Left Breast Cancer   Past Surgical History:  Procedure Laterality Date   ABDOMINAL HYSTERECTOMY  1990   AXILLARY LYMPH NODE DISSECTION Left 10/14/2017   Procedure: LEFT AXILLARY LYMPH NODE DISSECTION;  Surgeon: Rolm Bookbinder, MD;  Location: [facility];  Service: General;  Laterality: Left;   BREAST LUMPECTOMY Left    04/2017   EYE SURGERY     eye lift   PORTACATH PLACEMENT Right 05/12/2017   Procedure: INSERTION PORT-A-CATH WITH ULTRASOUND;  Surgeon: Rolm Bookbinder, MD;  Location: [facility];  Service: General;  Laterality: Right;   Social History   Socioeconomic History   Marital status: Married    Spouse name: Not on file   Number of children: Not on file   Years of education: Not on file   Highest education level: Not on file  Occupational History   Not on file  Tobacco Use   Smoking status: Never   Smokeless tobacco: Never  Vaping Use   Vaping Use: Never used  Substance and Sexual Activity   Alcohol use: Yes    Alcohol/week: 2.0 standard drinks of alcohol    Types: 2 Glasses of wine per week    Comment: socially   Drug use: [name]   Sexual activity: Not on file  Other Topics Concern   Not on file  Social History Narrative   Not on file   Social Determinants of Health   Financial Resource Strain: Not on file  Food Insecurity: Not on file  Transportation Needs: Not on file  Physical Activity: Not on file  Stress: Not on file  Social Connections: Not on file   [name] Known Allergies Family History  Problem Relation Age of Onset   Lung cancer Father 59   Prostate cancer Father    Cervical cancer Sister    Breast cancer Maternal Aunt        'postmenopausal'   Prostate cancer Maternal Grandfather        dx 60+   Mesothelioma Maternal  Grandfather        aesbestos exposure     Current Outpatient Medications (Cardiovascular):    rosuvastatin (CRESTOR) 10 MG tablet, Take 10 mg by mouth daily.    triamterene-hydrochlorothiazide (MAXZIDE-25) 37.5-25 MG tablet, Take 1 tablet by mouth daily.     Current Outpatient Medications (Other):    anastrozole (ARIMIDEX) 1 MG tablet, TAKE ONE TABLET BY MOUTH DAILY   calcium carbonate (OS-CAL) 1250 (500 Ca) MG chewable tablet, Chew 1 tablet by mouth daily. Calcium 600 mg   cholecalciferol (VITAMIN D) 1000 units tablet, Take 1,000 Units by mouth daily. 2000 iu   zinc gluconate 50 MG tablet, Take 1 tablet (50 mg total) by mouth daily.   Reviewed prior external information including notes and imaging from  primary care provider As well as notes that were available from care everywhere and other healthcare systems.  Past medical history, social, surgical and family history all reviewed in electronic medical record.  [name] pertanent information unless stated regarding to the chief complaint.   Review of Systems:  [name] headache, visual changes, nausea, vomiting, diarrhea, constipation, dizziness, abdominal pain, skin rash, fevers, chills, night sweats, weight loss, swollen lymph nodes, body aches, joint swelling, chest pain, shortness of breath, mood changes. POSITIVE muscle aches  Objective  Blood pressure 128/72, pulse 83, height '5\' 7"'$  (1.702 m), weight 148 lb (67.1 kg), SpO2 99 %.   General: [name] apparent distress alert and oriented x3 mood and affect normal, dressed appropriately.  HEENT: Pupils equal, extraocular movements intact  Respiratory: Patient's speak in full sentences and does not appear short of breath  Cardiovascular: [name] lower extremity edema, non tender, [name] erythema  Left shoulder does have some atrophy noted.  Crepitus noted in range of motion.  Still has weakness noted as well.    Impression and Recommendations:     The above documentation has been reviewed and is  accurate and complete Melissa Pulley, DO

## 2022-09-16 ENCOUNTER — Ambulatory Visit: Payer: Self-pay

## 2022-09-16 ENCOUNTER — Other Ambulatory Visit: Payer: No Typology Code available for payment source

## 2022-09-16 ENCOUNTER — Ambulatory Visit (INDEPENDENT_AMBULATORY_CARE_PROVIDER_SITE_OTHER): Payer: 59 | Admitting: Family Medicine

## 2022-09-16 VITALS — BP 128/72 | HR 83 | Ht 67.0 in | Wt 148.0 lb

## 2022-09-16 DIAGNOSIS — M19012 Primary osteoarthritis, left shoulder: Secondary | ICD-10-CM

## 2022-09-16 DIAGNOSIS — M25512 Pain in left shoulder: Secondary | ICD-10-CM | POA: Diagnosis not present

## 2022-09-16 NOTE — Patient Instructions (Addendum)
Dr. Tamera Punt [phone]- They will call you We are here if you have questions

## 2022-09-17 DIAGNOSIS — M19012 Primary osteoarthritis, left shoulder: Secondary | ICD-10-CM | POA: Insufficient documentation

## 2022-09-17 NOTE — Assessment & Plan Note (Addendum)
Patient's MRI showed the patient does have severe arthritis of the shoulder.  Due to the timeline and patient increasingly worsening concern that this could be more secondary to postradiation.  We discussed different treatment options including conservative and continuing some range of motion exercises as well as different types of injections.  We also discussed in great detail the possibility of surgery.  After the long discussion we will refer to orthopedic surgery so patient does know her options.  Patient does have a granddaughter who is going to be born in January so the timing of surgery can be difficult.  Total time with patient including reviewing imaging.35 minutes

## 2022-10-13 ENCOUNTER — Encounter: Payer: Self-pay | Admitting: *Deleted

## 2022-10-13 NOTE — Progress Notes (Signed)
Received preoperative risk assessment from [facility] for pt to undergo left total shoulder arthroplasty.  Per MD okay for pt to proceed with the recommendation to stop Anastrozole x1 week prior to surgery and resume 1 week post.  RN successfully faxed signed assessment to [phone].

## 2022-10-14 NOTE — Progress Notes (Incomplete)
Patient Care Team: Deland Pretty, MD as PCP - General (Internal Medicine)  DIAGNOSIS: No diagnosis found.  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[place])  04/14/2017 Initial Diagnosis   Left axillary lymph node biopsy: Metastatic carcinoma positive for CK 7, ER positive,GATA-3 equivocal, negative for CK 20,GCDFP, CDX-2, TTF-1; HER-2 positive ratio 2.08; mammogram and ultrasound revealed 3 discrete abnormal left axillary lymph nodes largest measures 1.8 cm   04/23/2017 Breast MRI   Left axillary lymphadenopathy numerous enlarged level I axillary lymph nodes largest 2.1 cm, no other abdominal masses in the left breast itself, no MRI evidence of malignancy in the right breast   04/26/2017 Imaging   CT CAP: Prominent left axillary lymph nodes no distant metastases, sclerotic focus left pedicle of T9 vertebra favored benign, T5 vertebral hemangioma   05/12/2017 - 08/31/2017 Neo-Adjuvant Chemotherapy   TCH Perjeta x6 followed by Herceptin Perjeta maintenance for 1 year   10/14/2017 Surgery   Left axillary lymph node dissection: 0/13 lymph nodes complete pathologic response.  Breast was not operated on because no breast primary was ever identified   12/09/2017 - 01/11/2018 Radiation Therapy   Adjuvant radiation therapy   01/30/2018 -  Anti-estrogen oral therapy   Anastrozole 1 mg daily, Neratinib started 05/13/2018   09/09/2018 Genetic Testing   The MyRisk gene panel offered by [employer] includes sequencing and deletion/duplication testing of the following 35 genes: APC, ATM, BARD1, BMPR1A, BRCA1, BRCA2, BRIP1, CHD1, CDK4, CDKN2A, CHEK2, EPCAM (large rearrangement only), GREM1, HOXB13, AXIN2, MSH3, NTHL1, RNF43, GALNT12, RSP20, MLH1, MSH2, MSH6, MUTYH, NBN, PALB2, PMS2, PTEN, RAD51C, RAD51D, SMAD4, STK11, and TP53. Sequencing was performed for select regions of POLE and POLD1, and large  rearrangement analysis was performed for select regions of GREM1.  Results: Negative, no pathogenic variants identified.  The date of this test report is 09/09/2018.     CHIEF COMPLIANT:   INTERVAL HISTORY: Melissa [name] is a   ALLERGIES:  has No Known Allergies.  MEDICATIONS:  Current Outpatient Medications  Medication Sig Dispense Refill   anastrozole (ARIMIDEX) 1 MG tablet TAKE ONE TABLET BY MOUTH DAILY 90 tablet 3   calcium carbonate (OS-CAL) 1250 (500 Ca) MG chewable tablet Chew 1 tablet by mouth daily. Calcium 600 mg     cholecalciferol (VITAMIN D) 1000 units tablet Take 1,000 Units by mouth daily. 2000 iu     rosuvastatin (CRESTOR) 10 MG tablet Take 10 mg by mouth daily.   0   triamterene-hydrochlorothiazide (MAXZIDE-25) 37.5-25 MG tablet Take 1 tablet by mouth daily.     zinc gluconate 50 MG tablet Take 1 tablet (50 mg total) by mouth daily.     No current facility-administered medications for this visit.    PHYSICAL EXAMINATION: ECOG PERFORMANCE STATUS: {CHL ONC ECOG [phone]}  There were no vitals filed for this visit. There were no vitals filed for this visit.  BREAST:*** No palpable masses or nodules in either right or left breasts. No palpable axillary supraclavicular or infraclavicular adenopathy no breast tenderness or nipple discharge. (exam performed in the presence of a chaperone)  LABORATORY DATA:  I have reviewed the data as listed    Latest Ref Rng & Units 03/01/2019   10:29 AM 01/30/2019   11:32 AM 12/01/2018   10:19 AM  CMP  Glucose 70 - 99 mg/dL 87  96  93   BUN 6 - 20 mg/dL 22  20  17  Creatinine 0.44 - 1.00 mg/dL 0.85  1.04  1.00   Sodium 135 - 145 mmol/L 139  140  141   Potassium 3.5 - 5.1 mmol/L 3.5  4.0  4.1   Chloride 98 - 111 mmol/L 98  101  105   CO2 22 - 32 mmol/L _0 Calcium 8.9 - 10.3 mg/dL 9.6  9.9  9.8   Total Protein 6.5 - 8.1 g/dL 7.6  7.7  6.9   Total Bilirubin 0.3 - 1.2 mg/dL 0.4  0.5  0.3   Alkaline Phos 38 -  126 U/L 89  93  97   AST 15 - 41 U/L _1 ALT 0 - 44 U/L _2 Lab Results  Component Value Date   WBC 6.1 03/01/2019   HGB 14.6 03/01/2019   HCT 43.7 03/01/2019   MCV 90.7 03/01/2019   PLT 352 03/01/2019   NEUTROABS 2.9 03/01/2019    ASSESSMENT & PLAN:  No problem-specific Assessment & Plan notes found for this encounter.    No orders of the defined types were placed in this encounter.  The patient has a good understanding of the overall plan. she agrees with it. she will call with any problems that may develop before the next visit here. Total time spent: 30 mins including face to face time and time spent for planning, charting and co-ordination of care   Melissa [name], [place] 10/14/22    I Gardiner Coins am scribing for Dr. Lindi Adie  ***

## 2022-10-15 ENCOUNTER — Encounter: Payer: Self-pay | Admitting: Hematology and Oncology

## 2022-10-15 ENCOUNTER — Inpatient Hospital Stay: Payer: 59 | Admitting: Hematology and Oncology

## 2022-10-15 NOTE — Assessment & Plan Note (Deleted)
04/14/2017 Left axillary lymph node biopsy: Metastatic carcinoma positive for CK 7, ER positive,GATA-3 equivocal, negative for CK 20,GCDFP, CDX-2, TTF-1; HER-2 positive ratio 2.08 mammogram and ultrasound revealed 3 discrete abnormal left axillary lymph nodes largest measures 1.8 cm Clinical stage: TX N1 MX   Treatment plan: 1. Neoadjuvant TCH Perjeta 6 cycles completed 08/31/2017 followed by Herceptin, Perjeta maintenance for 1 year 2. followed by axillary lymph node dissection. Breast surgery will not be performed because there is no primary tumor identifiable on breast MRI: 10/14/17: 0/13 LN Negative 3. Followed by radiation 12/08/2017-01/12/2018 4. Followed by antiestrogen therapy started 01/30/2018 5. Herceptin Perjeta maintenance completed 05/10/2018 6. Neratinib started 05/13/2018 discontinued 04/03/2019 ------------------------------------------------------------------------------------------------------------------ Current treatment: Anastrozole  Anastrozole toxicities: Tolerating it reasonably well.  She does have some joint stiffness in the morning but she gets better when she moves. Occasional hot flashes but they are much better than before.   Breast cancer surveillance: 1.  Breast exam 10/15/2022: Benign  2. Mammograms 06/02/2022: Benign, density category C 3.  Breast MRI November 2020: Benign: We discussed the pros and cons of obtaining a breast MRI.  Because of her insurance not being of good coverage, she felt that if the mammograms are good she may not proceed with breast MRI at this time.     Her daughter Melissa [name] now works in the [place] center. Return to clinic in 1 year for follow-up.

## 2022-10-18 NOTE — Progress Notes (Signed)
Patient Care Team: Deland Pretty, MD as PCP - General (Internal Medicine)  DIAGNOSIS: No diagnosis found.  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[place])  04/14/2017 Initial Diagnosis   Left axillary lymph node biopsy: Metastatic carcinoma positive for CK 7, ER positive,GATA-3 equivocal, negative for CK 20,GCDFP, CDX-2, TTF-1; HER-2 positive ratio 2.08; mammogram and ultrasound revealed 3 discrete abnormal left axillary lymph nodes largest measures 1.8 cm   04/23/2017 Breast MRI   Left axillary lymphadenopathy numerous enlarged level I axillary lymph nodes largest 2.1 cm, no other abdominal masses in the left breast itself, no MRI evidence of malignancy in the right breast   04/26/2017 Imaging   CT CAP: Prominent left axillary lymph nodes no distant metastases, sclerotic focus left pedicle of T9 vertebra favored benign, T5 vertebral hemangioma   05/12/2017 - 08/31/2017 Neo-Adjuvant Chemotherapy   TCH Perjeta x6 followed by Herceptin Perjeta maintenance for 1 year   10/14/2017 Surgery   Left axillary lymph node dissection: 0/13 lymph nodes complete pathologic response.  Breast was not operated on because no breast primary was ever identified   12/09/2017 - 01/11/2018 Radiation Therapy   Adjuvant radiation therapy   01/30/2018 -  Anti-estrogen oral therapy   Anastrozole 1 mg daily, Neratinib started 05/13/2018   09/09/2018 Genetic Testing   The MyRisk gene panel offered by [employer] includes sequencing and deletion/duplication testing of the following 35 genes: APC, ATM, BARD1, BMPR1A, BRCA1, BRCA2, BRIP1, CHD1, CDK4, CDKN2A, CHEK2, EPCAM (large rearrangement only), GREM1, HOXB13, AXIN2, MSH3, NTHL1, RNF43, GALNT12, RSP20, MLH1, MSH2, MSH6, MUTYH, NBN, PALB2, PMS2, PTEN, RAD51C, RAD51D, SMAD4, STK11, and TP53. Sequencing was performed for select regions of POLE and POLD1, and large  rearrangement analysis was performed for select regions of GREM1.  Results: Negative, no pathogenic variants identified.  The date of this test report is 09/09/2018.     CHIEF COMPLIANT: Follow-up of breast cancer on anastrozole   INTERVAL HISTORY: Melissa [name] is a 61 y.o. with above-mentioned history of breast cancer treated with neoadjuvant chemotherapy, left axillary lymph node dissection, radiation, adjuvant Herceptin Perjeta maintenance, neratinib, and is currently on antiestrogen therapy with anastrozole.    ALLERGIES:  has No Known Allergies.  MEDICATIONS:  Current Outpatient Medications  Medication Sig Dispense Refill   anastrozole (ARIMIDEX) 1 MG tablet TAKE ONE TABLET BY MOUTH DAILY 90 tablet 3   calcium carbonate (OS-CAL) 1250 (500 Ca) MG chewable tablet Chew 1 tablet by mouth daily. Calcium 600 mg     cholecalciferol (VITAMIN D) 1000 units tablet Take 1,000 Units by mouth daily. 2000 iu     rosuvastatin (CRESTOR) 10 MG tablet Take 10 mg by mouth daily.   0   triamterene-hydrochlorothiazide (MAXZIDE-25) 37.5-25 MG tablet Take 1 tablet by mouth daily.     zinc gluconate 50 MG tablet Take 1 tablet (50 mg total) by mouth daily.     No current facility-administered medications for this visit.    PHYSICAL EXAMINATION: ECOG PERFORMANCE STATUS: {CHL ONC ECOG PS:[id number]}  There were no vitals filed for this visit. There were no vitals filed for this visit.  BREAST:*** No palpable masses or nodules in either right or left breasts. No palpable axillary supraclavicular or infraclavicular adenopathy no breast tenderness or nipple discharge. (exam performed in the presence of a chaperone)  LABORATORY DATA:  I have reviewed the data as listed    Latest Ref Rng & Units 03/01/2019  10:29 AM 01/30/2019   11:32 AM 12/01/2018   10:19 AM  CMP  Glucose 70 - 99 mg/dL 87  96  93   BUN 6 - 20 mg/dL _0 Creatinine 0.44 - 1.00 mg/dL 0.85  1.04  1.00   Sodium 135 - 145  mmol/L 139  140  141   Potassium 3.5 - 5.1 mmol/L 3.5  4.0  4.1   Chloride 98 - 111 mmol/L 98  101  105   CO2 22 - 32 mmol/L _1 Calcium 8.9 - 10.3 mg/dL 9.6  9.9  9.8   Total Protein 6.5 - 8.1 g/dL 7.6  7.7  6.9   Total Bilirubin 0.3 - 1.2 mg/dL 0.4  0.5  0.3   Alkaline Phos 38 - 126 U/L 89  93  97   AST 15 - 41 U/L _2 ALT 0 - 44 U/L _3 Lab Results  Component Value Date   WBC 6.1 03/01/2019   HGB 14.6 03/01/2019   HCT 43.7 03/01/2019   MCV 90.7 03/01/2019   PLT 352 03/01/2019   NEUTROABS 2.9 03/01/2019    ASSESSMENT & PLAN:  No problem-specific Assessment & Plan notes found for this encounter.    No orders of the defined types were placed in this encounter.  The patient has a good understanding of the overall plan. she agrees with it. she will call with any problems that may develop before the next visit here. Total time spent: 30 mins including face to face time and time spent for planning, charting and co-ordination of care   Suzzette Righter, [place] 10/18/22    I Gardiner Coins am scribing for Dr. Lindi Adie  ***

## 2022-10-20 ENCOUNTER — Inpatient Hospital Stay: Payer: 59 | Attending: Hematology and Oncology | Admitting: Hematology and Oncology

## 2022-10-20 ENCOUNTER — Other Ambulatory Visit: Payer: Self-pay

## 2022-10-20 ENCOUNTER — Telehealth: Payer: Self-pay

## 2022-10-20 VITALS — BP 146/95 | HR 74 | Temp 97.3°F | Resp 18 | Ht 67.0 in | Wt 143.9 lb

## 2022-10-20 DIAGNOSIS — C773 Secondary and unspecified malignant neoplasm of axilla and upper limb lymph nodes: Secondary | ICD-10-CM | POA: Insufficient documentation

## 2022-10-20 DIAGNOSIS — Z9221 Personal history of antineoplastic chemotherapy: Secondary | ICD-10-CM | POA: Insufficient documentation

## 2022-10-20 DIAGNOSIS — Z79811 Long term (current) use of aromatase inhibitors: Secondary | ICD-10-CM | POA: Diagnosis not present

## 2022-10-20 DIAGNOSIS — Z79899 Other long term (current) drug therapy: Secondary | ICD-10-CM | POA: Diagnosis not present

## 2022-10-20 DIAGNOSIS — Z923 Personal history of irradiation: Secondary | ICD-10-CM | POA: Diagnosis not present

## 2022-10-20 DIAGNOSIS — Z170 Estrogen receptor positive status [ER+]: Secondary | ICD-10-CM | POA: Insufficient documentation

## 2022-10-20 DIAGNOSIS — C50912 Malignant neoplasm of unspecified site of left female breast: Secondary | ICD-10-CM | POA: Insufficient documentation

## 2022-10-20 MED ORDER — ALPRAZOLAM 0.5 MG PO TABS: 0.5000 mg | ORAL_TABLET | Freq: Every evening | ORAL | 0 refills | Status: DC | PRN

## 2022-10-20 NOTE — Telephone Encounter (Addendum)
Per pt's request, copy of EKG faxed to [facility] [phone]. Fax confirmation received.

## 2022-10-20 NOTE — Assessment & Plan Note (Signed)
04/14/2017 Left axillary lymph node biopsy: Metastatic carcinoma positive for CK 7, ER positive,GATA-3 equivocal, negative for CK 20,GCDFP, CDX-2, TTF-1; HER-2 positive ratio 2.08 mammogram and ultrasound revealed 3 discrete abnormal left axillary lymph nodes largest measures 1.8 cm Clinical stage: TX N1 MX   Treatment plan: 1. Neoadjuvant TCH Perjeta 6 cycles completed 08/31/2017 followed by Herceptin, Perjeta maintenance for 1 year 2. followed by axillary lymph node dissection. Breast surgery will not be performed because there is no primary tumor identifiable on breast MRI: 10/14/17: 0/13 LN Negative 3. Followed by radiation 12/08/2017-01/12/2018 4. Followed by antiestrogen therapy started 01/30/2018 5. Herceptin Perjeta maintenance completed 05/10/2018 6. Neratinib started 05/13/2018 discontinued 04/03/2019 ------------------------------------------------------------------------------------------------------------------ Current treatment: Anastrozole  Anastrozole toxicities: Tolerating it reasonably well.  She does have some joint stiffness in the morning but she gets better when she moves. Occasional hot flashes but they are much better than before.   Breast cancer surveillance: 1.  Breast exam 10/20/22: Benign, patient felt swelling in the left axilla which on palpation was underneath the scar and felt to be a scar tissue. 2. Mammograms 06/02/2022: Benign, density category C 3.  Breast MRI November 2020: Benign: We discussed the pros and cons of obtaining a breast MRI.  Because of her insurance not being of good coverage, she felt that if the mammograms are good she may not proceed with breast MRI at this time.  She will check with her insurance about her out-of-pocket cost and will inform us of her decision.   Her daughter Denny Peon now works in the [place] center. Return to clinic in 1 year for follow-up.

## 2022-10-22 ENCOUNTER — Encounter: Payer: Self-pay | Admitting: Hematology and Oncology

## 2023-03-14 ENCOUNTER — Other Ambulatory Visit: Payer: Self-pay | Admitting: Hematology and Oncology

## 2023-04-16 ENCOUNTER — Encounter: Payer: Self-pay | Admitting: Hematology and Oncology

## 2023-04-16 ENCOUNTER — Other Ambulatory Visit: Payer: Self-pay | Admitting: Hematology and Oncology

## 2023-04-16 DIAGNOSIS — Z1231 Encounter for screening mammogram for malignant neoplasm of breast: Secondary | ICD-10-CM

## 2023-06-14 ENCOUNTER — Ambulatory Visit
Admission: RE | Admit: 2023-06-14 | Discharge: 2023-06-14 | Disposition: A | Discharge status: Home / Self Care | Payer: 59 | Source: Ambulatory Visit | Attending: Hematology and Oncology | Admitting: Hematology and Oncology

## 2023-06-14 DIAGNOSIS — Z1231 Encounter for screening mammogram for malignant neoplasm of breast: Secondary | ICD-10-CM

## 2023-07-24 ENCOUNTER — Other Ambulatory Visit: Payer: Self-pay | Admitting: Hematology and Oncology

## 2023-07-26 NOTE — Telephone Encounter (Signed)
Per last OV in 10/2022, continue anastrazole. Lorayne Marek, RN

## 2023-10-21 ENCOUNTER — Inpatient Hospital Stay: Payer: BLUE CROSS/BLUE SHIELD | Attending: Hematology and Oncology | Admitting: Hematology and Oncology

## 2023-10-21 ENCOUNTER — Encounter: Payer: Self-pay | Admitting: Hematology and Oncology

## 2023-10-21 VITALS — BP 138/77 | HR 72 | Temp 97.2°F | Resp 18 | Ht 67.0 in | Wt 144.3 lb

## 2023-10-21 DIAGNOSIS — Z79899 Other long term (current) drug therapy: Secondary | ICD-10-CM | POA: Diagnosis not present

## 2023-10-21 DIAGNOSIS — Z923 Personal history of irradiation: Secondary | ICD-10-CM | POA: Insufficient documentation

## 2023-10-21 DIAGNOSIS — Z780 Asymptomatic menopausal state: Secondary | ICD-10-CM

## 2023-10-21 DIAGNOSIS — Z9221 Personal history of antineoplastic chemotherapy: Secondary | ICD-10-CM | POA: Diagnosis not present

## 2023-10-21 DIAGNOSIS — Z170 Estrogen receptor positive status [ER+]: Secondary | ICD-10-CM | POA: Insufficient documentation

## 2023-10-21 DIAGNOSIS — M2550 Pain in unspecified joint: Secondary | ICD-10-CM | POA: Diagnosis not present

## 2023-10-21 DIAGNOSIS — R232 Flushing: Secondary | ICD-10-CM | POA: Insufficient documentation

## 2023-10-21 DIAGNOSIS — C773 Secondary and unspecified malignant neoplasm of axilla and upper limb lymph nodes: Secondary | ICD-10-CM | POA: Diagnosis not present

## 2023-10-21 DIAGNOSIS — L931 Subacute cutaneous lupus erythematosus: Secondary | ICD-10-CM | POA: Diagnosis not present

## 2023-10-21 DIAGNOSIS — C50912 Malignant neoplasm of unspecified site of left female breast: Secondary | ICD-10-CM | POA: Insufficient documentation

## 2023-10-21 DIAGNOSIS — Z79811 Long term (current) use of aromatase inhibitors: Secondary | ICD-10-CM | POA: Insufficient documentation

## 2023-10-21 MED ORDER — HYDROXYCHLOROQUINE SULFATE 300 MG PO TABS: 1.0000 | ORAL_TABLET | Freq: Every day | ORAL | Status: AC

## 2023-10-21 NOTE — Progress Notes (Signed)
Patient Care Team: Merri Brunette, MD as PCP - General (Internal Medicine)  DIAGNOSIS:  Encounter Diagnoses  Name Primary?   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) Yes   Post-menopausal     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)  04/14/2017 Initial Diagnosis   Left axillary lymph node biopsy: Metastatic carcinoma positive for CK 7, ER positive,GATA-3 equivocal, negative for CK 20,GCDFP, CDX-2, TTF-1; HER-2 positive ratio 2.08; mammogram and ultrasound revealed 3 discrete abnormal left axillary lymph nodes largest measures 1.8 cm   04/23/2017 Breast MRI   Left axillary lymphadenopathy numerous enlarged level I axillary lymph nodes largest 2.1 cm, no other abdominal masses in the left breast itself, no MRI evidence of malignancy in the right breast   04/26/2017 Imaging   CT CAP: Prominent left axillary lymph nodes no distant metastases, sclerotic focus left pedicle of T9 vertebra favored benign, T5 vertebral hemangioma   05/12/2017 - 08/31/2017 Neo-Adjuvant Chemotherapy   TCH Perjeta x6 followed by Herceptin Perjeta maintenance for 1 year   10/14/2017 Surgery   Left axillary lymph node dissection: 0/13 lymph nodes complete pathologic response.  Breast was not operated on because no breast primary was ever identified   12/09/2017 - 01/11/2018 Radiation Therapy   Adjuvant radiation therapy   01/30/2018 -  Anti-estrogen oral therapy   Anastrozole 1 mg daily, Neratinib started 05/13/2018   09/09/2018 Genetic Testing   The MyRisk gene panel offered by [employer] includes sequencing and deletion/duplication testing of the following 35 genes: APC, ATM, BARD1, BMPR1A, BRCA1, BRCA2, BRIP1, CHD1, CDK4, CDKN2A, CHEK2, EPCAM (large rearrangement only), GREM1, HOXB13, AXIN2, MSH3, NTHL1, RNF43, GALNT12, RSP20, MLH1, MSH2, MSH6, MUTYH, NBN, PALB2,  PMS2, PTEN, RAD51C, RAD51D, SMAD4, STK11, and TP53. Sequencing was performed for select regions of POLE and POLD1, and large rearrangement analysis was performed for select regions of GREM1.  Results: Negative, no pathogenic variants identified.  The date of this test report is 09/09/2018.     CHIEF COMPLIANT: Follow-up on anastrozole therapy  HISTORY OF PRESENT ILLNESS:   History of Present Illness   The patient, a 62 year old with a history of breast cancer, presents for a routine follow-up. She has been on anastrozole for the past six years and reports no significant side effects. She expresses a desire to continue the medication, despite some mild joint pain, which she attributes to age rather than the medication.  The patient also reports a recent diagnosis of cutaneous lupus, which presented as a severe skin rash exacerbated by sun exposure. She is currently on hydroxychloroquine 300mg  daily, which seems to be controlling the symptoms effectively. She expresses concern about the potential for the disease to progress to systemic lupus, but is satisfied with the current treatment plan.  In addition, the patient underwent a total shoulder replacement since her last visit. She reports that the daily pain has resolved, but she still has limited range of motion. She is managing the residual soreness and is generally satisfied with the outcome of the surgery.         ALLERGIES:  has No Known Allergies.  MEDICATIONS:  Current Outpatient Medications  Medication Sig Dispense Refill   Hydroxychloroquine Sulfate 300 MG TABS Take 1 tablet (300 mg total) by mouth daily at 12 noon.     ALPRAZolam (XANAX) 0.5 MG tablet TAKE ONE TABLET BY MOUTH EVERY NIGHT AT BEDTIME AS NEEDED  FOR ANXIETY 30 tablet 3   anastrozole (ARIMIDEX) 1 MG tablet TAKE 1 TABLET BY MOUTH DAILY 90 tablet 3   calcium carbonate (OS-CAL) 1250 (500 Ca) MG chewable tablet Chew 1 tablet by mouth daily. Calcium 600 mg      cholecalciferol (VITAMIN D) 1000 units tablet Take 1,000 Units by mouth daily. 2000 iu     rosuvastatin (CRESTOR) 10 MG tablet Take 10 mg by mouth daily.   0   triamterene-hydrochlorothiazide (MAXZIDE-25) 37.5-25 MG tablet Take 1 tablet by mouth daily.     zinc gluconate 50 MG tablet Take 1 tablet (50 mg total) by mouth daily.     No current facility-administered medications for this visit.    PHYSICAL EXAMINATION: ECOG PERFORMANCE STATUS: 1 - Symptomatic but completely ambulatory  Vitals:   10/21/23 1104  BP: 138/77  Pulse: 72  Resp: 18  Temp: (!) 97.2 F (36.2 C)  SpO2: 100%   Filed Weights   10/21/23 1104  Weight: 144 lb 4.8 oz (65.5 kg)     LABORATORY DATA:  I have reviewed the data as listed    Latest Ref Rng & Units 03/01/2019   10:29 AM 01/30/2019   11:32 AM 12/01/2018   10:19 AM  CMP  Glucose 70 - 99 mg/dL 87  96  93   BUN 6 - 20 mg/dL 22  20  17    Creatinine 0.44 - 1.00 mg/dL 7.82  9.56  2.13   Sodium 135 - 145 mmol/L 139  140  141   Potassium 3.5 - 5.1 mmol/L 3.5  4.0  4.1   Chloride 98 - 111 mmol/L 98  101  105   CO2 22 - 32 mmol/L 30  26  28    Calcium 8.9 - 10.3 mg/dL 9.6  9.9  9.8   Total Protein 6.5 - 8.1 g/dL 7.6  7.7  6.9   Total Bilirubin 0.3 - 1.2 mg/dL 0.4  0.5  0.3   Alkaline Phos 38 - 126 U/L 89  93  97   AST 15 - 41 U/L 15  21  19    ALT 0 - 44 U/L 17  16  16      Lab Results  Component Value Date   WBC 6.1 03/01/2019   HGB 14.6 03/01/2019   HCT 43.7 03/01/2019   MCV 90.7 03/01/2019   PLT 352 03/01/2019   NEUTROABS 2.9 03/01/2019    ASSESSMENT & PLAN:  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) 04/14/2017 Left axillary lymph node biopsy: Metastatic carcinoma positive for CK 7, ER positive,GATA-3 equivocal, negative for CK 20,GCDFP, CDX-2, TTF-1; HER-2 positive ratio 2.08 mammogram and ultrasound revealed 3 discrete abnormal left axillary lymph nodes largest measures 1.8 cm Clinical stage: TX N1  MX   Treatment plan: 1. Neoadjuvant TCH Perjeta 6 cycles completed 08/31/2017 followed by Herceptin, Perjeta maintenance for 1 year 2. followed by axillary lymph node dissection. Breast surgery will not be performed because there is no primary tumor identifiable on breast MRI: 10/14/17: 0/13 LN Negative 3. Followed by radiation 12/08/2017-01/12/2018 4. Followed by antiestrogen therapy started 01/30/2018 5. Herceptin Perjeta maintenance completed 05/10/2018 6. Neratinib started 05/13/2018 discontinued 04/03/2019 ------------------------------------------------------------------------------------------------------------------ Current treatment: Anastrozole  Anastrozole toxicities: Tolerating it reasonably well.  She does have some joint stiffness in the morning but she gets better when she moves. Occasional hot flashes but they are much better than before.   Breast cancer surveillance: 1.  Breast exam 10/21/23: Benign, 2. Mammograms  06/15/2023: Benign, density category C We will order a breast MRI for March 2025. [facility] density be ordered in the [facility].  Patient underwent a shoulder replacement surgery. She was also diagnosed with cutaneous lupus and is on hydroxychloroquine. Return to clinic in 1 year for follow-up     Orders Placed This Encounter  Procedures   MR BREAST BILATERAL W WO CONTRAST INC CAD    Standing Status:   Future    Standing Expiration Date:   10/20/2024    Order Specific Question:   If indicated for the ordered procedure, I authorize the administration of contrast media per Radiology protocol    Answer:   Yes    Order Specific Question:   What is the patient's sedation requirement?    Answer:   No Sedation    Order Specific Question:   Does the patient have a pacemaker or implanted devices?    Answer:   No    Order Specific Question:   Preferred imaging location?    Answer:   [facility] (table limit-550lbs)    Order Specific Question:    Release to patient    Answer:   Immediate   DG Bone Density    Standing Status:   Future    Standing Expiration Date:   10/20/2024    Order Specific Question:   Reason for Exam (SYMPTOM  OR DIAGNOSIS REQUIRED)    Answer:   post menopausal    Order Specific Question:   Preferred imaging location?    Answer:   [facility]    Order Specific Question:   Release to patient    Answer:   Immediate   The patient has a good understanding of the overall plan. she agrees with it. she will call with any problems that may develop before the next visit here. Total time spent: 30 mins including face to face time and time spent for planning, charting and co-ordination of care   Tamsen Meek, MD 10/21/23

## 2023-10-21 NOTE — Assessment & Plan Note (Addendum)
04/14/2017 Left axillary lymph node biopsy: Metastatic carcinoma positive for CK 7, ER positive,GATA-3 equivocal, negative for CK 20,GCDFP, CDX-2, TTF-1; HER-2 positive ratio 2.08 mammogram and ultrasound revealed 3 discrete abnormal left axillary lymph nodes largest measures 1.8 cm Clinical stage: TX N1 MX   Treatment plan: 1. Neoadjuvant TCH Perjeta 6 cycles completed 08/31/2017 followed by Herceptin, Perjeta maintenance for 1 year 2. followed by axillary lymph node dissection. Breast surgery will not be performed because there is no primary tumor identifiable on breast MRI: 10/14/17: 0/13 LN Negative 3. Followed by radiation 12/08/2017-01/12/2018 4. Followed by antiestrogen therapy started 01/30/2018 5. Herceptin Perjeta maintenance completed 05/10/2018 6. Neratinib started 05/13/2018 discontinued 04/03/2019 ------------------------------------------------------------------------------------------------------------------ Current treatment: Anastrozole  Anastrozole toxicities: Tolerating it reasonably well.  She does have some joint stiffness in the morning but she gets better when she moves. Occasional hot flashes but they are much better than before.   Breast cancer surveillance: 1.  Breast exam 10/21/23: Benign, 2. Mammograms 06/15/2023: Benign, density category C We will order a breast MRI for March 2025. Bone density be ordered in the next 1 month at [facility].  Patient underwent a shoulder replacement surgery. She was also diagnosed with cutaneous lupus and is on hydroxychloroquine. Return to clinic in 1 year for follow-up

## 2023-11-01 ENCOUNTER — Telehealth (HOSPITAL_BASED_OUTPATIENT_CLINIC_OR_DEPARTMENT_OTHER): Payer: Self-pay | Admitting: Hematology and Oncology

## 2023-11-02 DIAGNOSIS — L568 Other specified acute skin changes due to ultraviolet radiation: Secondary | ICD-10-CM | POA: Diagnosis not present

## 2023-11-02 DIAGNOSIS — L259 Unspecified contact dermatitis, unspecified cause: Secondary | ICD-10-CM | POA: Diagnosis not present

## 2023-11-02 DIAGNOSIS — L932 Other local lupus erythematosus: Secondary | ICD-10-CM | POA: Diagnosis not present

## 2023-11-02 DIAGNOSIS — R21 Rash and other nonspecific skin eruption: Secondary | ICD-10-CM | POA: Diagnosis not present

## 2023-11-17 DIAGNOSIS — M25512 Pain in left shoulder: Secondary | ICD-10-CM | POA: Diagnosis not present

## 2023-12-14 ENCOUNTER — Encounter: Payer: Self-pay | Admitting: Hematology and Oncology

## 2023-12-15 ENCOUNTER — Telehealth: Payer: Self-pay | Admitting: *Deleted

## 2023-12-15 ENCOUNTER — Ambulatory Visit (HOSPITAL_BASED_OUTPATIENT_CLINIC_OR_DEPARTMENT_OTHER)
Admission: RE | Admit: 2023-12-15 | Discharge: 2023-12-15 | Disposition: A | Discharge status: Home / Self Care | Payer: BLUE CROSS/BLUE SHIELD | Source: Ambulatory Visit | Attending: Hematology and Oncology | Admitting: Hematology and Oncology

## 2023-12-15 DIAGNOSIS — Z780 Asymptomatic menopausal state: Secondary | ICD-10-CM | POA: Diagnosis not present

## 2023-12-15 DIAGNOSIS — M810 Age-related osteoporosis without current pathological fracture: Secondary | ICD-10-CM | POA: Diagnosis not present

## 2023-12-15 NOTE — Telephone Encounter (Signed)
 Placed call to pt regarding recent bone density results showing Osteopenia and the need for office visit to discuss bisphosphonate therapy.  Appt scheduled.

## 2023-12-21 ENCOUNTER — Inpatient Hospital Stay: Payer: BLUE CROSS/BLUE SHIELD | Attending: Hematology and Oncology | Admitting: Adult Health

## 2023-12-21 DIAGNOSIS — C773 Secondary and unspecified malignant neoplasm of axilla and upper limb lymph nodes: Secondary | ICD-10-CM | POA: Diagnosis not present

## 2023-12-21 NOTE — Assessment & Plan Note (Signed)
 04/14/2017 Left axillary lymph node biopsy: Metastatic carcinoma positive for CK 7, ER positive,GATA-3 equivocal, negative for CK 20,GCDFP, CDX-2, TTF-1; HER-2 positive ratio 2.08 mammogram and ultrasound revealed 3 discrete abnormal left axillary lymph nodes largest measures 1.8 cm Clinical stage: TX N1 MX   Treatment plan: 1. Neoadjuvant TCH Perjeta  6 cycles completed 08/31/2017 followed by Herceptin , Perjeta  maintenance for 1 year 2. followed by axillary lymph node dissection. Breast surgery will not be performed because there is no primary tumor identifiable on breast MRI: 10/14/17: 0/13 LN Negative 3. Followed by radiation 12/08/2017-01/12/2018 4. Followed by antiestrogen therapy started 01/30/2018 5. Herceptin  Perjeta  maintenance completed 05/10/2018 6. Neratinib  started 05/13/2018 discontinued 04/03/2019 ------------------------------------------------------------------------------------------------------------------ Current treatment: Anastrozole   Anastrozole  toxicities: Tolerating it reasonably well.  She does have some joint stiffness in the morning but she gets better when she moves. Occasional hot flashes but they are much better than before.   Breast cancer surveillance: 1.  Breast exam 10/21/23: Benign, 2. Mammograms 06/15/2023: Benign, density category C  Osteoporosis Recent bone density test showed a T score of -2.8 in the left forearm, indicating osteoporosis. However, this result is inconsistent with previous tests on the right forearm which were normal. The patient has a history of left breast cancer with lymphedema and has undergone shoulder replacement surgery on the left side. The patient also reports favoring the right side for weight-bearing activities. -Contact radiology to discuss the discrepancy in bone density results between the left and right forearms. -Consider retesting the right forearm for a more accurate comparison. -Continue current regimen of calcium (600mg  daily) and  vitamin D (1000 units daily). -Increase weight-bearing exercises, particularly for the left arm.  Breast Cancer Patient is currently on anastrozole  for breast cancer treatment. There is concern about the potential impact of this medication on bone density, but the decision has been made to continue the medication at this time. -Continue anastrozole  as prescribed. -Monitor bone density closely due to potential impact of anastrozole .  I will f/u with Melissa [name] as I hear back from radiology.

## 2023-12-21 NOTE — Progress Notes (Signed)
 [facility] Cancer Follow up:    Melissa Nottingham, MD 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  I discussed the limitations, risks, security and privacy concerns of performing an evaluation and management service by telephone and the availability of in person appointments.  I also discussed with the patient that there may be a patient responsible charge related to this service. The patient expressed understanding and agreed to proceed.   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)  04/14/2017 Initial Diagnosis   Left axillary lymph node biopsy: Metastatic carcinoma positive for CK 7, ER positive,GATA-3 equivocal, negative for CK 20,GCDFP, CDX-2, TTF-1; HER-2 positive ratio 2.08; mammogram and ultrasound revealed 3 discrete abnormal left axillary lymph nodes largest measures 1.8 cm   04/23/2017 Breast MRI   Left axillary lymphadenopathy numerous enlarged level I axillary lymph nodes largest 2.1 cm, no other abdominal masses in the left breast itself, no MRI evidence of malignancy in the right breast   04/26/2017 Imaging   CT CAP: Prominent left axillary lymph nodes no distant metastases, sclerotic focus left pedicle of T9 vertebra favored benign, T5 vertebral hemangioma   05/12/2017 - 08/31/2017 Neo-Adjuvant Chemotherapy   TCH Perjeta  x6 followed by Herceptin  Perjeta  maintenance for 1 year    10/14/2017 Surgery   Left axillary lymph node dissection: 0/13 lymph nodes complete pathologic response.  Breast was not operated on because no breast primary was ever identified   12/09/2017 - 01/11/2018 Radiation Therapy   Adjuvant radiation therapy   01/30/2018 -  Anti-estrogen oral therapy   Anastrozole  1 mg daily, Neratinib  started 05/13/2018   09/09/2018 Genetic Testing   The MyRisk gene panel offered by [employer] includes sequencing and deletion/duplication testing of the following 35 genes: APC, ATM, BARD1, BMPR1A, BRCA1, BRCA2, BRIP1, CHD1, CDK4, CDKN2A, CHEK2, EPCAM (large rearrangement only), GREM1, HOXB13, AXIN2, MSH3, NTHL1, RNF43, GALNT12, RSP20, MLH1, MSH2, MSH6, MUTYH, NBN, PALB2, PMS2, PTEN, RAD51C, RAD51D, SMAD4, STK11, and TP53. Sequencing was performed for select regions of POLE and POLD1, and large rearrangement analysis was performed for select regions of GREM1.  Results: Negative, no pathogenic variants identified.  The date of this test report is 09/09/2018.     CURRENT THERAPY: Anastrozole   INTERVAL HISTORY:  Discussed the use of AI scribe software for clinical note transcription with the patient, who gave verbal consent to proceed.  Melissa [name] 63 y.o. female ith a history of left breast cancer, lymphedema, and recent shoulder replacement, presents with concerns about a significant decrease in bone density in the left forearm. The patient reports that the left arm has been through radiation treatment, has limited mobility due to a recent shoulder replacement, and is generally weaker and less used than the right arm. The patient is active, caring for a young grandchild, and engages in weight-bearing activities, but primarily uses the right arm. The patient is currently on anastrozole , a medication known to affect bone density. The  patient expresses concern that the recent bone density test, which was performed on the left arm due to a bracelet on the  right arm, may not accurately reflect their overall bone health. The patient suggests that the left arm's history of medical issues and less frequent use may have contributed to the lower bone density score.  Her bone density testing on December 15, 2023 demonstrated a left forearm T-score of -2.8, whereas her bone density testing on December 30, 2020 demonstrated a right forearm T-score of -0.4.  She continues to take anastrozole  daily.   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  MEDICAL HISTORY: Past Medical History:  Diagnosis Date   Breast cancer (HCC) 04/2017   left breast   Cervical cancer (HCC)    1990, treated with hysterectomy only   Family history of breast cancer    Family history of prostate cancer    GERD (gastroesophageal reflux disease)    History of radiation therapy 12/09/17- 01/12/18   Left Breast treated to 50 Gy with 25 fx of 2 Gy   Hypercholesteremia    Hypertension    Personal history of chemotherapy 2018   Left Breast Cancer   Personal history of radiation therapy 2018   Left Breast Cancer    SURGICAL HISTORY: Past Surgical History:  Procedure Laterality Date   ABDOMINAL HYSTERECTOMY  1990   AXILLARY LYMPH NODE DISSECTION Left 10/14/2017   Procedure: LEFT AXILLARY LYMPH NODE DISSECTION;  Surgeon: Ebbie Cough, MD;  Location:  [facility];  Service: General;  Laterality: Left;   BREAST LUMPECTOMY Left    04/2017   EYE SURGERY     eye lift   PORTACATH PLACEMENT Right 05/12/2017   Procedure: INSERTION PORT-A-CATH WITH ULTRASOUND;  Surgeon: Ebbie Cough, MD;  Location: [facility];  Service: General;  Laterality: Right;    SOCIAL HISTORY: Social History   Socioeconomic History   Marital status: Married    Spouse name: Not on file   Number of children: Not on file   Years of education: Not on file   Highest education level: Not on file  Occupational History   Not on file  Tobacco Use   Smoking status: Never   Smokeless tobacco: Never  Vaping Use   Vaping status: Never Used  Substance and Sexual Activity   Alcohol use: Yes    Alcohol/week: 2.0 standard drinks of alcohol    Types: 2 Glasses of wine per week    Comment: socially   Drug use: No   Sexual activity: Not on file  Other Topics Concern   Not on file  Social History Narrative   Not on file   Social Drivers of Health   Financial Resource Strain: Not on file  Food Insecurity: Not on file  Transportation Needs: Not on file  Physical Activity: Not on file  Stress: Not on file  Social Connections: Unknown (04/21/2022)   Received from [facility], [facility]   Social Network    Social Network: Not on file  Intimate Partner Violence: Unknown (03/13/2022)   [facility] from [facility], [facility]   HITS    Physically Hurt: Not on file    Insult or Talk Down To: Not on file    Threaten Physical Harm: Not on file    Scream or Curse: Not on file    FAMILY HISTORY: Family History  Problem Relation Age of Onset   Lung cancer Father 3   Prostate cancer Father    Cervical cancer Sister    Breast cancer Maternal Aunt        'postmenopausal'   Prostate cancer Maternal Grandfather        dx 60+   Mesothelioma Maternal Grandfather        aesbestos exposure    Review of Systems  Constitutional:   Negative for appetite change, chills, fatigue, fever and unexpected weight change.  HENT:   Negative for hearing loss, lump/mass and trouble swallowing.   Eyes:  Negative for eye problems and icterus.  Respiratory:  Negative for chest tightness, cough and shortness of breath.   Cardiovascular:  Negative for chest pain, leg swelling and palpitations.  Gastrointestinal:  Negative for abdominal distention, abdominal pain, constipation, diarrhea, nausea and vomiting.  Endocrine: Negative for hot flashes.  Genitourinary:  Negative for difficulty urinating.   Musculoskeletal:  Negative for arthralgias.  Skin:  Negative for itching and rash.  Neurological:  Negative for dizziness, extremity weakness, headaches and numbness.  Hematological:  Negative for adenopathy. Does not bruise/bleed easily.  Psychiatric/Behavioral:  Negative for depression. The patient is not nervous/anxious.       PHYSICAL EXAMINATION Patient sounds well is in no apparent distress mood and behavior are normal, speech is normal.    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 Neoadjuvant TCH Perjeta  6 cycles completed 08/31/2017 followed by Herceptin , Perjeta  maintenance for 1 year 2. followed by axillary lymph node dissection. Breast surgery will not be performed because there is no primary tumor identifiable on breast MRI: 10/14/17: 0/13 LN Negative 3. Followed by radiation 12/08/2017-01/12/2018 4. Followed by antiestrogen therapy started 01/30/2018 5. Herceptin  Perjeta  maintenance completed 05/10/2018 6. Neratinib  started  05/13/2018 discontinued 04/03/2019 ------------------------------------------------------------------------------------------------------------------ Current treatment: Anastrozole   Anastrozole  toxicities: Tolerating it reasonably well.  She does have some joint stiffness in the morning but she gets better when she moves. Occasional hot flashes but they are much better than before.   Breast cancer surveillance: 1.  Breast exam 10/21/23: Benign, 2. Mammograms 06/15/2023: Benign, density category C  Osteoporosis Recent bone density test showed a T score of -2.8 in the left forearm, indicating osteoporosis. However, this result is inconsistent with previous tests on the right forearm which were normal. The patient has a history of left breast cancer with lymphedema and has undergone shoulder replacement surgery on the left side. The patient also reports favoring the right side for weight-bearing activities. -Contact radiology to discuss the discrepancy in bone density results between the left and right forearms. -Consider retesting  the right forearm for a more accurate comparison. -Continue current regimen of calcium (600mg  daily) and vitamin D (1000 units daily). -Increase weight-bearing exercises, particularly for the left arm.  Breast Cancer Patient is currently on anastrozole  for breast cancer treatment. There is concern about the potential impact of this medication on bone density, but the decision has been made to continue the medication at this time. -Continue anastrozole  as prescribed. -Monitor bone density closely due to potential impact of anastrozole .  I will f/u with Fatema as I hear back from radiology.     The patient was provided an opportunity to ask questions and all were answered. The patient agreed with the plan and demonstrated an understanding of the instructions.   The patient was advised to call back or seek an in-person evaluation if the symptoms worsen or if the condition  fails to improve as anticipated.   I provided 25 minutes of non face-to-face telephone visit time during this encounter, and > 50% was spent counseling as documented under my assessment & plan.  Morna Kendall, NP 12/21/23 12:20 PM Medical Oncology and Hematology [facility] [address] [place], [place] [address] Tel. [phone]    Fax. [phone]

## 2023-12-24 ENCOUNTER — Encounter: Payer: Self-pay | Admitting: Adult Health

## 2024-01-04 NOTE — Telephone Encounter (Signed)
Thanks for following up.  I reached out to the radiologist and will let you know as soon as I do.  If you don't hear from me in a day or so, please reach back out to me.    Mardella Layman

## 2024-01-06 ENCOUNTER — Encounter: Payer: Self-pay | Admitting: *Deleted

## 2024-01-06 NOTE — Progress Notes (Signed)
Per MD request RN successfully faxed [employer] orders.

## 2024-01-12 ENCOUNTER — Encounter: Payer: Self-pay | Admitting: Adult Health

## 2024-01-13 ENCOUNTER — Encounter: Payer: Self-pay | Admitting: Adult Health

## 2024-01-13 ENCOUNTER — Inpatient Hospital Stay: Payer: BLUE CROSS/BLUE SHIELD | Attending: Hematology and Oncology | Admitting: Adult Health

## 2024-01-13 DIAGNOSIS — C773 Secondary and unspecified malignant neoplasm of axilla and upper limb lymph nodes: Secondary | ICD-10-CM | POA: Diagnosis not present

## 2024-01-13 DIAGNOSIS — M816 Localized osteoporosis [Lequesne]: Secondary | ICD-10-CM | POA: Diagnosis not present

## 2024-01-13 MED ORDER — ALENDRONATE SODIUM 70 MG PO TABS
70.0000 mg | ORAL_TABLET | ORAL | 3 refills | Status: DC
Start: 2024-01-13 — End: 2024-10-31

## 2024-01-13 NOTE — Assessment & Plan Note (Signed)
 04/14/2017 Left axillary lymph node biopsy: Metastatic carcinoma positive for CK 7, ER positive,GATA-3 equivocal, negative for CK 20,GCDFP, CDX-2, TTF-1; HER-2 positive ratio 2.08 mammogram and ultrasound revealed 3 discrete abnormal left axillary lymph nodes largest measures 1.8 cm Clinical stage: TX N1 MX   Treatment plan: 1. Neoadjuvant TCH Perjeta  6 cycles completed 08/31/2017 followed by Herceptin , Perjeta  maintenance for 1 year 2. followed by axillary lymph node dissection. Breast surgery will not be performed because there is no primary tumor identifiable on breast MRI: 10/14/17: 0/13 LN Negative 3. Followed by radiation 12/08/2017-01/12/2018 4. Followed by antiestrogen therapy started 01/30/2018 5. Herceptin  Perjeta  maintenance completed 05/10/2018 6. Neratinib  started 05/13/2018 discontinued 04/03/2019 ------------------------------------------------------------------------------------------------------------------ Current treatment: Anastrozole   Anastrozole  toxicities: Tolerating it reasonably well.  She does have some joint stiffness in the morning but she gets better when she moves. Occasional hot flashes but they are much better than before.   Breast cancer surveillance: 1.  Breast exam 10/21/23: Benign, 2. Mammograms 06/15/2023: Benign, density category C  Breast Cancer Patient is currently on anastrozole  for breast cancer treatment.  -Continue anastrozole  as prescribed. -Monitor bone density closely due to potential impact of anastrozole .  Osteoporosis Recent bone density test showed a T score of -2.8 in the left forearm, indicating osteoporosis. Right forearm bone density retest shows t score of -1.7, declining from -0.4 3 years prior.  -Reviewed recommended calcium and vitamin d supplementation -reviewed weight bearing exercises recommendation -Reviewed risks and benefits of Fosamax  weekly and patient has opted to take medication  RTC in 10/2024 for f/u with Dr. Odean.  Repeat bone  density testing in 2 years.

## 2024-01-13 NOTE — Progress Notes (Signed)
 [facility] Cancer Follow up:    Melissa Nottingham, MD 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  I discussed the limitations, risks, security and privacy concerns of performing an evaluation and management service by telephone and the availability of in person appointments.  I also discussed with the patient that there may be a patient responsible charge related to this service. The patient expressed understanding and agreed to proceed.  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)  04/14/2017 Initial Diagnosis   Left axillary lymph node biopsy: Metastatic carcinoma positive for CK 7, ER positive,GATA-3 equivocal, negative for CK 20,GCDFP, CDX-2, TTF-1; HER-2 positive ratio 2.08; mammogram and ultrasound revealed 3 discrete abnormal left axillary lymph nodes largest measures 1.8 cm   04/23/2017 Breast MRI   Left axillary lymphadenopathy numerous enlarged level I axillary lymph nodes largest 2.1 cm, no other abdominal masses in the left breast itself, no MRI evidence of malignancy in the right breast   04/26/2017 Imaging   CT CAP: Prominent left axillary lymph nodes no distant metastases, sclerotic focus left pedicle of T9 vertebra favored benign, T5 vertebral hemangioma   05/12/2017 - 08/31/2017 Neo-Adjuvant Chemotherapy   TCH Perjeta  x6 followed by Herceptin  Perjeta  maintenance for 1  year   10/14/2017 Surgery   Left axillary lymph node dissection: 0/13 lymph nodes complete pathologic response.  Breast was not operated on because no breast primary was ever identified   12/09/2017 - 01/11/2018 Radiation Therapy   Adjuvant radiation therapy   01/30/2018 -  Anti-estrogen oral therapy   Anastrozole  1 mg daily, Neratinib  started 05/13/2018   09/09/2018 Genetic Testing   The MyRisk gene panel offered by [employer] includes sequencing and deletion/duplication testing of the following 35 genes: APC, ATM, BARD1, BMPR1A, BRCA1, BRCA2, BRIP1, CHD1, CDK4, CDKN2A, CHEK2, EPCAM (large rearrangement only), GREM1, HOXB13, AXIN2, MSH3, NTHL1, RNF43, GALNT12, RSP20, MLH1, MSH2, MSH6, MUTYH, NBN, PALB2, PMS2, PTEN, RAD51C, RAD51D, SMAD4, STK11, and TP53. Sequencing was performed for select regions of POLE and POLD1, and large rearrangement analysis was performed for select regions of GREM1.  Results: Negative, no pathogenic variants identified.  The date of this test report is 09/09/2018.     CURRENT THERAPY: Anastrozole   INTERVAL HISTORY: Melissa [name] 63 y.o. female returns for follow-up after her bone density testing was repeated.  It showed that the clot had in her right forearm bone density from a T-score -0.4 to a T-score -1.7.  We again reviewed that she still has osteoporosis in her left forearm with a T-score -2.8.  She continues on anastrozole  with good tolerance.  She is taking calcium and vitamin D.   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  MEDICAL HISTORY: Past Medical History:  Diagnosis Date   Breast cancer (HCC) 04/2017   left breast   Cervical cancer (HCC)    1990, treated with hysterectomy only   Family history of breast cancer    Family history of prostate cancer    GERD (gastroesophageal reflux disease)    History of radiation therapy 12/09/17- 01/12/18   Left Breast treated to 50 Gy with 25 fx of 2 Gy   Hypercholesteremia    Hypertension    Personal history of chemotherapy 2018   Left Breast Cancer   Personal history of radiation therapy 2018   Left Breast Cancer    SURGICAL HISTORY: Past Surgical History:  Procedure Laterality Date   ABDOMINAL HYSTERECTOMY  1990   AXILLARY LYMPH NODE DISSECTION Left 10/14/2017   Procedure: LEFT AXILLARY LYMPH NODE DISSECTION;  Surgeon: Ebbie Cough, MD;  Location: [facility];  Service: General;  Laterality: Left;   BREAST LUMPECTOMY Left    04/2017   EYE SURGERY     eye lift   PORTACATH PLACEMENT Right 05/12/2017   Procedure: INSERTION PORT-A-CATH WITH ULTRASOUND;  Surgeon: Ebbie Cough, MD;  Location: [facility];  Service: General;  Laterality: Right;    SOCIAL HISTORY: Social History   Socioeconomic History   Marital status: Married    Spouse name: Not on file   Number of children: Not on file   Years of education: Not on file   Highest education level: Not on file  Occupational History   Not on file  Tobacco Use   Smoking status: Never   Smokeless tobacco: Never  Vaping Use   Vaping status: Never Used  Substance and Sexual Activity   Alcohol use: Yes    Alcohol/week: 2.0 standard drinks of alcohol    Types: 2 Glasses of wine per week    Comment: socially   Drug use: No   Sexual activity: Not on file   Other Topics Concern   Not on file  Social History Narrative   Not on file   Social Drivers of Health   Financial Resource Strain: Not on file  Food Insecurity: Not on file  Transportation Needs: Not on file  Physical Activity: Not on file  Stress: Not on file  Social Connections: Unknown (04/21/2022)   [facility]   Social Network    Social Network: Not on file  Intimate Partner Violence: Unknown (03/13/2022)   Received from [facility], [facility]   HITS    Physically Hurt: Not on file    Insult or Talk Down To: Not on file    Threaten Physical Harm: Not on file    Scream or Curse: Not on file    FAMILY HISTORY: Family History  Problem Relation Age of Onset   Lung cancer Father 47   Prostate cancer Father    Cervical cancer Sister    Breast cancer Maternal Aunt        'postmenopausal'   Prostate cancer Maternal Grandfather        dx 60+   Mesothelioma  Maternal Grandfather        aesbestos exposure    Review of Systems  Constitutional:  Negative for appetite change, chills, fatigue, fever and unexpected weight change.  HENT:   Negative for hearing loss, lump/mass and trouble swallowing.   Eyes:  Negative for eye problems and icterus.  Respiratory:  Negative for chest tightness, cough and shortness of breath.   Cardiovascular:  Negative for chest pain, leg swelling and palpitations.  Gastrointestinal:  Negative for abdominal distention, abdominal pain, constipation, diarrhea, nausea and vomiting.  Endocrine: Negative for hot flashes.  Genitourinary:  Negative for difficulty urinating.   Musculoskeletal:  Negative for arthralgias.  Skin:  Negative for itching and rash.  Neurological:  Negative for dizziness, extremity weakness, headaches and numbness.  Hematological:  Negative for adenopathy. Does not bruise/bleed easily.  Psychiatric/Behavioral:  Negative for depression. The patient is not nervous/anxious.       PHYSICAL  EXAMINATION  Patient sounds well.  She is in no apparent distress.  Mood and behavior are normal.      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 Neoadjuvant TCH Perjeta  6 cycles completed 08/31/2017 followed by Herceptin , Perjeta  maintenance for 1 year 2. followed by axillary lymph node dissection. Breast surgery will not be performed because there is no primary tumor identifiable on breast MRI: 10/14/17: 0/13 LN Negative 3. Followed by radiation 12/08/2017-01/12/2018 4. Followed by antiestrogen therapy started 01/30/2018 5. Herceptin  Perjeta  maintenance completed 05/10/2018 6. Neratinib  started 05/13/2018 discontinued 04/03/2019 ------------------------------------------------------------------------------------------------------------------ Current treatment: Anastrozole   Anastrozole  toxicities: Tolerating it reasonably well.  She does have some joint stiffness in the morning but she gets better when she moves. Occasional hot flashes but they are much better than before.   Breast cancer surveillance: 1.  Breast exam 10/21/23: Benign, 2. Mammograms 06/15/2023: Benign, density category C  Breast Cancer Patient is currently on anastrozole  for breast cancer treatment.  -Continue anastrozole  as prescribed. -Monitor bone density closely due to potential impact of anastrozole .  Osteoporosis Recent bone density test showed a T score of -2.8 in the left forearm, indicating osteoporosis. Right forearm bone density retest shows t score of -1.7, declining from -0.4 3 years prior.  -Reviewed recommended  calcium and vitamin d supplementation -reviewed weight bearing exercises recommendation -Reviewed risks and benefits of Fosamax  weekly and patient has opted to take medication  RTC in 10/2024 for f/u with Dr. Gudena.  Repeat bone density testing in 2 years.       The patient was provided an opportunity to ask questions and all were answered. The patient agreed with the plan and demonstrated an understanding of the instructions.   The patient was advised to call back or seek an in-person evaluation if the symptoms worsen or if the condition fails to improve as anticipated.   I provided 10 minutes of non face-to-face telephone visit time during this encounter, and > 50% was spent counseling as documented under my assessment & plan.  Morna Kendall, NP 01/13/24 12:26 PM Medical Oncology and Hematology [facility] [address] [place], [place] [address] Tel. [phone]    Fax. [phone]

## 2024-01-26 DIAGNOSIS — L568 Other specified acute skin changes due to ultraviolet radiation: Secondary | ICD-10-CM | POA: Diagnosis not present

## 2024-01-26 DIAGNOSIS — L259 Unspecified contact dermatitis, unspecified cause: Secondary | ICD-10-CM | POA: Diagnosis not present

## 2024-01-26 DIAGNOSIS — R21 Rash and other nonspecific skin eruption: Secondary | ICD-10-CM | POA: Diagnosis not present

## 2024-01-26 DIAGNOSIS — L932 Other local lupus erythematosus: Secondary | ICD-10-CM | POA: Diagnosis not present

## 2024-01-27 ENCOUNTER — Telehealth: Payer: Self-pay | Admitting: *Deleted

## 2024-01-27 DIAGNOSIS — C50612 Malignant neoplasm of axillary tail of left female breast: Secondary | ICD-10-CM | POA: Diagnosis not present

## 2024-01-27 DIAGNOSIS — C773 Secondary and unspecified malignant neoplasm of axilla and upper limb lymph nodes: Secondary | ICD-10-CM | POA: Diagnosis not present

## 2024-01-27 NOTE — Telephone Encounter (Signed)
 Per MD request RN placed call to pt with recent Guardant Reveal results being negative.  Pt educated and verbalized understanding.

## 2024-02-01 ENCOUNTER — Encounter: Payer: Self-pay | Admitting: Hematology and Oncology

## 2024-02-01 DIAGNOSIS — Z79899 Other long term (current) drug therapy: Secondary | ICD-10-CM | POA: Diagnosis not present

## 2024-02-01 DIAGNOSIS — H2513 Age-related nuclear cataract, bilateral: Secondary | ICD-10-CM | POA: Diagnosis not present

## 2024-02-01 DIAGNOSIS — H35373 Puckering of macula, bilateral: Secondary | ICD-10-CM | POA: Diagnosis not present

## 2024-02-16 ENCOUNTER — Encounter: Payer: Self-pay | Admitting: Hematology and Oncology

## 2024-02-17 ENCOUNTER — Encounter: Payer: Self-pay | Admitting: Hematology and Oncology

## 2024-02-19 ENCOUNTER — Encounter: Payer: Self-pay | Admitting: Hematology and Oncology

## 2024-02-23 ENCOUNTER — Ambulatory Visit
Admission: RE | Admit: 2024-02-23 | Discharge: 2024-02-23 | Discharge status: Home / Self Care | Payer: Self-pay | Source: Ambulatory Visit | Attending: Hematology and Oncology

## 2024-02-23 ENCOUNTER — Telehealth: Payer: Self-pay | Admitting: *Deleted

## 2024-02-23 ENCOUNTER — Encounter: Payer: Self-pay | Admitting: Hematology and Oncology

## 2024-02-23 DIAGNOSIS — C773 Secondary and unspecified malignant neoplasm of axilla and upper limb lymph nodes: Secondary | ICD-10-CM

## 2024-02-23 MED ORDER — GADOPICLENOL 0.5 MMOL/ML IV SOLN
6.0000 mL | Freq: Once | INTRAVENOUS | Status: AC | PRN
  Administered 2024-02-23: 6 mL via INTRAVENOUS

## 2024-02-23 NOTE — Telephone Encounter (Signed)
-----   Message from Noreene Filbert sent at 02/23/2024  2:03 PM EDT ----- MRI looks good.  NO cancer.  Please share the good news with the patient.    Thanks,   LC ----- Message ----- From: Interface, Rad Results In Sent: 02/23/2024  11:58 AM EDT To: Serena Croissant, MD

## 2024-02-23 NOTE — Telephone Encounter (Signed)
Per Lindsey Causey,DNP, called pt with message below. Pt verbalized understanding 

## 2024-03-08 ENCOUNTER — Telehealth: Admitting: Physician Assistant

## 2024-03-08 DIAGNOSIS — U071 COVID-19: Secondary | ICD-10-CM

## 2024-03-08 MED ORDER — ALBUTEROL SULFATE HFA 108 (90 BASE) MCG/ACT IN AERS
1.0000 | INHALATION_SPRAY | Freq: Four times a day (QID) | RESPIRATORY_TRACT | 0 refills | Status: DC | PRN
Start: 2024-03-08 — End: 2024-10-31

## 2024-03-08 MED ORDER — BENZONATATE 100 MG PO CAPS: 100.0000 mg | ORAL_CAPSULE | Freq: Three times a day (TID) | ORAL | 0 refills | Status: DC | PRN

## 2024-03-08 MED ORDER — NIRMATRELVIR/RITONAVIR (PAXLOVID)TABLET: 3.0000 | ORAL_TABLET | Freq: Two times a day (BID) | ORAL | 0 refills | Status: AC

## 2024-03-08 MED ORDER — PROMETHAZINE-DM 6.25-15 MG/5ML PO SYRP: 5.0000 mL | ORAL_SOLUTION | Freq: Four times a day (QID) | ORAL | 0 refills | Status: DC | PRN

## 2024-03-08 NOTE — Patient Instructions (Signed)
 Melissa [name], thank you for joining Melissa Loveless, PA-C for today's virtual visit.  While this provider is not your primary care provider (PCP), if your PCP is located in our provider database this encounter information will be shared with them immediately following your visit.   A [facility] MyChart account gives you access to today's visit and all your visits, tests, and labs performed at [facility] " click here if you don't have a [name] MyChart account or go to [email or web address]  Consent: (Patient) Melissa [name] provided verbal consent for this virtual visit at the beginning of the encounter.  Current Medications:  Current Outpatient Medications:    albuterol (VENTOLIN HFA) 108 (90 Base) MCG/ACT inhaler, Inhale 1-2 puffs into the lungs every 6 (six) hours as needed., Disp: 8 g, Rfl: 0   benzonatate (TESSALON) 100 MG capsule, Take 1-2 capsules (100-200 mg total) by mouth 3 (three) times daily as needed., Disp: 30 capsule, Rfl: 0   nirmatrelvir/ritonavir (PAXLOVID) 20 x 150 MG & 10 x 100MG  TABS, Take 3 tablets by mouth 2 (two) times daily for 5 days. (Take nirmatrelvir 150 mg two tablets twice daily for 5 days and ritonavir 100 mg one tablet twice daily for 5 days) Patient GFR is 90, Disp: 30 tablet, Rfl: 0   promethazine-dextromethorphan (PROMETHAZINE-DM) 6.25-15 MG/5ML syrup, Take 5 mLs by mouth 4 (four) times daily as needed., Disp: 118 mL, Rfl: 0   alendronate (FOSAMAX) 70 MG tablet, Take 1 tablet (70 mg total) by mouth once a week. Take with a full glass of water on an empty stomach., Disp: 12 tablet, Rfl: 3   ALPRAZolam (XANAX) 0.5 MG tablet, TAKE ONE TABLET BY MOUTH EVERY NIGHT AT BEDTIME AS NEEDED FOR ANXIETY, Disp: 30 tablet, Rfl: 3   anastrozole (ARIMIDEX) 1 MG tablet, TAKE 1 TABLET BY MOUTH DAILY, Disp: 90 tablet, Rfl: 3   calcium carbonate (OS-CAL) 1250 (500 Ca) MG chewable tablet, Chew 1 tablet by mouth daily. Calcium 600 mg, Disp: , Rfl:     cholecalciferol (VITAMIN D) 1000 units tablet, Take 1,000 Units by mouth daily. 2000 iu, Disp: , Rfl:    Hydroxychloroquine Sulfate 300 MG TABS, Take 1 tablet (300 mg total) by mouth daily at 12 noon., Disp: , Rfl:    rosuvastatin (CRESTOR) 10 MG tablet, Take 10 mg by mouth daily. , Disp: , Rfl: 0   triamterene-hydrochlorothiazide (MAXZIDE-25) 37.5-25 MG tablet, Take 1 tablet by mouth daily., Disp: , Rfl:    Medications ordered in this encounter:  Meds ordered this encounter  Medications   nirmatrelvir/ritonavir (PAXLOVID) 20 x 150 MG & 10 x 100MG  TABS    Sig: Take 3 tablets by mouth 2 (two) times daily for 5 days. (Take nirmatrelvir 150 mg two tablets twice daily for 5 days and ritonavir 100 mg one tablet twice daily for 5 days) Patient GFR is 90    Dispense:  30 tablet    Refill:  0    Supervising Provider:   Merrilee Jansky [[id number]]   albuterol (VENTOLIN HFA) 108 (90 Base) MCG/ACT inhaler    Sig: Inhale 1-2 puffs into the lungs every 6 (six) hours as needed.    Dispense:  8 g    Refill:  0    Supervising Provider:   Merrilee Jansky [[id number]]   promethazine-dextromethorphan (PROMETHAZINE-DM) 6.25-15 MG/5ML syrup    Sig: Take 5 mLs by mouth 4 (four) times daily as needed.    Dispense:  118 mL  Refill:  0    Supervising Provider:   Merrilee Jansky [[id number]]   benzonatate (TESSALON) 100 MG capsule    Sig: Take 1-2 capsules (100-200 mg total) by mouth 3 (three) times daily as needed.    Dispense:  30 capsule    Refill:  0    Supervising Provider:   Merrilee Jansky [[id number]]     *If you need refills on other medications prior to your next appointment, please contact your pharmacy*  Follow-Up: Call back or seek an in-person evaluation if the symptoms worsen or if the condition fails to improve as anticipated.  [facility] [phone]  Care Instructions: Can take to lessen severity: Vit C 500mg  twice daily Quercertin 250-500mg  twice daily Zinc 75-100mg   daily Melatonin 3-6 mg at bedtime Vit D3 1000-2000 IU daily Aspirin 81 mg daily with food Optional: Famotidine 20mg  daily Also can add tylenol/ibuprofen as needed for fevers and body aches May add Mucinex or Mucinex DM as needed for cough/congestion    Isolation Instructions: You are to isolate at home until you have been fever free for at least 24 hours without a fever-reducing medication, and symptoms have been steadily improving for 24 hours. At that time,  you can end isolation but need to mask for an additional 5 days.   If you must be around other household members who do not have symptoms, you need to make sure that both you and the family members are masking consistently with a high-quality mask.  If you note any worsening of symptoms despite treatment, please seek an in-person evaluation ASAP. If you note any significant shortness of breath or any chest pain, please seek ER evaluation. Please do not delay care!   COVID-19: What to Do if You Are Sick If you test positive and are an older adult or someone who is at high risk of getting very sick from COVID-19, treatment may be available. Contact a healthcare provider right away after a positive test to determine if you are eligible, even if your symptoms are mild right now. You can also visit a Test to Treat location and, if eligible, receive a prescription from a provider. Don't delay: Treatment must be started within the first few days to be effective. If you have a fever, cough, or other symptoms, you might have COVID-19. Most people have mild illness and are able to recover at home. If you are sick: Keep track of your symptoms. If you have an emergency warning sign (including trouble breathing), call 911. Steps to help prevent the spread of COVID-19 if you are sick If you are sick with COVID-19 or think you might have COVID-19, follow the steps below to care for yourself and to help protect other people in your home and  community. Stay home except to get medical care Stay home. Most people with COVID-19 have mild illness and can recover at home without medical care. Do not leave your home, except to get medical care. Do not visit public areas and do not go to places where you are unable to wear a mask. Take care of yourself. Get rest and stay hydrated. Take over-the-counter medicines, such as acetaminophen, to help you feel better. Stay in touch with your doctor. Call before you get medical care. Be sure to get care if you have trouble breathing, or have any other emergency warning signs, or if you think it is an emergency. Avoid public transportation, ride-sharing, or taxis if possible. Get tested If  you have symptoms of COVID-19, get tested. While waiting for test results, stay away from others, including staying apart from those living in your household. Get tested as soon as possible after your symptoms start. Treatments may be available for people with COVID-19 who are at risk for becoming very sick. Don't delay: Treatment must be started early to be effective--some treatments must begin within 5 days of your first symptoms. Contact your healthcare provider right away if your test result is positive to determine if you are eligible. Self-tests are one of several options for testing for the virus that causes COVID-19 and may be more convenient than laboratory-based tests and point-of-care tests. Ask your healthcare provider or your local health department if you need help interpreting your test results. You can visit your state, tribal, local, and territorial health department's website to look for the latest local information on testing sites. Separate yourself from other people As much as possible, stay in a specific room and away from other people and pets in your home. If possible, you should use a separate bathroom. If you need to be around other people or animals in or outside of the home, wear a well-fitting  mask. Tell your close contacts that they may have been exposed to COVID-19. An infected person can spread COVID-19 starting 48 hours (or 2 days) before the person has any symptoms or tests positive. By letting your close contacts know they may have been exposed to COVID-19, you are helping to protect everyone. See COVID-19 and Animals if you have questions about pets. If you are diagnosed with COVID-19, someone from the health department may call you. Answer the call to slow the spread. Monitor your symptoms Symptoms of COVID-19 include fever, cough, or other symptoms. Follow care instructions from your healthcare provider and local health department. Your local health authorities may give instructions on checking your symptoms and reporting information. When to seek emergency medical attention Look for emergency warning signs* for COVID-19. If someone is showing any of these signs, seek emergency medical care immediately: Trouble breathing Persistent pain or pressure in the chest New confusion Inability to wake or stay awake Pale, gray, or blue-colored skin, lips, or nail beds, depending on skin tone *This list is not all possible symptoms. Please call your medical provider for any other symptoms that are severe or concerning to you. Call 911 or call ahead to your local emergency facility: Notify the operator that you are seeking care for someone who has or may have COVID-19. Call ahead before visiting your doctor Call ahead. Many medical visits for routine care are being postponed or done by phone or telemedicine. If you have a medical appointment that cannot be postponed, call your doctor's office, and tell them you have or may have COVID-19. This will help the office protect themselves and other patients. If you are sick, wear a well-fitting mask You should wear a mask if you must be around other people or animals, including pets (even at home). Wear a mask with the best fit, protection, and  comfort for you. You don't need to wear the mask if you are alone. If you can't put on a mask (because of trouble breathing, for example), cover your coughs and sneezes in some other way. Try to stay at least 6 feet away from other people. This will help protect the people around you. Masks should not be placed on young children under age 75 years, anyone who has trouble breathing, or anyone who is  not able to remove the mask without help. Cover your coughs and sneezes Cover your mouth and nose with a tissue when you cough or sneeze. Throw away used tissues in a lined trash can. Immediately wash your hands with soap and water for at least 20 seconds. If soap and water are not available, clean your hands with an alcohol-based hand sanitizer that contains at least 60% alcohol. Clean your hands often Wash your hands often with soap and water for at least 20 seconds. This is especially important after blowing your nose, coughing, or sneezing; going to the bathroom; and before eating or preparing food. Use hand sanitizer if soap and water are not available. Use an alcohol-based hand sanitizer with at least 60% alcohol, covering all surfaces of your hands and rubbing them together until they feel dry. Soap and water are the best option, especially if hands are visibly dirty. Avoid touching your eyes, nose, and mouth with unwashed hands. Handwashing Tips Avoid sharing personal household items Do not share dishes, drinking glasses, cups, eating utensils, towels, or bedding with other people in your home. Wash these items thoroughly after using them with soap and water or put in the dishwasher. Clean surfaces in your home regularly Clean and disinfect high-touch surfaces (for example, doorknobs, tables, handles, light switches, and countertops) in your "sick room" and bathroom. In shared spaces, you should clean and disinfect surfaces and items after each use by the person who is ill. If you are sick and  cannot clean, a caregiver or other person should only clean and disinfect the area around you (such as your bedroom and bathroom) on an as needed basis. Your caregiver/other person should wait as long as possible (at least several hours) and wear a mask before entering, cleaning, and disinfecting shared spaces that you use. Clean and disinfect areas that may have blood, stool, or body fluids on them. Use household cleaners and disinfectants. Clean visible dirty surfaces with household cleaners containing soap or detergent. Then, use a household disinfectant. Use a product from [employer] List N: Disinfectants for Coronavirus (COVID-19). Be sure to follow the instructions on the label to ensure safe and effective use of the product. Many products recommend keeping the surface wet with a disinfectant for a certain period of time (look at "contact time" on the product label). You may also need to wear personal protective equipment, such as gloves, depending on the directions on the product label. Immediately after disinfecting, wash your hands with soap and water for 20 seconds. For completed guidance on cleaning and disinfecting your home, visit Complete Disinfection Guidance. Take steps to improve ventilation at home Improve ventilation (air flow) at home to help prevent from spreading COVID-19 to other people in your household. Clear out COVID-19 virus particles in the air by opening windows, using air filters, and turning on fans in your home. Use this interactive tool to learn how to improve air flow in your home. When you can be around others after being sick with COVID-19 Deciding when you can be around others is different for different situations. Find out when you can safely end home isolation. For any additional questions about your care, contact your healthcare provider or state or local health department. 02/25/2021 Content source: [facility] for Immunization and Respiratory Diseases  ([facility]), Division of Viral Diseases This information is not intended to replace advice given to you by your health care provider. Make sure you discuss any questions you have with your health care provider. Document  Revised: 04/10/2021 Document Reviewed: 04/10/2021 Elsevier Patient Education  2022 [employer].     If you have been instructed to have an in-person evaluation today at a local Urgent Care facility, please use the link below. It will take you to a list of all of our available [facility], including address, phone number and hours of operation. Please do not delay care.  [facility]  If you or a family member do not have a primary care provider, use the link below to schedule a visit and establish care. When you choose a [facility] primary care physician or advanced practice provider, you gain a long-term partner in health. Find a Primary Care Provider  Learn more about [facility]'s in-office and virtual care options: [facility] - Get Care Now

## 2024-03-08 NOTE — Progress Notes (Signed)
 Virtual Visit Consent   Melissa [name], you are scheduled for a virtual visit with a [facility] provider today. Just as with appointments in the office, your consent must be obtained to participate. Your consent will be active for this visit and any virtual visit you may have with one of our providers in the next 365 days. If you have a MyChart account, a copy of this consent can be sent to you electronically.  As this is a virtual visit, video technology does not allow for your provider to perform a traditional examination. This may limit your provider's ability to fully assess your condition. If your provider identifies any concerns that need to be evaluated in person or the need to arrange testing (such as labs, EKG, etc.), we will make arrangements to do so. Although advances in technology are sophisticated, we cannot ensure that it will always work on either your end or our end. If the connection with a video visit is poor, the visit may have to be switched to a telephone visit. With either a video or telephone visit, we are not always able to ensure that we have a secure connection.  By engaging in this virtual visit, you consent to the provision of healthcare and authorize for your insurance to be billed (if applicable) for the services provided during this visit. Depending on your insurance coverage, you may receive a charge related to this service.  I need to obtain your verbal consent now. Are you willing to proceed with your visit today? Melissa [name] has provided verbal consent on 03/08/2024 for a virtual visit (video or telephone). [name], PA-C  Date: 03/08/2024 9:49 AM   Virtual Visit via Video Note   I, [name], connected with  Melissa [name]  ([id number], Feb 20, 1961) on 03/08/24 at  9:45 AM EDT by a video-enabled telemedicine application and verified that I am speaking with the correct person using two identifiers.  Location: Patient: Virtual Visit Location  Patient: Home Provider: Virtual Visit Location Provider: Home Office   I discussed the limitations of evaluation and management by telemedicine and the availability of in person appointments. The patient expressed understanding and agreed to proceed.    History of Present Illness: Melissa [name] is a 63 y.o. who identifies as a female who was assigned female at birth, and is being seen today for Covid 65.  HPI: URI  This is a new problem. The current episode started in the past 7 days (Tested positive for Covid 19 this morning;  Symptoms started last Thursday, 03/02/24, felt like allergies, worsened last night; Had 2 other Covid test over the weekend, as her husband had Covid last week, that were all negative; tested positive today). The problem has been gradually worsening. There has been no fever. Associated symptoms include chest pain (tightness), congestion, coughing, headaches, rhinorrhea (and post nasal drainage), sinus pain (mild; frontal), a sore throat (felt swollen last night) and wheezing. Pertinent negatives include no diarrhea, ear pain, nausea, plugged ear sensation or vomiting. Associated symptoms comments: Body aches overnight. Treatments tried: zyrtec, flonase. The treatment provided no relief.     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 (Take nirmatrelvir 150 mg two tablets twice daily for 5 days and ritonavir 100 mg one tablet twice daily for 5 days) Patient GFR is 90, Disp: 30 tablet, Rfl: 0   promethazine-dextromethorphan (PROMETHAZINE-DM) 6.25-15 MG/5ML syrup, Take 5 mLs by mouth 4 (four) times daily as needed., Disp: 118 mL, Rfl: 0   alendronate (FOSAMAX) 70 MG tablet, Take 1 tablet (70 mg total) by mouth once a week. Take with a full glass of water on an empty stomach., Disp: 12 tablet, Rfl: 3   ALPRAZolam (XANAX) 0.5 MG tablet, TAKE ONE TABLET BY MOUTH EVERY NIGHT AT BEDTIME AS NEEDED FOR ANXIETY, Disp: 30 tablet, Rfl: 3   anastrozole (ARIMIDEX) 1 MG tablet, TAKE 1 TABLET BY MOUTH DAILY, Disp: 90 tablet, Rfl: 3   calcium carbonate (OS-CAL) 1250 (500 Ca) MG chewable tablet, Chew 1 tablet by mouth daily. Calcium 600 mg, Disp: , Rfl:    cholecalciferol (VITAMIN D) 1000 units tablet, Take 1,000 Units by mouth daily. 2000 iu, Disp: , Rfl:    Hydroxychloroquine Sulfate 300 MG TABS, Take 1 tablet (300 mg total) by mouth daily at 12 noon., Disp: , Rfl:    rosuvastatin (CRESTOR) 10 MG tablet, Take 10 mg by mouth daily. , Disp: , Rfl: 0   triamterene-hydrochlorothiazide (MAXZIDE-25) 37.5-25 MG tablet, Take 1 tablet by mouth daily., Disp: , Rfl:   Observations/Objective: Patient is well-developed, well-nourished in no acute distress.  Resting comfortably at home.  Head is normocephalic, atraumatic.  No labored breathing.  Speech is clear and coherent with logical content.   Patient is alert and oriented at baseline.    Assessment and Plan: 1. COVID-19 (Primary) - MyChart COVID-19 home monitoring program; Future - nirmatrelvir/ritonavir (PAXLOVID) 20 x 150 MG & 10 x 100MG  TABS; Take 3 tablets by mouth 2 (two) times daily for 5 days. (Take nirmatrelvir 150 mg two tablets twice daily for 5 days and ritonavir 100 mg one tablet twice daily for 5 days) Patient GFR is 90  Dispense: 30 tablet; Refill: 0 - albuterol (VENTOLIN HFA) 108 (90 Base) MCG/ACT inhaler; Inhale 1-2 puffs into the lungs every 6 (six) hours as needed.  Dispense: 8 g; Refill: 0 - promethazine-dextromethorphan (PROMETHAZINE-DM) 6.25-15 MG/5ML syrup; Take 5 mLs by mouth 4 (four) times daily as needed.  Dispense: 118 mL; Refill: 0 - benzonatate (TESSALON) 100 MG capsule; Take 1-2 capsules (100-200 mg total) by mouth 3 (three) times daily as needed.  Dispense: 30 capsule; Refill: 0  - Continue OTC symptomatic management of choice - Will send OTC vitamins and supplement information through AVS - Paxlovid prescribed - Promethazine DM and Tessalon perles for cough - Albuterol for wheezing  and chest tightness - Patient enrolled in MyChart symptom monitoring - Push fluids - Rest as needed - Discussed return precautions and when to seek in-person evaluation, sent via AVS as well   Follow Up Instructions: I discussed the assessment and treatment plan with the patient. The patient was provided an opportunity to ask questions and all were answered. The patient agreed with the plan and demonstrated an understanding of the instructions.  A copy of instructions were sent to the patient via MyChart unless otherwise noted below.    The patient was advised to call back or seek an in-person evaluation if the symptoms worsen or if the condition fails to improve as anticipated.    [name], PA-C

## 2024-03-17 ENCOUNTER — Telehealth: Admitting: Physician Assistant

## 2024-03-17 DIAGNOSIS — L03213 Periorbital cellulitis: Secondary | ICD-10-CM

## 2024-03-17 MED ORDER — AMOXICILLIN-POT CLAVULANATE 875-125 MG PO TABS: 1.0000 | ORAL_TABLET | Freq: Two times a day (BID) | ORAL | 0 refills | Status: DC

## 2024-03-17 MED ORDER — OFLOXACIN 0.3 % OP SOLN: 1.0000 [drp] | Freq: Four times a day (QID) | OPHTHALMIC | 0 refills | Status: AC

## 2024-03-17 NOTE — Patient Instructions (Signed)
 [name], thank you for joining [name], PA-C for today's virtual visit.  While this provider is not your primary care provider (PCP), if your PCP is located in our provider database this encounter information will be shared with them immediately following your visit.   A [facility] MyChart account gives you access to today's visit and all your visits, tests, and labs performed at [facility] " click here if you don't have a [facility] MyChart account or go to [email or web address]  Consent: (Patient) Melissa [name] provided verbal consent for this virtual visit at the beginning of the encounter.  Current Medications:  Current Outpatient Medications:    amoxicillin-clavulanate (AUGMENTIN) 875-125 MG tablet, Take 1 tablet by mouth 2 (two) times daily., Disp: 14 tablet, Rfl: 0   ofloxacin (OCUFLOX) 0.3 % ophthalmic solution, Place 1 drop into the right eye 4 (four) times daily for 5 days., Disp: 5 mL, Rfl: 0   albuterol (VENTOLIN HFA) 108 (90 Base) MCG/ACT inhaler, Inhale 1-2 puffs into the lungs every 6 (six) hours as needed., Disp: 8 g, Rfl: 0   alendronate (FOSAMAX) 70 MG tablet, Take 1 tablet (70 mg total) by mouth once a week. Take with a full glass of water on an empty stomach., Disp: 12 tablet, Rfl: 3   ALPRAZolam (XANAX) 0.5 MG tablet, TAKE ONE TABLET BY MOUTH EVERY NIGHT AT BEDTIME AS NEEDED FOR ANXIETY, Disp: 30 tablet, Rfl: 3   anastrozole (ARIMIDEX) 1 MG tablet, TAKE 1 TABLET BY MOUTH DAILY, Disp: 90 tablet, Rfl: 3   benzonatate (TESSALON) 100 MG capsule, Take 1-2 capsules (100-200 mg total) by mouth 3 (three) times daily as needed., Disp: 30 capsule, Rfl: 0   calcium carbonate (OS-CAL) 1250 (500 Ca) MG chewable tablet, Chew 1 tablet by mouth daily. Calcium 600 mg, Disp: , Rfl:    cholecalciferol (VITAMIN D) 1000 units tablet, Take 1,000 Units by mouth daily. 2000 iu, Disp: , Rfl:    Hydroxychloroquine Sulfate 300 MG TABS, Take 1 tablet (300 mg  total) by mouth daily at 12 noon., Disp: , Rfl:    promethazine-dextromethorphan (PROMETHAZINE-DM) 6.25-15 MG/5ML syrup, Take 5 mLs by mouth 4 (four) times daily as needed., Disp: 118 mL, Rfl: 0   rosuvastatin (CRESTOR) 10 MG tablet, Take 10 mg by mouth daily. , Disp: , Rfl: 0   triamterene-hydrochlorothiazide (MAXZIDE-25) 37.5-25 MG tablet, Take 1 tablet by mouth daily., Disp: , Rfl:    Medications ordered in this encounter:  Meds ordered this encounter  Medications   amoxicillin-clavulanate (AUGMENTIN) 875-125 MG tablet    Sig: Take 1 tablet by mouth 2 (two) times daily.    Dispense:  14 tablet    Refill:  0    Supervising Provider:   Merrilee Jansky [[id number]]   ofloxacin (OCUFLOX) 0.3 % ophthalmic solution    Sig: Place 1 drop into the right eye 4 (four) times daily for 5 days.    Dispense:  5 mL    Refill:  0    Supervising Provider:   Merrilee Jansky [[id number]]     *If you need refills on other medications prior to your next appointment, please contact your pharmacy*  Follow-Up: Call back or seek an in-person evaluation if the symptoms worsen or if the condition fails to improve as anticipated.  [facility] [phone]  Other Instructions  Infection of the Eyelid and Nearby Skin (Preseptal Cellulitis) in Adults: What to Know  An infection of your eyelid and  nearby skin can cause painful swelling and redness. This type of infection is called preseptal cellulitis or periorbital cellulitis. In most cases, it can be treated with antibiotics at home. But it should be treated right away to stop the infection from getting worse. If it gets worse, it can spread to your eye socket and eye muscles. This can cause a serious problem called orbital cellulitis. What are the causes? Preseptal cellulitis is often caused by a germ called bacteria. In rare cases, it can be caused by a germ called a virus or by a fungus. These germs may come from: A sinus infection that spreads  near your eyes. An injury near your eye. This may be: A scratch. A puncture wound. An animal bite. An insect bite. A skin rash, such as eczema or poison ivy, that gets infected. An infected pimple on your eyelid. This is called a stye. An infection after surgery on your eyelid. What increases the risk? You're more likely to get this type of infection if: Your body's defense system (immune system) is weak. You have a condition that makes you more likely to get sinus infections. What are the signs or symptoms? Symptoms may start all of a sudden. They may include: Eyelids that are: Red. Swollen. Painful. Tender. Too hot. A fever. Trouble opening your eye. A headache. Pain in your face. How is this diagnosed? Preseptal cellulitis may be diagnosed based on your symptoms, your medical history, and an eye exam. You may also have tests, such as: Blood tests. Cultures. These are tests to find out which type of germ is causing the infection. A CT scan. An MRI. This is less common. How is this treated? Preseptal cellulitis is treated with antibiotics. These may be given: By mouth. Through an IV. As a shot. In rare cases, you may need surgery to drain an infected area. Follow these instructions at home: Medicines Take your antibiotics as told. Do not stop taking them even if you start to feel better. Take your medicines only as told. Do not use eye drops without talking to your health care provider first. Eye Care Do not touch or rub your eye. If you wear contact lenses, do not wear them until your provider says it's OK. Keep your eye clean and dry. Wash your eye. Use: A clean washcloth. Warm water. Baby shampoo or mild soap. To help with discomfort, put a clean, wet washcloth on your eye. Leave it on for a few minutes. General instructions Wash your hands with soap and water often for at least 20 seconds. If you can't use soap and water, use hand sanitizer. Do not smoke, vape,  or use nicotine or tobacco. Drink more fluids as told. Ask when it's safe to drive or use machines. Stay up to date on your vaccine shots. Keep all follow-up visits. These include any visits with a dentist or an eye specialist called an ophthalmologist. Get help right away if: You have new symptoms, or your symptoms get worse. Your symptoms don't get better with treatment. You have a fever. Your eyesight gets blurry or gets worse in any way. You start to see double of everything. Your eye looks like it's sticking out or bulging out. You have trouble moving your eyes or pain when you move your eyes. You have a very bad headache. You have very bad neck pain, or your neck gets stiff. These symptoms may be an emergency. Call 911 right away. Do not wait to see if the symptoms  will go away. Do not drive yourself to the hospital. This information is not intended to replace advice given to you by your health care provider. Make sure you discuss any questions you have with your health care provider. Document Revised: 05/21/2023 Document Reviewed: 05/21/2023 Elsevier Patient Education  2024 Elsevier [employer].   If you have been instructed to have an in-person evaluation today at a local Urgent Care facility, please use the link below. It will take you to a list of all of our available [facility], including address, phone number and hours of operation. Please do not delay care.  [facility]  If you or a family member do not have a primary care provider, use the link below to schedule a visit and establish care. When you choose a [facility] primary care physician or advanced practice provider, you gain a long-term partner in health. Find a Primary Care Provider  Learn more about [facility]'s in-office and virtual care options: [facility] Get Care Now

## 2024-03-17 NOTE — Progress Notes (Signed)
 Virtual Visit Consent   Melissa [name], you are scheduled for a virtual visit with a [facility] provider today. Just as with appointments in the office, your consent must be obtained to participate. Your consent will be active for this visit and any virtual visit you may have with one of our providers in the next 365 days. If you have a MyChart account, a copy of this consent can be sent to you electronically.  As this is a virtual visit, video technology does not allow for your provider to perform a traditional examination. This may limit your provider's ability to fully assess your condition. If your provider identifies any concerns that need to be evaluated in person or the need to arrange testing ([name] as labs, EKG, etc.), we will make arrangements to do so. Although advances in technology are sophisticated, we cannot ensure that it will always work on either your end or our end. If the connection with a video visit is poor, the visit may have to be switched to a telephone visit. With either a video or telephone visit, we are not always able to ensure that we have a secure connection.  By engaging in this virtual visit, you consent to the provision of healthcare and authorize for your insurance to be billed (if applicable) for the services provided during this visit. Depending on your insurance coverage, you may receive a charge related to this service.  I need to obtain your verbal consent now. Are you willing to proceed with your visit today? Melissa [name] has provided verbal consent on 03/17/2024 for a virtual visit (video or telephone). [name], PA-C  Date: 03/17/2024 12:07 PM   Virtual Visit via Video Note   I, [name], connected with  Melissa [name]  ([id number], 1961/03/26) on 03/17/24 at 12:00 PM EDT by a video-enabled telemedicine application and verified that I am speaking with the correct person using two identifiers.  Location: Patient: Virtual Visit  Location Patient: Home Provider: Virtual Visit Location Provider: Home Office   I discussed the limitations of evaluation and management by telemedicine and the availability of in person appointments. The patient expressed understanding and agreed to proceed.    History of Present Illness: Melissa [name] is a 63 y.o. who identifies as a female who was assigned female at birth, and is being seen today for right eye redness and swelling.  HPI: Conjunctivitis  The current episode started yesterday. Onset quality: Had Covid 19 diagnosed on 03/08/24, treated with Paxlovid; Right eye started yesterday. The problem occurs rarely. The problem has been gradually worsening. The problem is mild. Nothing relieves the symptoms. Nothing aggravates the symptoms. Associated symptoms include congestion, headaches, sore throat (mild; scratchy) and eye redness. Pertinent negatives include no fever, no decreased vision, no double vision, no eye itching, no photophobia, no rhinorrhea, no eye discharge and no eye pain. The eye pain is mild. The right eye is affected. The eye pain is not associated with movement. The eyelid exhibits swelling and redness.     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 Take with a full glass of water on an empty stomach., Disp: 12 tablet, Rfl: 3   ALPRAZolam (XANAX) 0.5 MG tablet, TAKE ONE TABLET BY MOUTH EVERY NIGHT AT BEDTIME AS NEEDED FOR ANXIETY, Disp: 30 tablet, Rfl: 3   amoxicillin-clavulanate (AUGMENTIN) 875-125 MG tablet, Take 1 tablet by mouth 2 (two) times daily., Disp: 14 tablet, Rfl: 0   anastrozole (ARIMIDEX) 1 MG tablet, TAKE 1 TABLET BY MOUTH DAILY, Disp: 90 tablet, Rfl: 3   benzonatate (TESSALON) 100 MG capsule, Take 1-2 capsules (100-200 mg total) by mouth 3 (three) times daily as needed., Disp: 30 capsule, Rfl: 0   calcium carbonate (OS-CAL) 1250 (500 Ca) MG chewable tablet, Chew 1 tablet by mouth daily. Calcium 600 mg, Disp: , Rfl:    cholecalciferol (VITAMIN D) 1000 units tablet, Take 1,000 Units by mouth daily. 2000 iu, Disp: , Rfl:    Hydroxychloroquine Sulfate 300 MG TABS, Take 1 tablet (300 mg total) by mouth daily at 12 noon., Disp: , Rfl:    ofloxacin (OCUFLOX) 0.3 % ophthalmic solution, Place 1 drop into the right eye 4 (four) times daily for 5 days., Disp: 5 mL, Rfl: 0   promethazine-dextromethorphan (PROMETHAZINE-DM) 6.25-15 MG/5ML syrup, Take 5 mLs by mouth 4 (four) times daily as needed., Disp: 118 mL, Rfl: 0   rosuvastatin (CRESTOR) 10 MG tablet, Take 10 mg by mouth daily. , Disp: , Rfl: 0   triamterene-hydrochlorothiazide (MAXZIDE-25) 37.5-25 MG tablet, Take 1 tablet by mouth daily., Disp: , Rfl:   Observations/Objective: Patient is well-developed, well-nourished in no acute distress.  Resting comfortably at home.  Head is normocephalic, atraumatic.  No labored breathing.  Speech is clear and coherent with logical content.  Patient is alert and oriented at baseline.  Right lower eyelid has a pocket of swelling then redness  extends on the lwoer eyelid into the upper cheek on the right   Assessment and Plan: 1. Preseptal cellulitis of right lower eyelid (Primary) - amoxicillin-clavulanate (AUGMENTIN) 875-125 MG tablet; Take 1 tablet by mouth 2 (two) times daily.  Dispense: 14 tablet; Refill: 0 - ofloxacin (OCUFLOX) 0.3 % ophthalmic solution; Place 1 drop into the right eye 4 (four) times daily for 5 days.  Dispense: 5 mL; Refill: 0  - Suspect preseptal cellulitis of the right eye - Augmentin and Ofloxacin eye drops prescribed - Warm compresses - Good hand hygiene - Seek in person evaluation if symptoms worsen or fail to improve   Follow Up Instructions: I discussed the assessment and treatment plan with the patient. The patient was provided an opportunity to ask questions and all were answered. The patient agreed with the plan and demonstrated an understanding of the instructions.  A copy of instructions were sent to the patient via MyChart unless otherwise noted below.    The patient was advised to call back or seek an in-person evaluation if the symptoms worsen or if the condition fails to improve as anticipated.  [name], PA-C

## 2024-03-28 ENCOUNTER — Telehealth: Admitting: Sports Medicine

## 2024-06-28 ENCOUNTER — Encounter: Payer: Self-pay | Admitting: Registered Nurse

## 2024-07-22 ENCOUNTER — Other Ambulatory Visit: Payer: Self-pay | Admitting: Hematology and Oncology

## 2024-08-02 ENCOUNTER — Other Ambulatory Visit: Payer: Self-pay | Admitting: Hematology and Oncology

## 2024-08-02 DIAGNOSIS — Z1231 Encounter for screening mammogram for malignant neoplasm of breast: Secondary | ICD-10-CM

## 2024-08-08 ENCOUNTER — Encounter: Payer: Self-pay | Admitting: Sports Medicine

## 2024-08-08 ENCOUNTER — Encounter: Payer: Self-pay | Admitting: Hematology and Oncology

## 2024-08-25 ENCOUNTER — Ambulatory Visit
Admission: RE | Admit: 2024-08-25 | Discharge: 2024-08-25 | Disposition: A | Discharge status: Home / Self Care | Source: Ambulatory Visit | Attending: Hematology and Oncology | Admitting: Hematology and Oncology

## 2024-08-25 DIAGNOSIS — Z1231 Encounter for screening mammogram for malignant neoplasm of breast: Secondary | ICD-10-CM

## 2024-10-08 ENCOUNTER — Encounter: Payer: Self-pay | Admitting: Hematology and Oncology

## 2024-10-23 ENCOUNTER — Ambulatory Visit: Payer: Self-pay | Admitting: Hematology and Oncology

## 2024-10-31 ENCOUNTER — Inpatient Hospital Stay: Attending: Hematology and Oncology | Admitting: Hematology and Oncology

## 2024-10-31 VITALS — BP 145/74 | HR 78 | Temp 97.4°F | Resp 17 | Wt 146.5 lb

## 2024-10-31 DIAGNOSIS — C773 Secondary and unspecified malignant neoplasm of axilla and upper limb lymph nodes: Secondary | ICD-10-CM

## 2024-10-31 MED ORDER — ALPRAZOLAM 0.5 MG PO TABS: 0.5000 mg | ORAL_TABLET | Freq: Every evening | ORAL | 1 refills | Status: AC | PRN

## 2024-10-31 NOTE — Assessment & Plan Note (Signed)
 04/14/2017 Left axillary lymph node biopsy: Metastatic carcinoma positive for CK 7, ER positive,GATA-3 equivocal, negative for CK 20,GCDFP, CDX-2, TTF-1; HER-2 positive ratio 2.08 mammogram and ultrasound revealed 3 discrete abnormal left axillary lymph nodes largest measures 1.8 cm Clinical stage: TX N1 MX   Treatment plan: 1. Neoadjuvant TCH Perjeta  6 cycles completed 08/31/2017 followed by Herceptin , Perjeta  maintenance for 1 year 2. followed by axillary lymph node dissection. Breast surgery will not be performed because there is no primary tumor identifiable on breast MRI: 10/14/17: 0/13 LN Negative 3. Followed by radiation 12/08/2017-01/12/2018 4. Followed by antiestrogen therapy started 01/30/2018 5. Herceptin  Perjeta  maintenance completed 05/10/2018 6. Neratinib  started 05/13/2018 discontinued 04/03/2019 ------------------------------------------------------------------------------------------------------------------ Current treatment: Anastrozole  started 01/30/2018 Anastrozole  toxicities: Tolerating it reasonably well.  She does have some joint stiffness in the morning but she gets better when she moves. Occasional hot flashes but they are much better than before.   Breast cancer surveillance: 1.  Breast exam 10/31/2024: Benign, 2. Mammograms 08/29/2024: Benign, density category C 3.  Breast MRI 02/23/2024: Benign breast density category C  Bone density 01/13/2024: T-score -1.7: Osteopenia: Calcium vitamin D and weightbearing exercises   Patient underwent a shoulder replacement surgery. 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 Return to clinic in 1 year for follow-up

## 2024-10-31 NOTE — Progress Notes (Unsigned)
 Patient Care Team: Clarice Nottingham, MD as PCP - General (Internal Medicine)  DIAGNOSIS:  Encounter Diagnosis  Name Primary?   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)  04/14/2017 Initial Diagnosis   Left axillary lymph node biopsy: Metastatic carcinoma positive for CK 7, ER positive,GATA-3 equivocal, negative for CK 20,GCDFP, CDX-2, TTF-1; HER-2 positive ratio 2.08; mammogram and ultrasound revealed 3 discrete abnormal left axillary lymph nodes largest measures 1.8 cm   04/23/2017 Breast MRI   Left axillary lymphadenopathy numerous enlarged level I axillary lymph nodes largest 2.1 cm, no other abdominal masses in the left breast itself, no MRI evidence of malignancy in the right breast   04/26/2017 Imaging   CT CAP: Prominent left axillary lymph nodes no distant metastases, sclerotic focus left pedicle of T9 vertebra favored benign, T5 vertebral hemangioma   05/12/2017 - 08/31/2017 Neo-Adjuvant Chemotherapy   TCH Perjeta  x6 followed by Herceptin  Perjeta  maintenance for 1 year   10/14/2017 Surgery   Left axillary lymph node dissection: 0/13 lymph nodes complete pathologic response.  Breast was not operated on because no breast primary was ever identified   12/09/2017 - 01/11/2018 Radiation Therapy   Adjuvant radiation therapy   01/30/2018 -  Anti-estrogen oral therapy   Anastrozole  1 mg daily, Neratinib  started 05/13/2018   09/09/2018 Genetic Testing   The MyRisk gene panel offered by [facility] includes sequencing and deletion/duplication testing of the following 35 genes: APC, ATM, BARD1, BMPR1A, BRCA1, BRCA2, BRIP1, CHD1, CDK4, CDKN2A, CHEK2, EPCAM (large rearrangement only), GREM1, HOXB13, AXIN2, MSH3, NTHL1, RNF43, GALNT12, RSP20, MLH1, MSH2, MSH6, MUTYH, NBN, PALB2, PMS2, PTEN, RAD51C,  RAD51D, SMAD4, STK11, and TP53. Sequencing was performed for select regions of POLE and POLD1, and large rearrangement analysis was performed for select regions of GREM1.  Results: Negative, no pathogenic variants identified.  The date of this test report is 09/09/2018.     CHIEF COMPLIANT: Surveillance of breast cancer  HISTORY OF PRESENT ILLNESS:  History of Present Illness Melissa [name] is a 63 year old female with osteoporosis who presents with concerns about bone health and medication management. She is accompanied by a family member, though her name and relation are not specified.  She has a T-score of -2.8 in her right forearm, with normal hip bone density. She is worried about how low bone density may affect current dental implant work.  She has taken hormone therapy for seven years and started anastrozole  in February 2019. She is considering stopping anastrozole  because of concern for bone loss. She was prescribed Fosamax  but is reluctant to start it while having dental procedures.  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 She notes that her mother had a similar condition and was able to stop hydroxychloroquine  without recurrence.  She uses Xanax  only during dental procedures for anxiety and has not needed a refill in over a year and a half.  She mentions planning a large family gathering but has no functional limitations related to her bone health or oncology medications.     ALLERGIES:  has no known allergies.  MEDICATIONS:  Current Outpatient Medications  Medication Sig Dispense Refill   anastrozole  (ARIMIDEX ) 1 MG tablet TAKE 1 TABLET BY MOUTH DAILY 90 tablet 3   calcium carbonate (OS-CAL) 1250 (500  Ca) MG chewable tablet Chew 1 tablet by mouth daily. Calcium 600 mg     cholecalciferol (VITAMIN D) 1000 units tablet Take 1,000 Units by mouth daily. 2000 iu     Hydroxychloroquine  Sulfate  300 MG TABS Take 1 tablet (300 mg total) by mouth daily at 12 noon.     rosuvastatin  (CRESTOR ) 10 MG tablet Take 10 mg by mouth daily.   0   triamterene -hydrochlorothiazide (MAXZIDE -25) 37.5-25 MG tablet Take 1 tablet by mouth daily.     ALPRAZolam  (XANAX ) 0.5 MG tablet Take 1 tablet (0.5 mg total) by mouth at bedtime as needed for anxiety. 30 tablet 1   No current facility-administered medications for this visit.    PHYSICAL EXAMINATION: ECOG PERFORMANCE STATUS: 1 - Symptomatic but completely ambulatory  Vitals:   10/31/24 1528  BP: (!) 145/74  Pulse: 78  Resp: 17  Temp: (!) 97.4 F (36.3 C)  SpO2: 100%   Filed Weights   10/31/24 1528  Weight: 146 lb 8 oz (66.5 kg)     LABORATORY DATA:  I have reviewed the data as listed    Latest Ref Rng & Units 03/01/2019   10:29 AM 01/30/2019   11:32 AM 12/01/2018   10:19 AM  CMP  Glucose 70 - 99 mg/dL 87  96  93   BUN 6 - 20 mg/dL 22  20  17    Creatinine 0.44 - 1.00 mg/dL 9.14  8.95  8.99   Sodium 135 - 145 mmol/L 139  140  141   Potassium 3.5 - 5.1 mmol/L 3.5  4.0  4.1   Chloride 98 - 111 mmol/L 98  101  105   CO2 22 - 32 mmol/L 30  26  28    Calcium 8.9 - 10.3 mg/dL 9.6  9.9  9.8   Total Protein 6.5 - 8.1 g/dL 7.6  7.7  6.9   Total Bilirubin 0.3 - 1.2 mg/dL 0.4  0.5  0.3   Alkaline Phos 38 - 126 U/L 89  93  97   AST 15 - 41 U/L 15  21  19    ALT 0 - 44 U/L 17  16  16      Lab Results  Component Value Date   WBC 6.1 03/01/2019   HGB 14.6 03/01/2019   HCT 43.7 03/01/2019   MCV 90.7 03/01/2019   PLT 352 03/01/2019   NEUTROABS 2.9 03/01/2019    ASSESSMENT & PLAN:  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 Neoadjuvant TCH Perjeta  6 cycles completed 08/31/2017 followed by Herceptin , Perjeta  maintenance for 1 year 2. followed by axillary lymph node dissection. Breast surgery will not be performed because there is no primary tumor identifiable on breast MRI: 10/14/17: 0/13 LN Negative 3. Followed by radiation 12/08/2017-01/12/2018 4. Followed by antiestrogen therapy started 01/30/2018 5. Herceptin  Perjeta  maintenance completed 05/10/2018 6. Neratinib  started 05/13/2018 discontinued 04/03/2019 ------------------------------------------------------------------------------------------------------------------ Current treatment: Anastrozole  started 01/30/2018 will complete February 2026 Anastrozole  toxicities: Tolerating it reasonably well.  She does have some joint stiffness in the morning but she gets better when she moves. Occasional hot flashes but they are much better than before. I discussed with her about discontinuing anastrozole  therapy  in February 2026.  After much reluctance she is agreeable.   Breast cancer surveillance: 1.  Breast exam 10/31/2024: Benign, 2. Mammograms 08/29/2024: Benign, density category C 3.  Breast MRI 02/23/2024: Benign breast density category C  Bone density 01/13/2024: T-score -1.7: Osteopenia: Calcium vitamin D and weightbearing exercises   Patient underwent a shoulder replacement surgery. 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 Considering discontinuation due to bone health concerns and lack of additional benefit beyond seven years. [id number] study supports this decision. She prefers to stop anastrozole . - Discontinue anastrozole  after  current supply is finished. - Continue annual mammograms and biennial breast MRIs for surveillance. - Continue regular blood work every six months for early detection of recurrence.  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 Hips normal. Discontinuation of anastrozole  may stabilize or improve bone health. She prefers not to take Fosamax  due to dental work. - Discontinued Fosamax  due to ongoing dental work and preference. - Encouraged weight-bearing exercises to improve bone health. - Order bone density scan every two years.  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 No recent flare-ups. [place] exposure is a known trigger. - Continue hydroxychloroquine  300 mg daily. - Monitor for flare-ups and consider discussing discontinuation if no flare-ups occur for a year.      No orders of the defined types were placed in this encounter.  The patient has a good understanding of the overall plan. she agrees with it. she will call with any problems that may develop before the next visit here.  I personally spent a total of 30 minutes in the care of the patient today including preparing to see the patient, getting/reviewing separately obtained history, performing a medically appropriate exam/evaluation, counseling and educating, placing orders, referring and communicating with other health care professionals, documenting clinical information in the EHR, independently interpreting results, communicating results, and coordinating care.   Viinay K Estela Vinal, MD 11/01/24

## 2024-11-01 ENCOUNTER — Encounter: Payer: Self-pay | Admitting: Hematology and Oncology

## 2024-11-08 ENCOUNTER — Encounter: Payer: Self-pay | Admitting: Hematology and Oncology

## 2025-10-29 ENCOUNTER — Inpatient Hospital Stay: Admitting: Hematology and Oncology
